# Patient Record
Sex: Female | Born: 1935
Health system: Southern US, Community
[De-identification: ages and names within clinical notes are randomized; demographics above are authoritative.]

## PROBLEM LIST (undated history)

## (undated) DIAGNOSIS — I8390 Asymptomatic varicose veins of unspecified lower extremity: Secondary | ICD-10-CM

## (undated) DIAGNOSIS — C50912 Malignant neoplasm of unspecified site of left female breast: Secondary | ICD-10-CM

## (undated) DIAGNOSIS — Z87891 Personal history of nicotine dependence: Secondary | ICD-10-CM

## (undated) DIAGNOSIS — C4491 Basal cell carcinoma of skin, unspecified: Secondary | ICD-10-CM

## (undated) HISTORY — PX: DILATION AND CURETTAGE OF UTERUS: SHX78

## (undated) HISTORY — PX: CATARACT EXTRACTION W/ INTRAOCULAR LENS  IMPLANT, BILATERAL: SHX1307

## (undated) HISTORY — PX: FRACTURE SURGERY: SHX138

## (undated) HISTORY — PX: MOHS SURGERY: SUR867

---

## 1998-01-29 ENCOUNTER — Encounter: Admission: RE | Admit: 1998-01-29 | Discharge: 1998-04-29 | Payer: Self-pay | Admitting: Orthopedic Surgery

## 2001-07-11 ENCOUNTER — Other Ambulatory Visit: Admission: RE | Admit: 2001-07-11 | Discharge: 2001-07-11 | Payer: Self-pay | Admitting: Family Medicine

## 2014-08-06 ENCOUNTER — Ambulatory Visit (INDEPENDENT_AMBULATORY_CARE_PROVIDER_SITE_OTHER): Payer: Medicare Other | Admitting: Family Medicine

## 2014-08-06 VITALS — BP 132/84 | HR 72 | Temp 98.6°F | Resp 18 | Ht 67.0 in | Wt 164.0 lb

## 2014-08-06 DIAGNOSIS — J029 Acute pharyngitis, unspecified: Secondary | ICD-10-CM

## 2014-08-06 DIAGNOSIS — J0120 Acute ethmoidal sinusitis, unspecified: Secondary | ICD-10-CM

## 2014-08-06 DIAGNOSIS — J040 Acute laryngitis: Secondary | ICD-10-CM

## 2014-08-06 MED ORDER — AMOXICILLIN 875 MG PO TABS: 875.0000 mg | ORAL_TABLET | Freq: Two times a day (BID) | ORAL | Status: DC

## 2014-08-06 NOTE — Progress Notes (Signed)
Subjective: Patient is complaining of a sore throat for the past 3 weeks. It is been low grade. Yesterday was a little worse. She does have a little postnasal drainage and cough. No nasal congestion or ear congestion. No fever. Husband is not sick.  Objective: Young looking 80 year old lady. TMs normal. Nose clear. Throat normal in appearance. Neck supple without significant nodes. Chest clear. Heart regular without murmurs. Mild hoarseness.  Assessment: Pharyngitis, probably related to a posterior sinusitis such as ethmoid sinusitis drainage.  Plan: Give her round of antibiotics. If not doing better may need a CT scan sinuses and/or further pharyngeal evaluation.

## 2014-08-06 NOTE — Patient Instructions (Signed)
Take Amoxicillin one twice daily  Take Claritin or Allegra(loratadine or fexofenadine) one daily  Drink plenty of fluids and get enough rest and do not overuse your voice  Return if worse or not improving

## 2014-12-09 DIAGNOSIS — H6123 Impacted cerumen, bilateral: Secondary | ICD-10-CM | POA: Diagnosis not present

## 2015-04-22 DIAGNOSIS — D225 Melanocytic nevi of trunk: Secondary | ICD-10-CM | POA: Diagnosis not present

## 2015-04-22 DIAGNOSIS — L821 Other seborrheic keratosis: Secondary | ICD-10-CM | POA: Diagnosis not present

## 2015-04-22 DIAGNOSIS — C44719 Basal cell carcinoma of skin of left lower limb, including hip: Secondary | ICD-10-CM | POA: Diagnosis not present

## 2015-04-22 DIAGNOSIS — Z85828 Personal history of other malignant neoplasm of skin: Secondary | ICD-10-CM | POA: Diagnosis not present

## 2015-08-19 DIAGNOSIS — Z961 Presence of intraocular lens: Secondary | ICD-10-CM | POA: Diagnosis not present

## 2016-01-27 ENCOUNTER — Observation Stay (HOSPITAL_COMMUNITY)
Admission: EM | Admit: 2016-01-27 | Discharge: 2016-01-29 | Disposition: A | Discharge status: Home / Self Care | Payer: Medicare Other | Attending: Student in an Organized Health Care Education/Training Program | Admitting: Student in an Organized Health Care Education/Training Program

## 2016-01-27 ENCOUNTER — Encounter (HOSPITAL_COMMUNITY): Payer: Self-pay | Admitting: Emergency Medicine

## 2016-01-27 ENCOUNTER — Emergency Department (HOSPITAL_COMMUNITY): Payer: Medicare Other

## 2016-01-27 DIAGNOSIS — S82841A Displaced bimalleolar fracture of right lower leg, initial encounter for closed fracture: Secondary | ICD-10-CM | POA: Diagnosis not present

## 2016-01-27 DIAGNOSIS — R739 Hyperglycemia, unspecified: Secondary | ICD-10-CM | POA: Insufficient documentation

## 2016-01-27 DIAGNOSIS — E559 Vitamin D deficiency, unspecified: Secondary | ICD-10-CM | POA: Insufficient documentation

## 2016-01-27 DIAGNOSIS — Z825 Family history of asthma and other chronic lower respiratory diseases: Secondary | ICD-10-CM | POA: Diagnosis not present

## 2016-01-27 DIAGNOSIS — Z9842 Cataract extraction status, left eye: Secondary | ICD-10-CM | POA: Insufficient documentation

## 2016-01-27 DIAGNOSIS — M25571 Pain in right ankle and joints of right foot: Secondary | ICD-10-CM | POA: Insufficient documentation

## 2016-01-27 DIAGNOSIS — Y9389 Activity, other specified: Secondary | ICD-10-CM | POA: Diagnosis not present

## 2016-01-27 DIAGNOSIS — Z9841 Cataract extraction status, right eye: Secondary | ICD-10-CM | POA: Insufficient documentation

## 2016-01-27 DIAGNOSIS — R0902 Hypoxemia: Secondary | ICD-10-CM | POA: Diagnosis not present

## 2016-01-27 DIAGNOSIS — S9301XA Subluxation of right ankle joint, initial encounter: Secondary | ICD-10-CM | POA: Diagnosis not present

## 2016-01-27 DIAGNOSIS — W108XXA Fall (on) (from) other stairs and steps, initial encounter: Secondary | ICD-10-CM | POA: Diagnosis not present

## 2016-01-27 DIAGNOSIS — S82891A Other fracture of right lower leg, initial encounter for closed fracture: Secondary | ICD-10-CM | POA: Diagnosis present

## 2016-01-27 DIAGNOSIS — S82899A Other fracture of unspecified lower leg, initial encounter for closed fracture: Secondary | ICD-10-CM

## 2016-01-27 DIAGNOSIS — Z87891 Personal history of nicotine dependence: Secondary | ICD-10-CM | POA: Diagnosis not present

## 2016-01-27 DIAGNOSIS — Z85828 Personal history of other malignant neoplasm of skin: Secondary | ICD-10-CM | POA: Insufficient documentation

## 2016-01-27 DIAGNOSIS — Y92008 Other place in unspecified non-institutional (private) residence as the place of occurrence of the external cause: Secondary | ICD-10-CM | POA: Insufficient documentation

## 2016-01-27 HISTORY — DX: Personal history of nicotine dependence: Z87.891

## 2016-01-27 LAB — BASIC METABOLIC PANEL
Anion gap: 9 (ref 5–15)
BUN: 18 mg/dL (ref 6–20)
CALCIUM: 9.2 mg/dL (ref 8.9–10.3)
CHLORIDE: 109 mmol/L (ref 101–111)
CO2: 23 mmol/L (ref 22–32)
CREATININE: 0.83 mg/dL (ref 0.44–1.00)
Glucose, Bld: 119 mg/dL — ABNORMAL HIGH (ref 65–99)
Potassium: 4 mmol/L (ref 3.5–5.1)
SODIUM: 141 mmol/L (ref 135–145)

## 2016-01-27 LAB — CBC WITH DIFFERENTIAL/PLATELET
BASOS PCT: 0 %
Basophils Absolute: 0 10*3/uL (ref 0.0–0.1)
EOS ABS: 0.1 10*3/uL (ref 0.0–0.7)
EOS PCT: 1 %
HCT: 42.3 % (ref 36.0–46.0)
HEMOGLOBIN: 14.2 g/dL (ref 12.0–15.0)
LYMPHS ABS: 1.1 10*3/uL (ref 0.7–4.0)
Lymphocytes Relative: 15 %
MCH: 30.1 pg (ref 26.0–34.0)
MCHC: 33.6 g/dL (ref 30.0–36.0)
MCV: 89.8 fL (ref 78.0–100.0)
MONOS PCT: 9 %
Monocytes Absolute: 0.7 10*3/uL (ref 0.1–1.0)
NEUTROS PCT: 75 %
Neutro Abs: 5.2 10*3/uL (ref 1.7–7.7)
PLATELETS: 140 10*3/uL — AB (ref 150–400)
RBC: 4.71 MIL/uL (ref 3.87–5.11)
RDW: 12.6 % (ref 11.5–15.5)
WBC: 7.1 10*3/uL (ref 4.0–10.5)

## 2016-01-27 LAB — PROTIME-INR
INR: 1.04 (ref 0.00–1.49)
Prothrombin Time: 13.8 seconds (ref 11.6–15.2)

## 2016-01-27 LAB — HEPATIC FUNCTION PANEL
ALBUMIN: 3.9 g/dL (ref 3.5–5.0)
ALK PHOS: 58 U/L (ref 38–126)
ALT: 13 U/L — AB (ref 14–54)
AST: 24 U/L (ref 15–41)
BILIRUBIN INDIRECT: 0.6 mg/dL (ref 0.3–0.9)
BILIRUBIN TOTAL: 0.7 mg/dL (ref 0.3–1.2)
Bilirubin, Direct: 0.1 mg/dL (ref 0.1–0.5)
TOTAL PROTEIN: 6.7 g/dL (ref 6.5–8.1)

## 2016-01-27 LAB — SURGICAL PCR SCREEN
MRSA, PCR: NEGATIVE
STAPHYLOCOCCUS AUREUS: NEGATIVE

## 2016-01-27 LAB — MRSA PCR SCREENING: MRSA BY PCR: NEGATIVE

## 2016-01-27 LAB — TECHNOLOGIST SMEAR REVIEW

## 2016-01-27 LAB — APTT: APTT: 25 s (ref 24–37)

## 2016-01-27 LAB — MAGNESIUM: Magnesium: 2.1 mg/dL (ref 1.7–2.4)

## 2016-01-27 MED ORDER — FENTANYL CITRATE (PF) 100 MCG/2ML IJ SOLN
50.0000 ug | Freq: Once | INTRAMUSCULAR | Status: AC
  Administered 2016-01-27: 50 ug via INTRAVENOUS

## 2016-01-27 MED ORDER — ONDANSETRON HCL 4 MG/2ML IJ SOLN: 4.0000 mg | Freq: Four times a day (QID) | INTRAMUSCULAR | Status: DC | PRN

## 2016-01-27 MED ORDER — ENOXAPARIN SODIUM 40 MG/0.4ML ~~LOC~~ SOLN
40.0000 mg | Freq: Every day | SUBCUTANEOUS | Status: DC
  Administered 2016-01-27 – 2016-01-28 (×2): 40 mg via SUBCUTANEOUS
  Filled 2016-01-27 (×2): qty 0.4

## 2016-01-27 MED ORDER — CHOLECALCIFEROL 10 MCG (400 UNIT) PO TABS
800.0000 [IU] | ORAL_TABLET | Freq: Every day | ORAL | Status: DC
  Administered 2016-01-27 – 2016-01-29 (×3): 800 [IU] via ORAL
  Filled 2016-01-27 (×5): qty 2

## 2016-01-27 MED ORDER — MORPHINE SULFATE (PF) 2 MG/ML IV SOLN
1.0000 mg | INTRAVENOUS | Status: DC | PRN
  Administered 2016-01-28 (×2): 1 mg via INTRAVENOUS
  Filled 2016-01-27 (×2): qty 1

## 2016-01-27 MED ORDER — SODIUM CHLORIDE 0.9 % IV SOLN
INTRAVENOUS | Status: AC
  Administered 2016-01-27: 16:00:00 via INTRAVENOUS

## 2016-01-27 MED ORDER — ONDANSETRON HCL 4 MG/2ML IJ SOLN
4.0000 mg | Freq: Once | INTRAMUSCULAR | Status: AC
  Administered 2016-01-27: 4 mg via INTRAVENOUS
  Filled 2016-01-27: qty 2

## 2016-01-27 MED ORDER — ONDANSETRON HCL 4 MG/2ML IJ SOLN: 4.0000 mg | Freq: Three times a day (TID) | INTRAMUSCULAR | Status: DC | PRN

## 2016-01-27 MED ORDER — CHLORHEXIDINE GLUCONATE 4 % EX LIQD
60.0000 mL | Freq: Once | CUTANEOUS | Status: DC
  Filled 2016-01-27 (×2): qty 60

## 2016-01-27 MED ORDER — ONDANSETRON HCL 4 MG PO TABS: 4.0000 mg | ORAL_TABLET | Freq: Four times a day (QID) | ORAL | Status: DC | PRN

## 2016-01-27 MED ORDER — SENNOSIDES-DOCUSATE SODIUM 8.6-50 MG PO TABS
1.0000 | ORAL_TABLET | Freq: Every evening | ORAL | Status: DC | PRN
  Filled 2016-01-27: qty 1

## 2016-01-27 MED ORDER — CALCIUM CARBONATE 1250 (500 CA) MG PO TABS
1.0000 | ORAL_TABLET | Freq: Every day | ORAL | Status: DC
  Administered 2016-01-28 – 2016-01-29 (×2): 500 mg via ORAL
  Filled 2016-01-27 (×2): qty 1

## 2016-01-27 MED ORDER — ACETAMINOPHEN 650 MG RE SUPP: 650.0000 mg | Freq: Four times a day (QID) | RECTAL | Status: DC | PRN

## 2016-01-27 MED ORDER — ACETAMINOPHEN 325 MG PO TABS: 650.0000 mg | ORAL_TABLET | Freq: Four times a day (QID) | ORAL | Status: DC | PRN

## 2016-01-27 MED ORDER — MORPHINE SULFATE (PF) 2 MG/ML IV SOLN
1.0000 mg | INTRAVENOUS | Status: DC | PRN
  Administered 2016-01-27 (×2): 1 mg via INTRAVENOUS
  Filled 2016-01-27 (×2): qty 1

## 2016-01-27 MED ORDER — FENTANYL CITRATE (PF) 100 MCG/2ML IJ SOLN
50.0000 ug | Freq: Once | INTRAMUSCULAR | Status: AC
  Administered 2016-01-27: 50 ug via INTRAVENOUS
  Filled 2016-01-27: qty 2

## 2016-01-27 MED ORDER — DEXTROSE-NACL 5-0.45 % IV SOLN
100.0000 mL/h | INTRAVENOUS | Status: DC
  Administered 2016-01-27: 100 mL/h via INTRAVENOUS

## 2016-01-27 NOTE — Consult Note (Signed)
She has a bimal ankle fracture on the Right  Tentative plan is for elevation, NWB, allow swelling to subside then ORIF as inpatient on 4/4 (tuesday)  Formal consult to follow  MURPHY, TIMOTHY D

## 2016-01-27 NOTE — Progress Notes (Addendum)
Pt states that she has not spoken with the doctor regarding her surgery tomorrow and she has no idea what they surgery entails. Will sign consent after being informed. Will continue to monitor  01/28/16 0447- pt surgery is not until 4/4. Spoke to pt about the plan. Spoke with pharmacy to sign and hold hibiclens that was released earlier. Will continue to monitor

## 2016-01-27 NOTE — ED Provider Notes (Signed)
CSN: [id number]     Arrival date & time 01/27/16  [id number] History   First MD Initiated Contact with Patient 01/27/16 [phone]     Chief Complaint  Patient presents with  . Ankle Injury      HPI Patient tripped coming down some steps and suffered injury to her right ankle.  Her chief complaint right ankle pain and deformity.  Patient has no significant past medical history. History reviewed. No pertinent past medical history. Past Surgical History  Procedure Laterality Date  . Cataract extraction      both eyes   No family history on file. Social History  Substance Use Topics  . Smoking status: Never Smoker   . Smokeless tobacco: None  . Alcohol Use: No   OB History    No data available     Review of Systems  All other systems reviewed and are negative  Allergies  Review of patient's allergies indicates no known allergies.  Home Medications   Prior to Admission medications   Not on File   BP 120/70 mmHg  Pulse 104  Temp(Src) 98.8 F (37.1 C) (Oral)  Resp 16  SpO2 84% Physical Exam  Constitutional: She is oriented to person, place, and time. She appears well-developed and well-nourished. No distress.  HENT:  Head: Normocephalic and atraumatic.  Eyes: Pupils are equal, round, and reactive to light.  Neck: Normal range of motion.  Cardiovascular: Normal rate and intact distal pulses.   Pulmonary/Chest: No respiratory distress.  Abdominal: Normal appearance. She exhibits no distension.  Musculoskeletal: She exhibits tenderness.       Right ankle: She exhibits decreased range of motion, swelling and deformity. She exhibits normal pulse.  Neurological: She is alert and oriented to person, place, and time. No cranial nerve deficit.  Skin: Skin is warm and dry. No rash noted.  Psychiatric: She has a normal mood and affect. Her behavior is normal.  Nursing note and vitals reviewed.   ED Course  Reduction of fracture Date/Time: 01/27/2016 11:37 AM Performed by: Leonard Schwartz Authorized by: Leonard Schwartz Consent: The procedure was performed in an emergent situation. Verbal consent obtained. Written consent not obtained. Risks and benefits: risks, benefits and alternatives were discussed Consent given by: patient Patient identity confirmed: verbally with patient Local anesthesia used: no Patient sedated: no Patient tolerance: Patient tolerated the procedure well with no immediate complications   (including critical care time)  Labs Review Labs Reviewed  CBC WITH DIFFERENTIAL/PLATELET - Abnormal; Notable for the following:    Platelets 140 (*)    All other components within normal limits  BASIC METABOLIC PANEL - Abnormal; Notable for the following:    Glucose, Bld 119 (*)    All other components within normal limits  HEPATIC FUNCTION PANEL - Abnormal; Notable for the following:    ALT 13 (*)    All other components within normal limits  MRSA PCR SCREENING  MAGNESIUM  PROTIME-INR  APTT  HIV ANTIBODY (ROUTINE TESTING)  HEPATITIS C ANTIBODY (REFLEX)  TECHNOLOGIST SMEAR REVIEW  VITAMIN D 25 HYDROXY (VIT D DEFICIENCY, FRACTURES)    Imaging Review Dg Ankle 2 Views Right  01/27/2016  CLINICAL DATA:  Post reduction of the tibia and fibula fractures. EXAM: RIGHT ANKLE - 2 VIEW COMPARISON:  01/27/2016 FINDINGS: Right ankle is within a splint or cast. Improved alignment of the ankle on the lateral view. There continues to be widening along the medial ankle joint on the AP view suggesting lateral subluxation of the talus.  Again noted is a displaced fracture involving the medial malleolus and displaced fracture of the distal fibula. IMPRESSION: Fractures of the distal tibia and fibula. Improved alignment of the ankle but there is persistent lateral subluxation of the ankle joint with widening along the medial aspect of the ankle joint. Electronically Signed   By: Markus Daft M.D.   On: 01/27/2016 11:34   Dg Ankle Complete Right  01/27/2016  CLINICAL DATA:   [place] this morning, swelling, deformity the right ankle EXAM: RIGHT ANKLE - COMPLETE 3+ VIEW COMPARISON:  None. FINDINGS: There is a transverse fracture through the medial malleolus. Displaced oblique fracture through the distal fibula. Disruption of the ankle mortise with widening medially and anteriorly and subluxation of the talus posteriorly relative to the distal tibia. Diffuse soft tissue swelling. IMPRESSION: Displaced bimalleolar fracture with subluxation at the tibiotalar joint and disruption of the ankle mortise. Electronically Signed   By: Rolm Baptise M.D.   On: 01/27/2016 09:25   I have personally reviewed and evaluated these images and lab results as part of my medical decision-making.   EKG Interpretation   Date/Time:  Wednesday January 27 2016 11:36:14 EDT Ventricular Rate:  64 PR Interval:  174 QRS Duration: 89 QT Interval:  412 QTC Calculation: 425 R Axis:   39 Text Interpretation:  Sinus rhythm Borderline low voltage, extremity leads  Confirmed by Audie Pinto  MD, Bobbi Yount ([id number]) on 01/27/2016 11:39:42 AM     Discussed with Dr. Percell Miller from orthopedics.  He will see the patient later this evening.  Patient will be admitted to medicine. MDM   Final diagnoses:  Ankle fracture        Leonard Schwartz, MD 01/27/16 [phone]

## 2016-01-27 NOTE — H&P (Signed)
Date: 01/27/2016               Patient Name:  Jessica Glenn MRN: [id number]  DOB: [birth year] Age / Sex: 82 y.o., female   PCP: No primary care provider on file.         Medical Service: Internal Medicine Teaching Service         Attending Physician: Dr. Axel Filler, MD    First Contact: Dr. Merrilyn Puma Pager: [phone]  Second Contact: Dr. Hulen Luster Pager: [phone]       After Hours (After 5p/  First Contact Pager: [phone]  weekends / holidays): Second Contact Pager: [phone]   Chief Complaint: fall with right ankle fracture   History of Present Illness:   Jessica Glenn is a very pleasant 82 year old woman with basal cell skin carcinoma s/p Moh's and past tobacco use who presents with fall and right ankle fracture.   She reports feeling in her normal state of health when she was going down the steps to her garage where it was dark and missed the last step and fell. She denies feeling lightheaded or dizzy and has no problems with balance at baseline. She denies prior history of fall, fracture, or osteoporosis. She takes a mulivitamin and other dietary supplements which she is unclear of. She does not have a primary care doctor and denies chronic medical problems. She is very functional at baseline and able to perform ADL's on her own. She can walk up a flight of stairs with no difficultly and does not become short of breath or have chest pain with walking. She denies orthopnea, PND, or lower LE edema. She denies history of cardiac or pulmonary disease. She has never had echo. Her 12-lead EKG on admission was normal.   On admission to the ED xray of her right ankle revealed displaced bimalleolar fracture with subluxation at the tibiotalar joint and disruption of the ankle mortise. It was reduced by ED physician and splinted with repeat xray with improvement of alignment of the ankle but persistent lateral subluxation of the ankle joint with widening along the medial aspect of the ankle  joint. She was given fentanyl 100 mcg. She had brief desaturation to 84% requiring supplemental oxygen. She is to be seen by orthopedic surgery for possible surgery.    PSH: Cataract  FH: COPD (mother) SH: Past tobacco use Meds: Multivitamin, B12    Allergies: Allergies as of 01/27/2016  . (No Known Allergies)   History reviewed. No pertinent past medical history. Past Surgical History  Procedure Laterality Date  . Cataract extraction      both eyes   No family history on file. Social History   Social History  . Marital Status: Married    Spouse Name: N/A  . Number of Children: N/A  . Years of Education: N/A   Occupational History  . Not on file.   Social History Main Topics  . Smoking status: Never Smoker   . Smokeless tobacco: Not on file  . Alcohol Use: No  . Drug Use: Not on file  . Sexual Activity: Not on file   Other Topics Concern  . Not on file   Social History Narrative    Review of Systems: Review of Systems  Constitutional: Negative for fever and chills.  HENT: Negative for congestion.   Eyes: Negative for blurred vision.  Respiratory: Negative for cough, shortness of breath and wheezing.   Cardiovascular: Negative for chest pain and leg swelling.  Gastrointestinal:  Negative for nausea, vomiting, abdominal pain, diarrhea, constipation and blood in stool.  Genitourinary: Negative for dysuria, urgency, frequency and hematuria.  Musculoskeletal: Positive for joint pain (right ankle) and falls (no prior history ).  Skin: Negative for rash.  Neurological: Negative for dizziness, focal weakness and headaches.  Psychiatric/Behavioral: Negative for memory loss and substance abuse.      Physical Exam: Blood pressure 115/94, pulse 66, temperature 98.8 F (37.1 C), temperature source Oral, resp. rate 18, SpO2 100 %.   Physical Exam  Constitutional: She is oriented to person, place, and time. She appears well-developed and well-nourished. No  distress.  HENT:  Head: Normocephalic and atraumatic.  Right Ear: External ear normal.  Left Ear: External ear normal.  Nose: Nose normal.  Mouth/Throat: Oropharynx is clear and moist. No oropharyngeal exudate.  Eyes: Conjunctivae and EOM are normal. Pupils are equal, round, and reactive to light. Right eye exhibits no discharge. Left eye exhibits no discharge. No scleral icterus.  Neck: Normal range of motion. Neck supple.  Cardiovascular: Normal rate, regular rhythm and normal heart sounds.   Pulmonary/Chest: Effort normal and breath sounds normal. No respiratory distress. She has no wheezes. She has no rales.  Abdominal: Soft. Bowel sounds are normal. She exhibits no distension. There is no tenderness. There is no rebound and no guarding.  Musculoskeletal: She exhibits no edema.  Right foot up to knee in splint. Normal 5/5 muscle strength of left LE with normal sensation to light touch.   Neurological: She is alert and oriented to person, place, and time.  Skin: Skin is warm and dry. No rash noted. She is not diaphoretic. No erythema. No pallor.  [place] above left eye  Psychiatric: She has a normal mood and affect. Her behavior is normal. Judgment and thought content normal.    Lab results: Basic Metabolic Panel:  Recent Labs  01/27/16 1000  NA 141  K 4.0  CL 109  CO2 23  GLUCOSE 119*  BUN 18  CREATININE 0.83  CALCIUM 9.2   CBC:  Recent Labs  01/27/16 1000  WBC 7.1  NEUTROABS 5.2  HGB 14.2  HCT 42.3  MCV 89.8  PLT 140*    Imaging results:  Dg Ankle 2 Views Right  01/27/2016  CLINICAL DATA:  Post reduction of the tibia and fibula fractures. EXAM: RIGHT ANKLE - 2 VIEW COMPARISON:  01/27/2016 FINDINGS: Right ankle is within a splint or cast. Improved alignment of the ankle on the lateral view. There continues to be widening along the medial ankle joint on the AP view suggesting lateral subluxation of the talus. Again noted is a displaced fracture involving the medial  malleolus and displaced fracture of the distal fibula. IMPRESSION: Fractures of the distal tibia and fibula. Improved alignment of the ankle but there is persistent lateral subluxation of the ankle joint with widening along the medial aspect of the ankle joint. Electronically Signed   By: Markus Daft M.D.   On: 01/27/2016 11:34   Dg Ankle Complete Right  01/27/2016  CLINICAL DATA:  [place] this morning, swelling, deformity the right ankle EXAM: RIGHT ANKLE - COMPLETE 3+ VIEW COMPARISON:  None. FINDINGS: There is a transverse fracture through the medial malleolus. Displaced oblique fracture through the distal fibula. Disruption of the ankle mortise with widening medially and anteriorly and subluxation of the talus posteriorly relative to the distal tibia. Diffuse soft tissue swelling. IMPRESSION: Displaced bimalleolar fracture with subluxation at the tibiotalar joint and disruption of the ankle mortise. Electronically Signed  By: Rolm Baptise M.D.   On: 01/27/2016 09:25    Other results: EKG:   Vent. rate 64 BPM PR interval 174 ms QRS duration 89 ms QT/QTc 412/425 ms P-R-T axes 77 39 78  Sinus rhythm Borderline low voltage, extremity leads  Assessment & Plan by Problem:  Acute Right Bimalleolar and Distal tibia/fibular fracture - Pt with fall from 1 step in her garage found to have displaced bimalleolar fracture with subluxation at the tibiotalar joint and disruption of the ankle mortise. It was reduced by ED physician and splinted with repeat xray with improvement of alignment of the ankle but persistent lateral subluxation of the ankle joint with widening along the medial aspect of the ankle joint and distal tibia and fibula fractures. Her cardiac risk for pre-operative risk score is Class 1 with 0.4% risk of major cardiac risk. Etiology of fracture due to mechanical fall with possible osteoporosis due to advanced age.   -Strict bed rest  -Elevate right LE and maintain splint  -Orthopedic  surgery to see patient for possible surgery  -IV morphine 1 mg Q 4 hr PRN pain  -Tylenol 650 mg Q 6 hr PRN pain -Obtain 25-OH vitamin D level and start calcium-vitamin D 1250-800 mg daily  -Obtain PT and OT consults tomorrow for possible rehab  -SCD"s for DVT ppx for now if surgery planned then lovenox daily   Acute hypoxia in setting of narcotic use - Pt is narcotic naive and received fentanyl 100 mcg for pain then with brief episode of hypoxia to SpO2 of 84%.  -Oxygen therapy to keep SpO2 > 92% -Limit narcotics -No indication for chest xray at this time  Hyperglycemia - BG 119. Pt with no history of diabetes.  -Repeat BMP in AM -Consider A1c if fasting BG >126   Diet: NPO DVT ppx: SCD's Code: DNR confirmed   Dispo: Disposition is deferred at this time, awaiting improvement of current medical problems. Anticipated discharge in approximately 1-3 day(s).   The patient does not have a current PCP (No primary care provider on file.) and does need an [facility] hospital follow-up appointment after discharge.  The patient does have transportation limitations that hinder transportation to clinic appointments.  Signed: Juluis Mire, MD 01/27/2016, 2:31 PM

## 2016-01-27 NOTE — ED Notes (Signed)
MD at bedside.DR Audie Pinto

## 2016-01-27 NOTE — Progress Notes (Signed)
Orthopedic Tech Progress Note Patient Details:  Jessica Glenn 1928/12/03 [id number]  Ortho Devices Type of Ortho Device: Ace wrap, Post (short leg) splint, Stirrup splint Ortho Device/Splint Location: rle Ortho Device/Splint Interventions: Application As ordered by Dr. Cammy Brochure, Meril Dray 01/27/2016, 10:50 AM

## 2016-01-27 NOTE — Progress Notes (Signed)
Jessica Glenn [id number] Admission Data: 01/27/2016 6:36 PM Attending Provider: Axel Filler, MD  PCP:No primary care provider on file. Consults/ Treatment Team: Treatment Team:  Jessica Butters, MD  Jessica Glenn is a 82 y.o. female patient admitted from ED awake, alert  & orientated  X 3,  DNR, VSS - Blood pressure 146/70, pulse 79, temperature 98.6 F (37 C), temperature source Oral, resp. rate 19, SpO2 98 %., Room air, no c/o shortness of breath, no c/o chest pain, no distress noted.    IV site WDL: right hand with a transparent dsg that's clean dry and intact. NS at 125 ml/h  Allergies:  No Known Allergies   Past Medical History  Diagnosis Date  . History of tobacco use   . Bilateral cataracts     s/p surgery  . Hx of skin cancer, basal cell     s/p Moh's surgery    Pt orientation to unit, room and routine. Information packet given to patient/family and safety video watched.  Admission INP armband ID verified with patient/family, and in place. SR up x 2, fall risk assessment complete with Patient and family verbalizing understanding of risks associated with falls. Pt verbalizes an understanding of how to use the call bell and to call for help before getting out of bed.   Will cont to monitor and assist as needed.  Jessica Fray, RN 01/27/2016 6:38 PM

## 2016-01-27 NOTE — Consult Note (Signed)
   ORTHOPAEDIC CONSULTATION  REQUESTING PHYSICIAN: Duncan Thomas Vincent, MD  Chief Complaint: right ankle bimal fracture  HPI: Jessica Glenn is a 82 y.o. female who complains of a mechanical fall yesterday  Past Medical History  Diagnosis Date  . History of tobacco use   . Bilateral cataracts     s/p surgery  . Hx of skin cancer, basal cell     s/p Moh's surgery    Past Surgical History  Procedure Laterality Date  . Cataract extraction      both eyes   Social History   Social History  . Marital Status: Married    Spouse Name: N/A  . Number of Children: N/A  . Years of Education: N/A   Social History Main Topics  . Smoking status: Former Smoker  . Smokeless tobacco: None  . Alcohol Use: No  . Drug Use: None  . Sexual Activity: Not Asked   Other Topics Concern  . None   Social History Narrative   Family History  Problem Relation Age of Onset  . COPD Mother    No Known Allergies Prior to Admission medications   Not on File   Dg Ankle 2 Views Right  01/27/2016  CLINICAL DATA:  Post reduction of the tibia and fibula fractures. EXAM: RIGHT ANKLE - 2 VIEW COMPARISON:  01/27/2016 FINDINGS: Right ankle is within a splint or cast. Improved alignment of the ankle on the lateral view. There continues to be widening along the medial ankle joint on the AP view suggesting lateral subluxation of the talus. Again noted is a displaced fracture involving the medial malleolus and displaced fracture of the distal fibula. IMPRESSION: Fractures of the distal tibia and fibula. Improved alignment of the ankle but there is persistent lateral subluxation of the ankle joint with widening along the medial aspect of the ankle joint. Electronically Signed   By: Adam  Henn M.D.   On: 01/27/2016 11:34   Dg Ankle Complete Right  01/27/2016  CLINICAL DATA:  Fell this morning, swelling, deformity the right ankle EXAM: RIGHT ANKLE - COMPLETE 3+ VIEW COMPARISON:  None. FINDINGS: There is a  transverse fracture through the medial malleolus. Displaced oblique fracture through the distal fibula. Disruption of the ankle mortise with widening medially and anteriorly and subluxation of the talus posteriorly relative to the distal tibia. Diffuse soft tissue swelling. IMPRESSION: Displaced bimalleolar fracture with subluxation at the tibiotalar joint and disruption of the ankle mortise. Electronically Signed   By: Kevin  Dover M.D.   On: 01/27/2016 09:25    Positive ROS: All other systems have been reviewed and were otherwise negative with the exception of those mentioned in the HPI and as above.  Labs cbc  Recent Labs  01/27/16 1000  WBC 7.1  HGB 14.2  HCT 42.3  PLT 140*    Labs inflam No results for input(s): CRP in the last 72 hours.  Invalid input(s): ESR  Labs coag  Recent Labs  01/27/16 1504  INR 1.04     Recent Labs  01/27/16 1000  NA 141  K 4.0  CL 109  CO2 23  GLUCOSE 119*  BUN 18  CREATININE 0.83  CALCIUM 9.2    Physical Exam: Filed Vitals:   01/27/16 1800 01/27/16 1830  BP: 143/74 146/70  Pulse: 69 79  Temp:  98.6 F (37 C)  Resp: 16 19   General: Alert, no acute distress Cardiovascular: No pedal edema Respiratory: No cyanosis, no use of accessory musculature   GI: No organomegaly, abdomen is soft and non-tender Skin: No lesions in the area of chief complaint other than those listed below in MSK exam.  Neurologic: Sensation intact distally save for the below mentioned MSK exam Psychiatric: Patient is competent for consent with normal mood and affect Lymphatic: No axillary or cervical lymphadenopathy  MUSCULOSKELETAL:  RLE: compartments soft, NVI Other extremities are atraumatic with painless ROM and NVI.  Assessment: R bimal fracture  Plan: NWB Elevate OR for ORIF when swelling subsides    MURPHY, TIMOTHY D, MD Cell [phone]   01/27/2016 6:32 PM     

## 2016-01-27 NOTE — ED Notes (Signed)
Pt reports that she was coming down the steps and missed a step injuring her right ankle. Pt alert x4. Pt denies LOC.

## 2016-01-28 DIAGNOSIS — S82891A Other fracture of right lower leg, initial encounter for closed fracture: Secondary | ICD-10-CM

## 2016-01-28 DIAGNOSIS — W109XXA Fall (on) (from) unspecified stairs and steps, initial encounter: Secondary | ICD-10-CM | POA: Diagnosis not present

## 2016-01-28 DIAGNOSIS — S82841A Displaced bimalleolar fracture of right lower leg, initial encounter for closed fracture: Secondary | ICD-10-CM | POA: Diagnosis not present

## 2016-01-28 DIAGNOSIS — E559 Vitamin D deficiency, unspecified: Secondary | ICD-10-CM | POA: Diagnosis present

## 2016-01-28 LAB — BASIC METABOLIC PANEL
ANION GAP: 6 (ref 5–15)
BUN: 18 mg/dL (ref 6–20)
CALCIUM: 8.4 mg/dL — AB (ref 8.9–10.3)
CHLORIDE: 108 mmol/L (ref 101–111)
CO2: 26 mmol/L (ref 22–32)
Creatinine, Ser: 0.95 mg/dL (ref 0.44–1.00)
GFR calc non Af Amer: 53 mL/min — ABNORMAL LOW (ref >60)
GLUCOSE: 126 mg/dL — AB (ref 65–99)
Potassium: 4.5 mmol/L (ref 3.5–5.1)
Sodium: 140 mmol/L (ref 135–145)

## 2016-01-28 LAB — VITAMIN D 25 HYDROXY (VIT D DEFICIENCY, FRACTURES): VIT D 25 HYDROXY: 23.6 ng/mL — AB (ref 30.0–100.0)

## 2016-01-28 LAB — CBC WITH DIFFERENTIAL/PLATELET
BASOS ABS: 0 10*3/uL (ref 0.0–0.1)
Basophils Relative: 0 %
Eosinophils Absolute: 0.1 10*3/uL (ref 0.0–0.7)
Eosinophils Relative: 1 %
HEMATOCRIT: 36.5 % (ref 36.0–46.0)
HEMOGLOBIN: 12.2 g/dL (ref 12.0–15.0)
LYMPHS PCT: 25 %
Lymphs Abs: 1.7 10*3/uL (ref 0.7–4.0)
MCH: 30.7 pg (ref 26.0–34.0)
MCHC: 33.4 g/dL (ref 30.0–36.0)
MCV: 91.7 fL (ref 78.0–100.0)
MONO ABS: 0.8 10*3/uL (ref 0.1–1.0)
MONOS PCT: 12 %
NEUTROS ABS: 4.2 10*3/uL (ref 1.7–7.7)
NEUTROS PCT: 62 %
Platelets: 124 10*3/uL — ABNORMAL LOW (ref 150–400)
RBC: 3.98 MIL/uL (ref 3.87–5.11)
RDW: 12.9 % (ref 11.5–15.5)
WBC: 6.8 10*3/uL (ref 4.0–10.5)

## 2016-01-28 LAB — HCV COMMENT:

## 2016-01-28 LAB — HIV ANTIBODY (ROUTINE TESTING W REFLEX): HIV Screen 4th Generation wRfx: NONREACTIVE

## 2016-01-28 LAB — HEPATITIS C ANTIBODY (REFLEX): HCV Ab: <0.1 s/co ratio (ref 0.0–0.9)

## 2016-01-28 MED ORDER — MORPHINE SULFATE (PF) 2 MG/ML IV SOLN
1.0000 mg | INTRAVENOUS | Status: DC | PRN
  Administered 2016-01-28 (×3): 1 mg via INTRAVENOUS
  Filled 2016-01-28 (×2): qty 1

## 2016-01-28 MED ORDER — MORPHINE SULFATE (PF) 2 MG/ML IV SOLN
1.0000 mg | INTRAVENOUS | Status: DC | PRN
  Filled 2016-01-28: qty 1

## 2016-01-28 MED ORDER — SODIUM CHLORIDE 0.9 % IV SOLN
INTRAVENOUS | Status: DC
  Administered 2016-01-28 – 2016-01-29 (×4): via INTRAVENOUS

## 2016-01-28 NOTE — Evaluation (Addendum)
Physical Therapy Evaluation Patient Details Name: Jessica Glenn MRN: [id number] DOB: [birth year] Today's Date: 01/28/2016   History of Present Illness  Pt admitted after falling with R distal tibia and fibula fractures. Tentative plan for surgery 02/02/16 after swelling subsides.  Clinical Impression  Patient presents with decreased mobility due to deficits listed in PT problem list below.  Mostly limited by pain (her biggest concern about going home,) and NWB status.  Feel continued skilled PT in the acute setting will assist with plans for d/c home with spouse supervision only.  May go home prior to surgery so recommend DME as noted below.  Feel follow up PT best either initially upon d/c (ASAP) or just wait till after surgery.    Follow Up Recommendations Home health PT    Equipment Recommendations  Rolling walker with 5" wheels;Wheelchair (measurements PT);Wheelchair cushion (measurements PT);3in1 (PT) (with ELR's)    Recommendations for Other Services       Precautions / Restrictions Precautions Precautions: Fall Restrictions RLE Weight Bearing: Non weight bearing      Mobility  Bed Mobility Overal bed mobility: Needs Assistance Bed Mobility: Supine to Sit     Supine to sit: Supervision     General bed mobility comments: HOB up, use of rail, increased time  Transfers Overall transfer level: Needs assistance Equipment used: Rolling walker (2 wheeled) Transfers: Sit to/from [facility] Sit to Stand: Min assist Stand pivot transfers: Min assist       General transfer comment: cues for technique, assist for up off bed, and to steady wtih stand "hop" to chair  Ambulation/Gait                Stairs            Wheelchair Mobility    Modified Rankin (Stroke Patients Only)       Balance Overall balance assessment: Needs assistance   Sitting balance-Leahy Scale: Good       Standing balance-Leahy Scale: Poor Standing balance comment:  UE support needed due to NWB                             Pertinent Vitals/Pain Faces Pain Scale: Hurts little more Pain Location: R ankle w/dependency Pain Descriptors / Indicators: Aching Pain Intervention(s): Limited activity within patient's tolerance;Monitored during session;Repositioned    Home Living Family/patient expects to be discharged to:: Private residence Living Arrangements: Spouse/significant other Available Help at Discharge: Family;Available 24 hours/day (husband has lifting restriction) Type of Home: House Home Access: Stairs to enter   [employer] of Steps: son building a ramp Home Layout: One level Home Equipment: None      Prior Function Level of Independence: Independent               Hand Dominance        Extremity/Trunk Assessment   Upper Extremity Assessment: Defer to OT evaluation           Lower Extremity Assessment: RLE deficits/detail RLE Deficits / Details: lifts antigravity, but strength NT due to pain, wiggles toes and can feel them fine       Communication   Communication: No difficulties  Cognition Arousal/Alertness: Awake/alert Behavior During Therapy: WFL for tasks assessed/performed Overall Cognitive Status: Within Functional Limits for tasks assessed                      General Comments General comments (skin integrity, edema, etc.):  spouse in room and helpful, but on lifting restricition for another few days after R cataract surgery    Exercises        Assessment/Plan    PT Assessment Patient needs continued PT services  PT Diagnosis Difficulty walking;Generalized weakness   PT Problem List Decreased strength;Decreased balance;Decreased mobility;Decreased activity tolerance;Pain;Decreased knowledge of precautions;Decreased knowledge of use of DME;Decreased safety awareness  PT Treatment Interventions DME instruction;Gait training;Functional mobility training;Therapeutic  activities;Wheelchair mobility training;Therapeutic exercise;Balance training   PT Goals (Current goals can be found in the Care Plan section) Acute Rehab PT Goals Patient Stated Goal: get through her ankle surgery PT Goal Formulation: With patient Time For Goal Achievement: 02/04/16 Potential to Achieve Goals: Good Additional Goals Additional Goal #1: Patient to propel w/c 100' S and demonstrate independence with managing parts.    Frequency Min 3X/week   Barriers to discharge        Co-evaluation PT/OT/SLP Co-Evaluation/Treatment: Yes Reason for Co-Treatment: For patient/therapist safety PT goals addressed during session: Strengthening/ROM;Balance;Mobility/safety with mobility         End of Session Equipment Utilized During Treatment: Gait belt Activity Tolerance: Patient tolerated treatment well Patient left: in chair;with chair alarm set;with call bell/phone within reach;with family/visitor present      Functional Assessment Tool Used: Clinical Judgement Functional Limitation: Mobility: Walking and moving around Mobility: Walking and Moving Around Current Status [id number]): At least 40 percent but less than 60 percent impaired, limited or restricted Mobility: Walking and Moving Around Goal Status [phone]): At least 20 percent but less than 40 percent impaired, limited or restricted    Time: 1122-1146 PT Time Calculation (min) (ACUTE ONLY): 24 min   Charges:   PT Evaluation $PT Eval Moderate Complexity: 1 Procedure     PT G Codes:   PT G-Codes **NOT FOR INPATIENT CLASS** Functional Assessment Tool Used: Clinical Judgement Functional Limitation: Mobility: Walking and moving around Mobility: Walking and Moving Around Current Status [id number]): At least 40 percent but less than 60 percent impaired, limited or restricted Mobility: Walking and Moving Around Goal Status [phone]): At least 20 percent but less than 40 percent impaired, limited or restricted    Reginia Naas 01/28/2016, 4:26 PM  Magda Kiel, [place] 01/28/2016

## 2016-01-28 NOTE — Care Management Obs Status (Signed)
[place] NOTIFICATION   Patient Details  Name: Jessica Glenn MRN: [id number] Date of Birth: 02/16/1929   Medicare Observation Status Notification Given:  Yes    Braylyn Eye, Antony Haste, RN 01/28/2016, 11:00 AM

## 2016-01-28 NOTE — Evaluation (Signed)
Occupational Therapy Evaluation Patient Details Name: Jessica Glenn MRN: [id number] DOB: 1928/12/24 Today's Date: 01/28/2016    History of Present Illness Pt admitted after falling with R distal tibia and fibula fractures. Tentative plan for surgery 02/02/16 after swelling subsides.   Clinical Impression   Pt was independent prior to admission. Presents with R LE pain and impaired balance interfering with ability to perform ADL and mobility at her baseline. Pt currently requires IV medication for pain management. She needed minimum assistance to pivot from bed to chair with RW. Will follow acutely.    Follow Up Recommendations  Home health OT    Equipment Recommendations  3 in 1 bedside comode;Wheelchair (measurements OT);Wheelchair cushion (measurements OT) (elevating leg rests)    Recommendations for Other Services       Precautions / Restrictions Precautions Precautions: Fall Restrictions Weight Bearing Restrictions: Yes RLE Weight Bearing: Non weight bearing      Mobility Bed Mobility Overal bed mobility: Needs Assistance Bed Mobility: Supine to Sit     Supine to sit: Supervision     General bed mobility comments: HOB up, use of rail, increased time  Transfers Overall transfer level: Needs assistance Equipment used: Rolling walker (2 wheeled) Transfers: Sit to/from [facility] Sit to Stand: Min assist Stand pivot transfers: Min assist       General transfer comment: verbal cues for technique, pt able to maintain NWB status for transfer    Balance Overall balance assessment: Needs assistance Sitting-balance support: Feet supported Sitting balance-Leahy Scale: Good       Standing balance-Leahy Scale: Poor Standing balance comment: reliant on UEs                            ADL Overall ADL's : Needs assistance/impaired Eating/Feeding: Independent;Sitting   Grooming: Set up;Sitting   Upper Body Bathing: Set up;Sitting    Lower Body Bathing: Moderate assistance;Sit to/from stand   Upper Body Dressing : Set up;Sitting   Lower Body Dressing: Moderate assistance;Sit to/from stand   Toilet Transfer: Minimal assistance;Stand-pivot;RW Armed forces technical officer Details (indicate cue type and reason): simulated bed to chair Toileting- Clothing Manipulation and Hygiene: Minimal assistance;Sit to/from stand               Vision     Perception     Praxis      Pertinent Vitals/Pain Pain Assessment: Faces Faces Pain Scale: Hurts little more Pain Location: R LE Pain Descriptors / Indicators: Guarding Pain Intervention(s): Limited activity within patient's tolerance;Monitored during session;Premedicated before session;Repositioned     Hand Dominance Right   Extremity/Trunk Assessment Upper Extremity Assessment Upper Extremity Assessment: Overall WFL for tasks assessed   Lower Extremity Assessment Lower Extremity Assessment: Defer to PT evaluation       Communication Communication Communication: No difficulties   Cognition Arousal/Alertness: Awake/alert Behavior During Therapy: WFL for tasks assessed/performed Overall Cognitive Status: Within Functional Limits for tasks assessed                     General Comments       Exercises       Shoulder Instructions      Home Living Family/patient expects to be discharged to:: Private residence Living Arrangements: Spouse/significant other Available Help at Discharge: Family;Available 24 hours/day (husband has lifting restriction ) Type of Home: House Home Access: Stairs to enter (family is building a ramp)     Home Layout: One level  Bathroom Shower/Tub: Occupational psychologist: Standard     Home Equipment: None          Prior Functioning/Environment Level of Independence: Independent             OT Diagnosis: Generalized weakness;Acute pain   OT Problem List: Decreased activity tolerance;Impaired balance  (sitting and/or standing);Decreased strength;Decreased knowledge of use of DME or AE;Decreased knowledge of precautions;Pain   OT Treatment/Interventions: Self-care/ADL training;DME and/or AE instruction;Patient/family education    OT Goals(Current goals can be found in the care plan section) Acute Rehab OT Goals Patient Stated Goal: get through her ankle surgery OT Goal Formulation: With patient Time For Goal Achievement: 02/04/16 Potential to Achieve Goals: Good ADL Goals Pt Will Perform Grooming: with supervision;standing Pt Will Perform Lower Body Bathing: with supervision;sit to/from stand Pt Will Perform Lower Body Dressing: with supervision;sit to/from stand Pt Will Transfer to Toilet: with supervision;ambulating;bedside commode (over toilet) Pt Will Perform Toileting - Clothing Manipulation and hygiene: with supervision;sit to/from stand Pt Will Perform Tub/Shower Transfer: Shower transfer;with min guard assist;anterior/posterior transfer;ambulating;3 in 1;rolling walker Additional ADL Goal #1: Pt will adhere to NWB status on R LE during ADL and ADL transfers.  OT Frequency: Min 2X/week   Barriers to D/C: Decreased caregiver support  husband cannot provide physical assistance       Co-evaluation PT/OT/SLP Co-Evaluation/Treatment: Yes Reason for Co-Treatment: For patient/therapist safety          End of Session Equipment Utilized During Treatment: Gait belt;Rolling walker Nurse Communication: Mobility status  Activity Tolerance: Patient tolerated treatment well Patient left: in chair;with call bell/phone within reach;with chair alarm set   Time: [id number] OT Time Calculation (min): 24 min Charges:  OT General Charges $OT Visit: 1 Procedure OT Evaluation $OT Eval Low Complexity: 1 Procedure G-Codes: OT G-codes **NOT FOR INPATIENT CLASS** Functional Assessment Tool Used: clinical judgement Functional Limitation: Self care Self Care Current Status [id number]): At least  40 percent but less than 60 percent impaired, limited or restricted Self Care Goal Status [id number]): At least 1 percent but less than 20 percent impaired, limited or restricted Self Care Discharge Status [phone]): At least 40 percent but less than 60 percent impaired, limited or restricted  [name] 01/28/2016, 1:38 PM  [phone]

## 2016-01-28 NOTE — Care Management Note (Addendum)
Case Management Note  Patient Details  Name: SHAKEYLA TOKI MRN: [id number] Date of Birth: 23-Feb-1929  Subjective/Objective:   82 y.o. F admitted with Ankle Fx, Swelling is preventing surgery at this time and surgery is not scheduled until 02/02/2016 tentatively...Marland KitchenMarland Kitchenthe patient and spouse are aware that she is admitted in an OBS status and that [facility] Medicare may not approve 5 day stay pre-op. Will continue to investigate options for this pleasant couple. Pt is NWB on RLE .Marland KitchenMarland KitchenCM explained that transportation home could be arranged via [facility] and  Assistance from [place], family  Etc could be utilized.               Action/Plan: Will consult with Internal Medicine for disposition/planning   Expected Discharge Date:                  Expected Discharge Plan:     In-House Referral:     Discharge planning Services     Post Acute Care Choice:    Choice offered to:     DME Arranged:    DME Agency:     HH Arranged:    [place] Agency:     Status of Service:     Medicare Important Message Given:    Date Medicare IM Given:    Medicare IM give by:    Date Additional Medicare IM Given:    Additional Medicare Important Message give by:     If discussed at [place] of Stay Meetings, dates discussed:    Additional Comments:  Delrae Sawyers, RN 01/28/2016, 2:10 PM

## 2016-01-28 NOTE — Progress Notes (Signed)
OT Cancellation Note  Patient Details Name: Jessica Glenn MRN: [id number] DOB: 1929-06-15   Cancelled Treatment:    Reason Eval/Treat Not Completed: Medical issues which prohibited therapy (Pt with bed rest orders. )  [name] 01/28/2016, 10:31 AM

## 2016-01-28 NOTE — Progress Notes (Signed)
   Subjective: Mrs. Pulido had no acute events overnight. Her ankle pain is well controlled with morphine. She is resting comfortably in bed this morning.  Objective: Vital signs in last 24 hours: Filed Vitals:   01/27/16 1830 01/27/16 2113 01/27/16 2306 01/28/16 0452  BP: 146/70 115/40 107/44 109/48  Pulse: 79 42 72 73  Temp: 98.6 F (37 C)  99.1 F (37.3 C) 99 F (37.2 C)  TempSrc: Oral Oral Oral Oral  Resp: 19 18 17 16   Weight:    162 lb 7.7 oz (73.7 kg)  SpO2: 98% 88% 93% 93%    Gen: Well-appearing, alert and oriented to person, place, and time CV: Normal rate, regular rhythm, no murmurs, rubs, or gallops Pulmonary: Normal effort, CTA bilaterally, no crackles or wheezes Abdominal: Soft, non-tender, non-distended, without rebound, guarding, or masses Extremities: Distal pulses 2+ in upper and lower extremities bilaterally. R ankle wrapped.  Lab Results Studies/Results: Dg Ankle 2 Views Right  01/27/2016  CLINICAL DATA:  Post reduction of the tibia and fibula fractures. EXAM: RIGHT ANKLE - 2 VIEW COMPARISON:  01/27/2016 FINDINGS: Right ankle is within a splint or cast. Improved alignment of the ankle on the lateral view. There continues to be widening along the medial ankle joint on the AP view suggesting lateral subluxation of the talus. Again noted is a displaced fracture involving the medial malleolus and displaced fracture of the distal fibula. IMPRESSION: Fractures of the distal tibia and fibula. Improved alignment of the ankle but there is persistent lateral subluxation of the ankle joint with widening along the medial aspect of the ankle joint. Electronically Signed   By: Markus Daft M.D.   On: 01/27/2016 11:34   Dg Ankle Complete Right  01/27/2016  CLINICAL DATA:  [place] this morning, swelling, deformity the right ankle EXAM: RIGHT ANKLE - COMPLETE 3+ VIEW COMPARISON:  None. FINDINGS: There is a transverse fracture through the medial malleolus. Displaced oblique fracture through  the distal fibula. Disruption of the ankle mortise with widening medially and anteriorly and subluxation of the talus posteriorly relative to the distal tibia. Diffuse soft tissue swelling. IMPRESSION: Displaced bimalleolar fracture with subluxation at the tibiotalar joint and disruption of the ankle mortise. Electronically Signed   By: Rolm Baptise M.D.   On: 01/27/2016 09:25   Assessment/Plan: 1. Right ankle fracture - after falling down 1 step. Almost certainly a fragility fracture, and as she is an elderly white woman she is at high risk for osteoporosis. However, with further discussion with the patient of possible prevention for further fractures, including bisphosphonates for her osteporosis, she declines these therapies and wishes to proceed with surgery only. Initially with significant swelling, with plans for ORIF inpatient as it improves -Ortho following; appreciate the thoughtful care of this mutual patient. Plan is ORIF once swelling subsides, tentatively 02/02/16 -Morphine 1mg  q 3 for pain, transition to oral as tolerated -PT/OT recs; NWB with maximal ankle elevation throughout the day -Declined osteoporosis treatment, including any bisphosphonates -Enoxaparin for DVT PPX -Senokot-S for OIC  Dispo: Disposition is deferred at this time, awaiting improvement of current medical problems.  Anticipated discharge in approximately 5-6 day(s).   The patient does not have a current PCP (No primary care provider on file.) and does need an [facility] hospital follow-up appointment after discharge.  The patient does not have transportation limitations that hinder transportation to clinic appointments.    Norval Gable, MD 01/28/2016, 12:23 PM

## 2016-01-28 NOTE — Care Management Note (Addendum)
Case Management Note  Patient Details  Name: Jessica Glenn MRN: [id number] Date of Birth: 02-08-1929  Subjective/Objective: pt reports she is  more than willing to be discharged home to wait for ORIF to be scheduled at earliest possible time. CM instructed pt to keep RLE elevated on at least 3 pillows at all times, and CM applied ICE to ANKLE. Instructed in the importance of both. She is more than willing to have DME delivered to hospital room in anticipation of discharge home. Made DME representative, Jermaine aware of need for W/C and 3n1.                    Action/Plan:Dr Merrilyn Puma aware of discharge plans for Friday 01/29/2016 and follow up with Ortho.  Dr Percell Miller would like for pt to make arrangements for outpt surgery enroute to home from hospital. CM will continue to follow for additional disposition/discharge needs.    Expected Discharge Date:                  Expected Discharge Plan:     In-House Referral:     Discharge planning Services     Post Acute Care Choice:    Choice offered to:     DME Arranged:    DME Agency:     HH Arranged:    [place] Agency:     Status of Service:     Medicare Important Message Given:    Date Medicare IM Given:    Medicare IM give by:    Date Additional Medicare IM Given:    Additional Medicare Important Message give by:     If discussed at [place] of Stay Meetings, dates discussed:    Additional Comments:  Delrae Sawyers, RN 01/28/2016, 3:31 PM

## 2016-01-28 NOTE — Progress Notes (Signed)
Spoke with Merrilyn Puma, MD Internal Medicine to discuss current care, plan of care and Observation Status,  who suggests CM speak with Percell Miller, MD and get his blessing on discharge home until ORIF can be scheduled. LM with answering service for Percell Miller to return call.

## 2016-01-29 ENCOUNTER — Encounter (HOSPITAL_BASED_OUTPATIENT_CLINIC_OR_DEPARTMENT_OTHER): Payer: Self-pay | Admitting: *Deleted

## 2016-01-29 DIAGNOSIS — S82841A Displaced bimalleolar fracture of right lower leg, initial encounter for closed fracture: Secondary | ICD-10-CM | POA: Diagnosis not present

## 2016-01-29 DIAGNOSIS — W109XXA Fall (on) (from) unspecified stairs and steps, initial encounter: Secondary | ICD-10-CM | POA: Diagnosis not present

## 2016-01-29 MED ORDER — RIVAROXABAN 10 MG PO TABS: 10.0000 mg | ORAL_TABLET | Freq: Every day | ORAL | Status: DC

## 2016-01-29 MED ORDER — TRAMADOL HCL 50 MG PO TABS: 50.0000 mg | ORAL_TABLET | Freq: Four times a day (QID) | ORAL | Status: DC | PRN

## 2016-01-29 NOTE — Progress Notes (Signed)
Physical Therapy Treatment Patient Details Name: Jessica Glenn MRN: [id number] DOB: [birth year] Today's Date: 01/29/2016    History of Present Illness Pt admitted after falling with R distal tibia and fibula fractures. Tentative plan for surgery 02/02/16 after swelling subsides.    PT Comments    Pt continues to make excellent progress and plans to return home prior to surgery.  Follow Up Recommendations  Home health PT     Equipment Recommendations  Rolling walker with 5" wheels;Wheelchair (measurements PT);Wheelchair cushion (measurements PT);3in1 (PT)    Recommendations for Other Services       Precautions / Restrictions Precautions Precautions: Fall Restrictions Weight Bearing Restrictions: Yes RLE Weight Bearing: Non weight bearing    Mobility  Bed Mobility Overal bed mobility: Modified Independent Bed Mobility: Supine to Sit;Sit to Supine     Supine to sit: Modified independent (Device/Increase time) Sit to supine: Modified independent (Device/Increase time)      Transfers Overall transfer level: Needs assistance Equipment used: Rolling walker (2 wheeled);None Transfers: Sit to/from Stand Sit to Stand: Supervision Stand pivot transfers: Supervision       General transfer comment: Initial cues for hand placement when coming to stand with walker.   Ambulation/Gait Ambulation/Gait assistance: Min guard Ambulation Distance (Feet): 45 Feet Assistive device: Rolling walker (2 wheeled) Gait Pattern/deviations: Step-to pattern Gait velocity: decr   General Gait Details: Able to maintain NWB with hop to gait. Assist for safety   Stairs            Wheelchair Mobility    Modified Rankin (Stroke Patients Only)       Balance Overall balance assessment: Needs assistance Sitting-balance support: No upper extremity supported;Feet supported Sitting balance-Leahy Scale: Normal     Standing balance support: Single extremity supported Standing  balance-Leahy Scale: Poor Standing balance comment: UE support needed due to NWB                    Cognition Arousal/Alertness: Awake/alert Behavior During Therapy: WFL for tasks assessed/performed Overall Cognitive Status: Within Functional Limits for tasks assessed                      Exercises      General Comments        Pertinent Vitals/Pain Pain Assessment: No/denies pain    Home Living Family/patient expects to be discharged to:: Private residence Living Arrangements: Spouse/significant other Available Help at Discharge: Family;Available 24 hours/day (husband has lifting restriction ) Type of Home: House Home Access: Stairs to enter (family is building a ramp)   Home Layout: One level Home Equipment: None      Prior Function Level of Independence: Independent          PT Goals (current goals can now be found in the care plan section) Acute Rehab PT Goals Patient Stated Goal: get through her ankle surgery Progress towards PT goals: Progressing toward goals    Frequency  Min 3X/week    PT Plan Current plan remains appropriate    Co-evaluation             End of Session Equipment Utilized During Treatment: Gait belt Activity Tolerance: Patient tolerated treatment well Patient left: in bed;with call bell/phone within reach;with bed alarm set;with family/visitor present     Time: [id number] PT Time Calculation (min) (ACUTE ONLY): 16 min  Charges:  $Gait Training: 8-22 mins  G Codes:      Jackson Coffield 01/29/2016, 10:50 AM [facility] PT [phone]

## 2016-01-29 NOTE — Progress Notes (Signed)
   Subjective: Mrs. Fatta had no acute events overnight. Her ankle pain is well controlled with morphine, and hasn't had to use any since yesterday. She is resting comfortably in bed this morning.  Objective: Vital signs in last 24 hours: Filed Vitals:   01/28/16 0452 01/28/16 1442 01/28/16 2156 01/29/16 0620  BP: 109/48 102/50 117/60 124/68  Pulse: 73 71 68 68  Temp: 99 F (37.2 C) 99.5 F (37.5 C) 99.5 F (37.5 C) 98.9 F (37.2 C)  TempSrc: Oral Oral Oral   Resp: 16 19 16 16   Weight: 162 lb 7.7 oz (73.7 kg)   164 lb 0.4 oz (74.4 kg)  SpO2: 93% 97% 93% 94%    Gen: Well-appearing, alert and oriented to person, place, and time CV: Normal rate, regular rhythm, no murmurs, rubs, or gallops Pulmonary: Normal effort, CTA bilaterally, no crackles or wheezes Abdominal: Soft, non-tender, non-distended, without rebound, guarding, or masses Extremities: Distal pulses 2+ in upper and lower extremities bilaterally. R ankle wrapped.  Lab Results Studies/Results: No results found. Assessment/Plan: 1. Right ankle fracture - after falling down 1 step. Almost certainly a fragility fracture, and as she is an elderly white woman she is at high risk for osteoporosis. However, with further discussion with the patient of possible prevention for further fractures, including bisphosphonates for her osteporosis, she declines these therapies and wishes to proceed with surgery only. Initially with significant swelling, with plans for ORIF outpatient as it improves. Will be sent home with devices and home health services today. -Ortho following; appreciate the thoughtful care of this mutual patient. Plan is ORIF once swelling subsides, tentatively 02/02/16 -Morphine 1mg  q 3 for pain, transition to oral as tolerated -PT/OT recs; NWB with maximal ankle elevation throughout the day -Declined osteoporosis treatment, including any bisphosphonates -Enoxaparin for DVT PPX -Senokot-S for OIC  Dispo: Disposition is  deferred at this time, awaiting improvement of current medical problems.  Anticipated discharge today.  The patient does not have a current PCP (No primary care provider on file.) and does need an [facility] hospital follow-up appointment after discharge.  The patient does not have transportation limitations that hinder transportation to clinic appointments.    Norval Gable, MD 01/29/2016, 12:00 PM

## 2016-01-29 NOTE — Discharge Summary (Signed)
Name: Jessica Glenn MRN: [id number] DOB: [birth year] 82 y.o. PCP: No primary care provider on file.  Date of Admission: 01/27/2016  8:58 AM Date of Discharge: 01/29/2016 Attending Physician: Axel Filler, MD  Discharge Diagnosis: 1. Right ankle fracture Principal Problem:   Ankle fracture, right Active Problems:   Hyperglycemia   Fall (on) (from) other stairs and steps, initial encounter   History of basal cell carcinoma of skin   Vitamin D insufficiency  Discharge Medications:   Medication List    TAKE these medications        rivaroxaban 10 MG Tabs tablet  Commonly known as:  XARELTO  Take 1 tablet (10 mg total) by mouth daily. Stop taking the day before your surgery.     traMADol 50 MG tablet  Commonly known as:  ULTRAM  Take 1 tablet (50 mg total) by mouth every 6 (six) hours as needed.        Disposition and follow-up:   Jessica Glenn was discharged from [facility] in Stable condition.  At the hospital follow up visit please address:  1.  Has patient made appointment with Dr. Debroah Loop office for outpatient surgery next week?  2.  Labs / imaging needed at time of follow-up: None  3.  Pending labs/ test needing follow-up: None  Follow-up Appointments:     Follow-up Information    Follow up with [facility].   Specialty:  Orthopedic Surgery   Why:  Stop by office and make appt for outpt Surgery with Fredonia Highland, MD   Contact information:   [place] [address] [phone]       Follow up with [facility].   Why:  W/C with Cushion and Elevated leg rest, 3n1 will be delivered to room.    Contact information:   [address] [place] [address] [phone]       Discharge Instructions: Discharge Instructions    Bed rest    Complete by:  As directed      Diet - low sodium heart healthy    Complete by:  As directed            Consultations:  Treatment Team:  [name], MD  Procedures Performed:  Dg Ankle 2 Views Right  01/27/2016  CLINICAL DATA:  Post reduction of the tibia and fibula fractures. EXAM: RIGHT ANKLE - 2 VIEW COMPARISON:  01/27/2016 FINDINGS: Right ankle is within a splint or cast. Improved alignment of the ankle on the lateral view. There continues to be widening along the medial ankle joint on the AP view suggesting lateral subluxation of the talus. Again noted is a displaced fracture involving the medial malleolus and displaced fracture of the distal fibula. IMPRESSION: Fractures of the distal tibia and fibula. Improved alignment of the ankle but there is persistent lateral subluxation of the ankle joint with widening along the medial aspect of the ankle joint. Electronically Signed   By: Markus Daft M.D.   On: 01/27/2016 11:34   Dg Ankle Complete Right  01/27/2016  CLINICAL DATA:  [place] this morning, swelling, deformity the right ankle EXAM: RIGHT ANKLE - COMPLETE 3+ VIEW COMPARISON:  None. FINDINGS: There is a transverse fracture through the medial malleolus. Displaced oblique fracture through the distal fibula. Disruption of the ankle mortise with widening medially and anteriorly and subluxation of the talus posteriorly relative to the distal tibia. Diffuse soft tissue swelling. IMPRESSION:  Displaced bimalleolar fracture with subluxation at the tibiotalar joint and disruption of the ankle mortise. Electronically Signed   By: Rolm Baptise M.D.   On: 01/27/2016 09:25    2D Echo: None  Cardiac Cath: None  Admission HPI:  Jessica Glenn is a very pleasant 82 year old woman with basal cell skin carcinoma s/p Moh's and past tobacco use who presents with fall and right ankle fracture.   She reports feeling in her normal state of health when she was going down the steps to her garage where it was dark and missed the last step and fell. She denies feeling lightheaded or dizzy and has no problems with balance at baseline.  She denies prior history of fall, fracture, or osteoporosis. She takes a mulivitamin and other dietary supplements which she is unclear of. She does not have a primary care doctor and denies chronic medical problems. She is very functional at baseline and able to perform ADL's on her own. She can walk up a flight of stairs with no difficultly and does not become short of breath or have chest pain with walking. She denies orthopnea, PND, or lower LE edema. She denies history of cardiac or pulmonary disease. She has never had echo. Her 12-lead EKG on admission was normal.   On admission to the ED xray of her right ankle revealed displaced bimalleolar fracture with subluxation at the tibiotalar joint and disruption of the ankle mortise. It was reduced by ED physician and splinted with repeat xray with improvement of alignment of the ankle but persistent lateral subluxation of the ankle joint with widening along the medial aspect of the ankle joint. She was given fentanyl 100 mcg. She had brief desaturation to 84% requiring supplemental oxygen. She is to be seen by orthopedic surgery for possible surgery.   Hospital Course by problem list: Principal Problem:   Ankle fracture, right Active Problems:   Hyperglycemia   Fall (on) (from) other stairs and steps, initial encounter   History of basal cell carcinoma of skin   Vitamin D insufficiency   1. Right ankle fracture - The patient suffered a transverse fracture to the medial malleolus and oblique fracture of the distal fibula. She was evaluated by orthopedic surgery, who recommended non-weight bearing and delayed ORIF until the right leg decreased in swelling. She was given IV morphine for analgesia and enoxaparin for DVT prophylaxis. Swelling was compressed by Ace wrap and elevation. Her pain quickly improved, and she was discharged with a short course of tramadol and rivaroxaban for oral DVT prophylaxis, with plans for the patient to arrange outpatient  surgery with Dr. Percell Miller on Tuesday, April 4th. She was provided with 3 in 1 beside commode, wheelchair with cushion, and rolling walker all recommended by PT and OT, with appropriate instruction for use with both the patient and her husband.   Discharge Vitals:   BP 124/68 mmHg  Pulse 68  Temp(Src) 98.9 F (37.2 C) (Oral)  Resp 16  Wt 164 lb 0.4 oz (74.4 kg)  SpO2 94%  Discharge Labs:  No results found for this or any previous visit (from the past 24 hour(s)).  Signed: Norval Gable, MD 01/29/2016, 12:21 PM    Services Ordered on Discharge: Home health services Equipment Ordered on Discharge: 3 in 1 beside commode, wheelchair with cushion, rolling walker

## 2016-01-29 NOTE — Progress Notes (Signed)
Discharge instructions and RX's explained/provided to patient verbalized understanding. Patient left floor via wheelchair accompanied by staff DME sent home with patient.  [name], Tivis Ringer, RN

## 2016-01-29 NOTE — Discharge Instructions (Addendum)
Mrs. Protzman,  Please go straight to [facility], directly across [address] from our hospital. Immediately after we discharge you today. Their address is [place]. Their phone number is [phone]. Ask for [facility] when you get there.  I have prescribed a short course of tramadol, a pain medication, only to use when you need it.  Also, take rivaroxaban (blood thinner) once daily until the day before scheduled surgery. Discuss with your surgeon if, when, and how long to take rivaroxaban after your surgery.  Best regards, Blane Ohara

## 2016-01-29 NOTE — Progress Notes (Signed)
Occupational Therapy Treatment Patient Details Name: Jessica Glenn MRN: [id number] DOB: 17-Sep-1929 Today's Date: 01/29/2016    History of present illness Pt admitted after falling with R distal tibia and fibula fractures. Tentative plan for surgery 02/02/16 after swelling subsides.   OT comments  Educated pt and husband in ADL adhering to RLE NWB status and in use of DME with pt and husband verbalizing understanding. All questions answered.   Follow Up Recommendations  Home health OT    Equipment Recommendations  3 in 1 bedside comode;Wheelchair (measurements OT);Wheelchair cushion (measurements OT) (elevating leg rests)    Recommendations for Other Services      Precautions / Restrictions Precautions Precautions: Fall Restrictions Weight Bearing Restrictions: Yes RLE Weight Bearing: Non weight bearing       Mobility Bed Mobility Overal bed mobility: Modified Independent       Transfers Overall transfer level: Needs assistance Equipment used: Rolling walker (2 wheeled);None Transfers: Sit to/from Stand Sit to Stand: Supervision Stand pivot transfers: Supervision       General transfer comment: Initial cues for hand placement when coming to stand with walker.     Balance Overall balance assessment: Needs assistance Sitting-balance support: No upper extremity supported;Feet supported Sitting balance-Leahy Scale: Normal     Standing balance support: Single extremity supported Standing balance-Leahy Scale: Poor Standing balance comment: needs at least one hand on walker in standing                   ADL Overall ADL's : Needs assistance/impaired     Grooming: Supervision/safety;Standing       Lower Body Bathing: Minimal assistance;Sitting/lateral leans;Sit to/from stand Lower Body Bathing Details (indicate cue type and reason): instructed in both sit to stand and leaning side to side technique     Lower Body Dressing: Minimal assistance;Sitting/lateral  leans;Sit to/from stand Lower Body Dressing Details (indicate cue type and reason): instructed in both sit to stand and leaning side to side techniques and to dress R LE first and undress it last Toilet Transfer: Set up;RW;Ambulation;BSC Toilet Transfer Details (indicate cue type and reason): educated in multiple uses of 3 in 1. Toileting- Clothing Manipulation and Hygiene: Minimal assistance;Sit to/from stand Toileting - Clothing Manipulation Details (indicate cue type and reason): instructed in leaning side to side and use of tongs for pericare and wet wipes as needed   Tub/Shower Transfer Details (indicate cue type and reason): educated in A-P technique, but did not practice Functional mobility during ADLs: Supervision/safety;Rolling walker General ADL Comments: Instructed in technique for washing hair in sink as pt does not feel comfortable with A-P transfer to 3 in 1 for showering.      Vision                     Perception     Praxis      Cognition   Behavior During Therapy: WFL for tasks assessed/performed Overall Cognitive Status: Within Functional Limits for tasks assessed                       Extremity/Trunk Assessment               Exercises     Shoulder Instructions       General Comments      Pertinent Vitals/ Pain       Pain Assessment: No/denies pain Pain Intervention(s): Monitored during session  Home Living Family/patient expects to be discharged to:: Private residence Living Arrangements:  Spouse/significant other Available Help at Discharge: Family;Available 24 hours/day (husband has lifting restriction ) Type of Home: House Home Access: Stairs to enter (family is building a ramp)     Home Layout: One level     Bathroom Shower/Tub: Occupational psychologist: Standard     Home Equipment: None          Prior Functioning/Environment Level of Independence: Independent            Frequency Min 2X/week      Progress Toward Goals  OT Goals(current goals can now be found in the care plan section)  Progress towards OT goals: Progressing toward goals  Acute Rehab OT Goals Patient Stated Goal: get through her ankle surgery Time For Goal Achievement: 02/04/16 Potential to Achieve Goals: Good  Plan Discharge plan remains appropriate    Co-evaluation                 End of Session Equipment Utilized During Treatment: Gait belt;Rolling walker   Activity Tolerance Patient tolerated treatment well   Patient Left in bed;with call bell/phone within reach;with bed alarm set;with family/visitor present   Nurse Communication          Time: 1201-1232 OT Time Calculation (min): 31 min  Charges: OT General Charges $OT Visit: 1 Procedure OT Treatments $Self Care/Home Management : 23-37 mins  [name] 01/29/2016, 1:00 PM  [phone]

## 2016-02-02 ENCOUNTER — Ambulatory Visit (HOSPITAL_BASED_OUTPATIENT_CLINIC_OR_DEPARTMENT_OTHER)
Admission: RE | Admit: 2016-02-02 | Discharge: 2016-02-02 | Disposition: A | Discharge status: Home / Self Care | Payer: Medicare Other | Source: Ambulatory Visit | Attending: Orthopedic Surgery | Admitting: Orthopedic Surgery

## 2016-02-02 ENCOUNTER — Encounter (HOSPITAL_BASED_OUTPATIENT_CLINIC_OR_DEPARTMENT_OTHER)
Admission: RE | Disposition: A | Discharge status: Home / Self Care | Payer: Self-pay | Source: Ambulatory Visit | Attending: Orthopedic Surgery

## 2016-02-02 ENCOUNTER — Encounter (HOSPITAL_BASED_OUTPATIENT_CLINIC_OR_DEPARTMENT_OTHER): Payer: Self-pay | Admitting: Anesthesiology

## 2016-02-02 ENCOUNTER — Ambulatory Visit (HOSPITAL_BASED_OUTPATIENT_CLINIC_OR_DEPARTMENT_OTHER): Payer: Medicare Other | Admitting: Anesthesiology

## 2016-02-02 DIAGNOSIS — S82841A Displaced bimalleolar fracture of right lower leg, initial encounter for closed fracture: Secondary | ICD-10-CM | POA: Diagnosis present

## 2016-02-02 DIAGNOSIS — W19XXXA Unspecified fall, initial encounter: Secondary | ICD-10-CM | POA: Diagnosis not present

## 2016-02-02 DIAGNOSIS — Z87891 Personal history of nicotine dependence: Secondary | ICD-10-CM | POA: Diagnosis not present

## 2016-02-02 DIAGNOSIS — Z85828 Personal history of other malignant neoplasm of skin: Secondary | ICD-10-CM | POA: Insufficient documentation

## 2016-02-02 HISTORY — PX: ORIF ANKLE FRACTURE: SHX5408

## 2016-02-02 SURGERY — OPEN REDUCTION INTERNAL FIXATION (ORIF) ANKLE FRACTURE
Anesthesia: General | Site: Ankle | Laterality: Right

## 2016-02-02 MED ORDER — DEXAMETHASONE SODIUM PHOSPHATE 10 MG/ML IJ SOLN
INTRAMUSCULAR | Status: AC
  Filled 2016-02-02: qty 1

## 2016-02-02 MED ORDER — FENTANYL CITRATE (PF) 100 MCG/2ML IJ SOLN
50.0000 ug | INTRAMUSCULAR | Status: DC | PRN
  Administered 2016-02-02: 100 ug via INTRAVENOUS

## 2016-02-02 MED ORDER — SCOPOLAMINE 1 MG/3DAYS TD PT72: 1.0000 | MEDICATED_PATCH | Freq: Once | TRANSDERMAL | Status: DC | PRN

## 2016-02-02 MED ORDER — MIDAZOLAM HCL 2 MG/2ML IJ SOLN
INTRAMUSCULAR | Status: AC
  Filled 2016-02-02: qty 2

## 2016-02-02 MED ORDER — ACETAMINOPHEN 500 MG PO TABS
ORAL_TABLET | ORAL | Status: AC
  Filled 2016-02-02: qty 2

## 2016-02-02 MED ORDER — HYDROCODONE-ACETAMINOPHEN 5-325 MG PO TABS: 1.0000 | ORAL_TABLET | ORAL | Status: DC | PRN

## 2016-02-02 MED ORDER — EPHEDRINE SULFATE 50 MG/ML IJ SOLN
INTRAMUSCULAR | Status: DC | PRN
  Administered 2016-02-02: 10 mg via INTRAVENOUS

## 2016-02-02 MED ORDER — PHENYLEPHRINE HCL 10 MG/ML IJ SOLN: 10.0000 mg | INTRAVENOUS | Status: DC | PRN

## 2016-02-02 MED ORDER — DEXAMETHASONE SODIUM PHOSPHATE 10 MG/ML IJ SOLN
INTRAMUSCULAR | Status: DC | PRN
  Administered 2016-02-02: 8 mg via INTRAVENOUS

## 2016-02-02 MED ORDER — DOCUSATE SODIUM 100 MG PO CAPS: 100.0000 mg | ORAL_CAPSULE | Freq: Two times a day (BID) | ORAL | Status: DC

## 2016-02-02 MED ORDER — SUCCINYLCHOLINE CHLORIDE 20 MG/ML IJ SOLN
INTRAMUSCULAR | Status: AC
  Filled 2016-02-02: qty 1

## 2016-02-02 MED ORDER — HYDROMORPHONE HCL 1 MG/ML IJ SOLN
0.2500 mg | INTRAMUSCULAR | Status: DC | PRN
  Administered 2016-02-02 (×3): 0.5 mg via INTRAVENOUS

## 2016-02-02 MED ORDER — CEFAZOLIN SODIUM-DEXTROSE 2-4 GM/100ML-% IV SOLN
INTRAVENOUS | Status: AC
  Filled 2016-02-02: qty 100

## 2016-02-02 MED ORDER — PHENYLEPHRINE 40 MCG/ML (10ML) SYRINGE FOR IV PUSH (FOR BLOOD PRESSURE SUPPORT)
PREFILLED_SYRINGE | INTRAVENOUS | Status: AC
  Filled 2016-02-02: qty 10

## 2016-02-02 MED ORDER — BUPIVACAINE-EPINEPHRINE (PF) 0.5% -1:200000 IJ SOLN
INTRAMUSCULAR | Status: DC | PRN
  Administered 2016-02-02 (×2): 10 mL via PERINEURAL

## 2016-02-02 MED ORDER — ONDANSETRON HCL 4 MG/2ML IJ SOLN
INTRAMUSCULAR | Status: AC
  Filled 2016-02-02: qty 2

## 2016-02-02 MED ORDER — HYDROMORPHONE HCL 1 MG/ML IJ SOLN
INTRAMUSCULAR | Status: AC
  Filled 2016-02-02: qty 1

## 2016-02-02 MED ORDER — ATROPINE SULFATE 0.4 MG/ML IJ SOLN
INTRAMUSCULAR | Status: AC
  Filled 2016-02-02: qty 1

## 2016-02-02 MED ORDER — GLYCOPYRROLATE 0.2 MG/ML IJ SOLN: 0.2000 mg | Freq: Once | INTRAMUSCULAR | Status: DC | PRN

## 2016-02-02 MED ORDER — CHLORHEXIDINE GLUCONATE 4 % EX LIQD: 60.0000 mL | Freq: Once | CUTANEOUS | Status: DC

## 2016-02-02 MED ORDER — PROPOFOL 10 MG/ML IV BOLUS
INTRAVENOUS | Status: DC | PRN
  Administered 2016-02-02: 100 mg via INTRAVENOUS

## 2016-02-02 MED ORDER — MIDAZOLAM HCL 2 MG/2ML IJ SOLN
1.0000 mg | INTRAMUSCULAR | Status: DC | PRN
  Administered 2016-02-02: 1 mg via INTRAVENOUS

## 2016-02-02 MED ORDER — ASPIRIN EC 325 MG PO TBEC: 325.0000 mg | DELAYED_RELEASE_TABLET | Freq: Every day | ORAL | Status: DC

## 2016-02-02 MED ORDER — POVIDONE-IODINE 10 % EX SWAB
2.0000 | Freq: Once | CUTANEOUS | Status: DC
Start: 2016-02-02 — End: 2016-02-02

## 2016-02-02 MED ORDER — EPHEDRINE SULFATE 50 MG/ML IJ SOLN
INTRAMUSCULAR | Status: AC
  Filled 2016-02-02: qty 1

## 2016-02-02 MED ORDER — ROPIVACAINE HCL 5 MG/ML IJ SOLN
INTRAMUSCULAR | Status: DC | PRN
  Administered 2016-02-02 (×2): 10 mL via PERINEURAL

## 2016-02-02 MED ORDER — OXYCODONE HCL 5 MG PO TABS: 5.0000 mg | ORAL_TABLET | Freq: Once | ORAL | Status: DC | PRN

## 2016-02-02 MED ORDER — ONDANSETRON HCL 4 MG PO TABS: 4.0000 mg | ORAL_TABLET | Freq: Three times a day (TID) | ORAL | Status: DC | PRN

## 2016-02-02 MED ORDER — MEPERIDINE HCL 25 MG/ML IJ SOLN: 6.2500 mg | INTRAMUSCULAR | Status: DC | PRN

## 2016-02-02 MED ORDER — LIDOCAINE HCL (CARDIAC) 20 MG/ML IV SOLN
INTRAVENOUS | Status: AC
  Filled 2016-02-02: qty 5

## 2016-02-02 MED ORDER — ACETAMINOPHEN 500 MG PO TABS
1000.0000 mg | ORAL_TABLET | Freq: Once | ORAL | Status: AC
  Administered 2016-02-02: 1000 mg via ORAL

## 2016-02-02 MED ORDER — LIDOCAINE HCL (CARDIAC) 20 MG/ML IV SOLN
INTRAVENOUS | Status: DC | PRN
  Administered 2016-02-02: 75 mg via INTRAVENOUS

## 2016-02-02 MED ORDER — CEFAZOLIN SODIUM-DEXTROSE 2-4 GM/100ML-% IV SOLN
2.0000 g | INTRAVENOUS | Status: AC
  Administered 2016-02-02: 2 g via INTRAVENOUS

## 2016-02-02 MED ORDER — FENTANYL CITRATE (PF) 100 MCG/2ML IJ SOLN
INTRAMUSCULAR | Status: AC
  Filled 2016-02-02: qty 2

## 2016-02-02 MED ORDER — OXYCODONE HCL 5 MG/5ML PO SOLN: 5.0000 mg | Freq: Once | ORAL | Status: DC | PRN

## 2016-02-02 MED ORDER — LACTATED RINGERS IV SOLN
INTRAVENOUS | Status: DC
  Administered 2016-02-02 (×3): via INTRAVENOUS

## 2016-02-02 MED ORDER — ONDANSETRON HCL 4 MG/2ML IJ SOLN
INTRAMUSCULAR | Status: DC | PRN
  Administered 2016-02-02: 4 mg via INTRAVENOUS

## 2016-02-02 MED ORDER — PROPOFOL 500 MG/50ML IV EMUL
INTRAVENOUS | Status: AC
  Filled 2016-02-02: qty 50

## 2016-02-02 MED ORDER — DEXTROSE-NACL 5-0.45 % IV SOLN: 100.0000 mL/h | INTRAVENOUS | Status: DC

## 2016-02-02 SURGICAL SUPPLY — 80 items
BANDAGE ACE 4X5 VEL STRL LF (GAUZE/BANDAGES/DRESSINGS) ×3 IMPLANT
BANDAGE ACE 6X5 VEL STRL LF (GAUZE/BANDAGES/DRESSINGS) ×3 IMPLANT
BANDAGE ESMARK 6X9 LF (GAUZE/BANDAGES/DRESSINGS) ×1 IMPLANT
BIT DRILL 2.5X125 (BIT) ×3 IMPLANT
BIT DRILL 3.5X125 (BIT) ×1 IMPLANT
BIT DRILL CANN 2.7 (BIT) ×1
BIT DRILL CANN 2.7MM (BIT) ×1
BIT DRILL SRG 2.7XCANN AO CPLG (BIT) ×1 IMPLANT
BIT DRL SRG 2.7XCANN AO CPLNG (BIT) ×1
BLADE SURG 15 STRL LF DISP TIS (BLADE) ×2 IMPLANT
BLADE SURG 15 STRL SS (BLADE) ×4
BNDG COHESIVE 4X5 TAN STRL (GAUZE/BANDAGES/DRESSINGS) ×3 IMPLANT
BNDG ESMARK 6X9 LF (GAUZE/BANDAGES/DRESSINGS) ×3
CHLORAPREP W/TINT 26ML (MISCELLANEOUS) ×3 IMPLANT
CLOSURE STERI-STRIP 1/2X4 (GAUZE/BANDAGES/DRESSINGS) ×1
CLSR STERI-STRIP ANTIMIC 1/2X4 (GAUZE/BANDAGES/DRESSINGS) ×2 IMPLANT
COVER BACK TABLE 60X90IN (DRAPES) ×3 IMPLANT
CUFF TOURNIQUET SINGLE 24IN (TOURNIQUET CUFF) IMPLANT
CUFF TOURNIQUET SINGLE 34IN LL (TOURNIQUET CUFF) ×3 IMPLANT
DECANTER SPIKE VIAL GLASS SM (MISCELLANEOUS) IMPLANT
DRAPE EXTREMITY T 121X128X90 (DRAPE) ×3 IMPLANT
DRAPE IMP U-DRAPE 54X76 (DRAPES) ×3 IMPLANT
DRAPE OEC MINIVIEW 54X84 (DRAPES) ×3 IMPLANT
DRAPE U-SHAPE 47X51 STRL (DRAPES) ×3 IMPLANT
DRILL BIT 3.5X125 (BIT) ×2
DRSG EMULSION OIL 3X3 NADH (GAUZE/BANDAGES/DRESSINGS) ×3 IMPLANT
DRSG PAD ABDOMINAL 8X10 ST (GAUZE/BANDAGES/DRESSINGS) ×3 IMPLANT
ELECT REM PT RETURN 9FT ADLT (ELECTROSURGICAL) ×3
ELECTRODE REM PT RTRN 9FT ADLT (ELECTROSURGICAL) ×1 IMPLANT
GAUZE SPONGE 4X4 12PLY STRL (GAUZE/BANDAGES/DRESSINGS) ×3 IMPLANT
GLOVE BIO SURGEON STRL SZ7.5 (GLOVE) ×3 IMPLANT
GLOVE BIO SURGEON STRL SZ8 (GLOVE) IMPLANT
GLOVE BIOGEL PI IND STRL 7.0 (GLOVE) ×1 IMPLANT
GLOVE BIOGEL PI IND STRL 8 (GLOVE) ×1 IMPLANT
GLOVE BIOGEL PI INDICATOR 7.0 (GLOVE) ×2
GLOVE BIOGEL PI INDICATOR 8 (GLOVE) ×2
GLOVE ECLIPSE 6.5 STRL STRAW (GLOVE) ×3 IMPLANT
GOWN STRL REUS W/ TWL LRG LVL3 (GOWN DISPOSABLE) ×1 IMPLANT
GOWN STRL REUS W/ TWL XL LVL3 (GOWN DISPOSABLE) ×1 IMPLANT
GOWN STRL REUS W/TWL LRG LVL3 (GOWN DISPOSABLE) ×2
GOWN STRL REUS W/TWL XL LVL3 (GOWN DISPOSABLE) ×2
K-WIRE ORTHOPEDIC 1.4X150L (WIRE) ×6
KWIRE ORTHOPEDIC 1.4X150L (WIRE) ×2 IMPLANT
NEEDLE HYPO 22GX1.5 SAFETY (NEEDLE) IMPLANT
NS IRRIG 1000ML POUR BTL (IV SOLUTION) ×3 IMPLANT
PACK BASIN DAY SURGERY FS (CUSTOM PROCEDURE TRAY) ×3 IMPLANT
PAD CAST 4YDX4 CTTN HI CHSV (CAST SUPPLIES) ×1 IMPLANT
PADDING CAST ABS 4INX4YD NS (CAST SUPPLIES) ×6
PADDING CAST ABS COTTON 4X4 ST (CAST SUPPLIES) ×3 IMPLANT
PADDING CAST COTTON 4X4 STRL (CAST SUPPLIES) ×2
PADDING CAST COTTON 6X4 STRL (CAST SUPPLIES) IMPLANT
PENCIL BUTTON HOLSTER BLD 10FT (ELECTRODE) ×3 IMPLANT
PLATE TUBUAL 1/3 6H (Plate) ×3 IMPLANT
SCREW ASNIS COUNTERSINK (BIT) ×3 IMPLANT
SCREW CANC FT 16X4X2.5XHEX (Screw) ×1 IMPLANT
SCREW CANC FT 20X4X2.5XHEX (Screw) ×1 IMPLANT
SCREW CANCELLOUS 4.0X16MM (Screw) ×2 IMPLANT
SCREW CANCELLOUS 4.0X20 (Screw) ×2 IMPLANT
SCREW CANN ASNIS III 4.0X40MM (Screw) ×6 IMPLANT
SCREW CORTEX ST MATTA 3.5X14 (Screw) ×9 IMPLANT
SCREW CORTEX ST MATTA 3.5X20 (Screw) ×3 IMPLANT
SLEEVE SCD COMPRESS KNEE MED (MISCELLANEOUS) IMPLANT
SPLINT FAST PLASTER 5X30 (CAST SUPPLIES) ×40
SPLINT PLASTER CAST FAST 5X30 (CAST SUPPLIES) ×20 IMPLANT
SPONGE LAP 4X18 X RAY DECT (DISPOSABLE) ×3 IMPLANT
SUCTION FRAZIER HANDLE 10FR (MISCELLANEOUS) ×2
SUCTION TUBE FRAZIER 10FR DISP (MISCELLANEOUS) ×1 IMPLANT
SUT ETHILON 3 0 PS 1 (SUTURE) ×6 IMPLANT
SUT MNCRL AB 4-0 PS2 18 (SUTURE) IMPLANT
SUT MON AB 2-0 CT1 36 (SUTURE) IMPLANT
SUT VIC AB 0 SH 27 (SUTURE) ×3 IMPLANT
SUT VIC AB 2-0 SH 27 (SUTURE)
SUT VIC AB 2-0 SH 27XBRD (SUTURE) IMPLANT
SYR BULB 3OZ (MISCELLANEOUS) ×3 IMPLANT
SYR CONTROL 10ML LL (SYRINGE) IMPLANT
TOWEL OR 17X24 6PK STRL BLUE (TOWEL DISPOSABLE) ×6 IMPLANT
TOWEL OR NON WOVEN STRL DISP B (DISPOSABLE) ×3 IMPLANT
TUBE CONNECTING 20'X1/4 (TUBING) ×1
TUBE CONNECTING 20X1/4 (TUBING) ×2 IMPLANT
UNDERPAD 30X30 (UNDERPADS AND DIAPERS) ×3 IMPLANT

## 2016-02-02 NOTE — Op Note (Signed)
02/02/2016  11:45 AM  PATIENT:  Jessica Glenn    PRE-OPERATIVE DIAGNOSIS:  right ankle fracture  POST-OPERATIVE DIAGNOSIS:  Same  PROCEDURE:  OPEN REDUCTION INTERNAL FIXATION (ORIF) ANKLE FRACTURE  SURGEON:  Dymir Neeson D, MD  ASSISTANT: none  ANESTHESIA:   gen  PREOPERATIVE INDICATIONS:  REMILYNN RICKELS is a  82 y.o. female with a diagnosis of right ankle fracture who failed conservative measures and elected for surgical management.    The risks benefits and alternatives were discussed with the patient preoperatively including but not limited to the risks of infection, bleeding, nerve injury, cardiopulmonary complications, the need for revision surgery, among others, and the patient was willing to proceed.  OPERATIVE IMPLANTS: 1/3 tubular plate and canulated screws stryker  OPERATIVE FINDINGS: Unstable ankle fracture. Stable syndesmosis post op  BLOOD LOSS: min  COMPLICATIONS: none  TOURNIQUET TIME: 92min  OPERATIVE PROCEDURE:  Patient was identified in the preoperative holding area and site was marked by me He was transported to the operating theater and placed on the table in supine position taking care to pad all bony prominences. After a preincinduction time out anesthesia was induced. The right lower lower extremity was prepped and draped in normal sterile fashion and a pre-incision timeout was performed. Jessica Glenn received ancef for preoperative antibiotics.   I made a lateral incision of roughly 7 cm dissection was carried down sharply to the distal fibula and then spreading dissection was used proximally to protect the superficial peroneal nerve. I sharply incised the periosteum and took care to protect the peroneal tendons. I then debrided the fracture site and performed a reduction maneuver which was held in place with a clamp.   I placed a lag screw across the fracture  I then selected a 6-hole one third tubular plate and placed in a neutralization fashion care was  taken distally so as not to penetrate the joint with the cancellus screws.  I then turned my attention medially where I created a 4 cm incision and dissected sharply down to the medial Mal fracture taking care to protect the saphenous vein. I debrided the fracture and reduced and held in place with a tenaculum. I then drilled and placed 2 partially threaded 45 mm cannulated screws one anterior and one posterior across the fracture.  I then stressed the syndesmosis and it was stable  The wound was then thoroughly irrigated and closed using a 0 Vicryl and absorbable Monocryl sutures. He was placed in a short leg splint.   POST OPERATIVE PLAN: Non-weightbearing. DVT prophylaxis will consist of ASA 325  This note was generated using a template and dragon dictation system. In light of that, I have reviewed the note and all aspects of it are applicable to this case. Any dictation errors are due to the computerized dictation system.

## 2016-02-02 NOTE — Anesthesia Postprocedure Evaluation (Signed)
Anesthesia Post Note  Patient: Jessica Glenn  Procedure(s) Performed: Procedure(s) (LRB): OPEN REDUCTION INTERNAL FIXATION (ORIF) ANKLE FRACTURE (Right)  Patient location during evaluation: PACU Anesthesia Type: General and Regional Level of consciousness: awake and alert Pain management: pain level controlled Vital Signs Assessment: post-procedure vital signs reviewed and stable Respiratory status: spontaneous breathing, nonlabored ventilation, respiratory function stable and patient connected to nasal cannula oxygen Cardiovascular status: blood pressure returned to baseline and stable Postop Assessment: no signs of nausea or vomiting Anesthetic complications: no    Last Vitals:  Filed Vitals:   02/02/16 1245 02/02/16 1329  BP: 137/75 153/66  Pulse: 71 76  Temp:  36.9 C  Resp: 13 18    Last Pain:  Filed Vitals:   02/02/16 1330  PainSc: 3                  Montez Hageman

## 2016-02-02 NOTE — Discharge Instructions (Signed)
No weight on right leg  Keep splint clean, dry and intact  Elevate as much as possible.   Discharge Instructions After Orthopedic Procedures:  *You may feel tired and weak following your procedure. It is recommended that you limit physical activity for the next 24 hours and rest at home for the remainder of today and tomorrow. *No strenuous activity should be started without your doctor's permission.  Elevate the extremity that you had surgery on to a level above your heart. This should continue for 48 hours or as instructed by your doctor.  If you had hand, arm or shoulder surgery you should move your fingers frequently unless otherwise instructed by your doctor.  If you had foot, knee or leg surgery you should wiggle your toes frequently unless otherwise instructed by your doctor.  Follow your doctor's exact instructions for activity at home. Use your home equipment as instructed. (Crutches, hard shoes, slings etc.)  Limit your activity as instructed by your doctor.  Report to your doctor should any of the following occur: 1. Extreme swelling of your fingers or toes. 2. Inability to wiggle your fingers or toes. 3. Coldness, pale or bluish color in your fingers or toes. 4. Loss of sensation, numbness or tingling of your fingers or toes. 5. Unusual smell or odor from under your dressing or cast. 6. Excessive bleeding or drainage from the surgical site. 7. Pain not relieved by medication your doctor has prescribed for you. 8. Cast or dressing too tight (do not get your dressing or cast wet or put anything under          your dressing or cast.)  *Do not change your dressing unless instructed by your doctor or discharge nurse. Then follow exact instructions.  *Follow labeled instructions for any medications that your doctor may have prescribed for you. *Should any questions or complications develop following your procedure, PLEASE CONTACT YOUR DOCTOR.  Regional Anesthesia  Blocks  1. Numbness or the inability to move the "blocked" extremity may last from 3-48 hours after placement. The length of time depends on the medication injected and your individual response to the medication. If the numbness is not going away after 48 hours, call your surgeon.  2. The extremity that is blocked will need to be protected until the numbness is gone and the  Strength has returned. Because you cannot feel it, you will need to take extra care to avoid injury. Because it may be weak, you may have difficulty moving it or using it. You may not know what position it is in without looking at it while the block is in effect.  3. For blocks in the legs and feet, returning to weight bearing and walking needs to be done carefully. You will need to wait until the numbness is entirely gone and the strength has returned. You should be able to move your leg and foot normally before you try and bear weight or walk. You will need someone to be with you when you first try to ensure you do not fall and possibly risk injury.  4. Bruising and tenderness at the needle site are common side effects and will resolve in a few days.  5. Persistent numbness or new problems with movement should be communicated to the surgeon or the [place] [phone] [facility] [phone]).  Post Anesthesia Home Care Instructions  Activity: Get plenty of rest for the remainder of the day. A responsible adult should stay with you for  24 hours following the procedure.  For the next 24 hours, DO NOT: -Drive a car -Paediatric nurse -Drink alcoholic beverages -Take any medication unless instructed by your physician -Make any legal decisions or sign important papers.  Meals: Start with liquid foods such as gelatin or soup. Progress to regular foods as tolerated. Avoid greasy, spicy, heavy foods. If nausea and/or vomiting occur, drink only clear liquids until the nausea and/or vomiting  subsides. Call your physician if vomiting continues.  Special Instructions/Symptoms: Your throat may feel dry or sore from the anesthesia or the breathing tube placed in your throat during surgery. If this causes discomfort, gargle with warm salt water. The discomfort should disappear within 24 hours.  If you had a scopolamine patch placed behind your ear for the management of post- operative nausea and/or vomiting:  1. The medication in the patch is effective for 72 hours, after which it should be removed.  Wrap patch in a tissue and discard in the trash. Wash hands thoroughly with soap and water. 2. You may remove the patch earlier than 72 hours if you experience unpleasant side effects which may include dry mouth, dizziness or visual disturbances. 3. Avoid touching the patch. Wash your hands with soap and water after contact with the patch.

## 2016-02-02 NOTE — Anesthesia Preprocedure Evaluation (Signed)
Anesthesia Evaluation  Patient identified by MRN, date of birth, ID band Patient awake    Reviewed: Allergy & Precautions, NPO status , Patient's Chart, lab work & pertinent test results  Airway Mallampati: I  TM Distance: >3 FB Neck ROM: Full    Dental  (+) Teeth Intact, Dental Advisory Given   Pulmonary former smoker,    breath sounds clear to auscultation       Cardiovascular  Rhythm:Regular Rate:Normal     Neuro/Psych    GI/Hepatic   Endo/Other    Renal/GU      Musculoskeletal   Abdominal   Peds  Hematology   Anesthesia Other Findings   Reproductive/Obstetrics                             Anesthesia Physical Anesthesia Plan  ASA: I  Anesthesia Plan: General   Post-op Pain Management: GA combined w/ Regional for post-op pain   Induction: Intravenous  Airway Management Planned: LMA  Additional Equipment:   Intra-op Plan:   Post-operative Plan: Extubation in OR  Informed Consent: I have reviewed the patients History and Physical, chart, labs and discussed the procedure including the risks, benefits and alternatives for the proposed anesthesia with the patient or authorized representative who has indicated his/her understanding and acceptance.   Dental advisory given  Plan Discussed with: CRNA, Anesthesiologist and Surgeon  Anesthesia Plan Comments:         Anesthesia Quick Evaluation  

## 2016-02-02 NOTE — Progress Notes (Signed)
Assisted Dr. Crews with right, ultrasound guided, popliteal/saphenous block. Side rails up, monitors on throughout procedure. See vital signs in flow sheet. Tolerated Procedure well. 

## 2016-02-02 NOTE — Interval H&P Note (Signed)
History and Physical Interval Note:  02/02/2016 9:07 AM  Jessica Glenn  has presented today for surgery, with the diagnosis of ankle fracture  The various methods of treatment have been discussed with the patient and family. After consideration of risks, benefits and other options for treatment, the patient has consented to  Procedure(s) with comments: OPEN REDUCTION INTERNAL FIXATION (ORIF) ANKLE FRACTURE (Right) - stryker mini c arm reg bed bone foam as a surgical intervention .  The patient's history has been reviewed, patient examined, no change in status, stable for surgery.  I have reviewed the patient's chart and labs.  Questions were answered to the patient's satisfaction.     MURPHY, TIMOTHY D

## 2016-02-02 NOTE — Anesthesia Procedure Notes (Addendum)
Anesthesia Regional Block:  Popliteal block  Pre-Anesthetic Checklist: ,, timeout performed, Correct Patient, Correct Site, Correct Laterality, Correct Procedure, Correct Position, site marked, Risks and benefits discussed,  Surgical consent,  Pre-op evaluation,  At surgeon's request and post-op pain management  Laterality: Right and Lower  Prep: chloraprep       Needles:  Injection technique: Single-shot  Needle Type: Echogenic Needle     Needle Length: 9cm 9 cm Needle Gauge: 21 and 21 G    Additional Needles:  Procedures: ultrasound guided (picture in chart) Popliteal block Narrative:  Start time: 02/02/2016 10:21 AM End time: 02/02/2016 10:25 AM Injection made incrementally with aspirations every 5 mL.  Performed by: Personally  Anesthesiologist: CREWS, DAVID   Anesthesia Regional Block:  Adductor canal block  Pre-Anesthetic Checklist: ,, timeout performed, Correct Patient, Correct Site, Correct Laterality, Correct Procedure, Correct Position, site marked, Risks and benefits discussed,  Surgical consent,  Pre-op evaluation,  At surgeon's request and post-op pain management  Laterality: Right and Lower  Prep: chloraprep       Needles:  Injection technique: Single-shot  Needle Type: Echogenic Needle     Needle Length: 9cm 9 cm Needle Gauge: 21 and 21 G    Additional Needles:  Procedures: ultrasound guided (picture in chart) Adductor canal block Narrative:  Start time: 02/02/2016 10:26 AM End time: 02/02/2016 10:29 AM Injection made incrementally with aspirations every 5 mL.  Performed by: Personally  Anesthesiologist: CREWS, DAVID   Procedure Name: LMA Insertion Date/Time: 02/02/2016 10:39 AM Performed by: Melynda Ripple D Pre-anesthesia Checklist: Patient identified, Emergency Drugs available, Suction available and Patient being monitored Patient Re-evaluated:Patient Re-evaluated prior to inductionOxygen Delivery Method: Circle System Utilized Preoxygenation:  Pre-oxygenation with 100% oxygen Intubation Type: IV induction Ventilation: Mask ventilation without difficulty LMA: LMA inserted LMA Size: 4.0 Number of attempts: 1 Airway Equipment and Method: Bite block Placement Confirmation: positive ETCO2 Tube secured with: Tape Dental Injury: Teeth and Oropharynx as per pre-operative assessment      Right Popliteal block image   Right Saphenous block image

## 2016-02-02 NOTE — Transfer of Care (Signed)
Immediate Anesthesia Transfer of Care Note  Patient: Jessica Glenn  Procedure(s) Performed: Procedure(s) with comments: OPEN REDUCTION INTERNAL FIXATION (ORIF) ANKLE FRACTURE (Right) - stryker mini c arm reg bed bone foam  Patient Location: PACU  Anesthesia Type:GA combined with regional for post-op pain  Level of Consciousness: awake, alert  and oriented  Airway & Oxygen Therapy: Patient Spontanous Breathing and Patient connected to face mask oxygen  Post-op Assessment: Report given to RN and Post -op Vital signs reviewed and stable  Post vital signs: Reviewed and stable  Last Vitals:  Filed Vitals:   02/02/16 1030 02/02/16 1150  BP: 110/57 156/75  Pulse: 71 85  Temp:    Resp: 13 13    Complications: No apparent anesthesia complications

## 2016-02-02 NOTE — H&P (View-Only) (Signed)
ORTHOPAEDIC CONSULTATION  REQUESTING PHYSICIAN: Axel Filler, MD  Chief Complaint: right ankle bimal fracture  HPI: Jessica Glenn is a 82 y.o. female who complains of a mechanical fall yesterday  Past Medical History  Diagnosis Date  . History of tobacco use   . Bilateral cataracts     s/p surgery  . Hx of skin cancer, basal cell     s/p Moh's surgery    Past Surgical History  Procedure Laterality Date  . Cataract extraction      both eyes   Social History   Social History  . Marital Status: Married    Spouse Name: N/A  . Number of Children: N/A  . Years of Education: N/A   Social History Main Topics  . Smoking status: Former Research scientist (life sciences)  . Smokeless tobacco: None  . Alcohol Use: No  . Drug Use: None  . Sexual Activity: Not Asked   Other Topics Concern  . None   Social History Narrative   Family History  Problem Relation Age of Onset  . COPD Mother    No Known Allergies Prior to Admission medications   Not on File   Dg Ankle 2 Views Right  01/27/2016  CLINICAL DATA:  Post reduction of the tibia and fibula fractures. EXAM: RIGHT ANKLE - 2 VIEW COMPARISON:  01/27/2016 FINDINGS: Right ankle is within a splint or cast. Improved alignment of the ankle on the lateral view. There continues to be widening along the medial ankle joint on the AP view suggesting lateral subluxation of the talus. Again noted is a displaced fracture involving the medial malleolus and displaced fracture of the distal fibula. IMPRESSION: Fractures of the distal tibia and fibula. Improved alignment of the ankle but there is persistent lateral subluxation of the ankle joint with widening along the medial aspect of the ankle joint. Electronically Signed   By: Markus Daft M.D.   On: 01/27/2016 11:34   Dg Ankle Complete Right  01/27/2016  CLINICAL DATA:  [place] this morning, swelling, deformity the right ankle EXAM: RIGHT ANKLE - COMPLETE 3+ VIEW COMPARISON:  None. FINDINGS: There is a  transverse fracture through the medial malleolus. Displaced oblique fracture through the distal fibula. Disruption of the ankle mortise with widening medially and anteriorly and subluxation of the talus posteriorly relative to the distal tibia. Diffuse soft tissue swelling. IMPRESSION: Displaced bimalleolar fracture with subluxation at the tibiotalar joint and disruption of the ankle mortise. Electronically Signed   By: Rolm Baptise M.D.   On: 01/27/2016 09:25    Positive ROS: All other systems have been reviewed and were otherwise negative with the exception of those mentioned in the HPI and as above.  Labs cbc  Recent Labs  01/27/16 1000  WBC 7.1  HGB 14.2  HCT 42.3  PLT 140*    Labs inflam No results for input(s): CRP in the last 72 hours.  Invalid input(s): ESR  Labs coag  Recent Labs  01/27/16 1504  INR 1.04     Recent Labs  01/27/16 1000  NA 141  K 4.0  CL 109  CO2 23  GLUCOSE 119*  BUN 18  CREATININE 0.83  CALCIUM 9.2    Physical Exam: Filed Vitals:   01/27/16 1800 01/27/16 1830  BP: 143/74 146/70  Pulse: 69 79  Temp:  98.6 F (37 C)  Resp: 16 19   General: Alert, no acute distress Cardiovascular: No pedal edema Respiratory: No cyanosis, no use of accessory musculature  GI: No organomegaly, abdomen is soft and non-tender Skin: No lesions in the area of chief complaint other than those listed below in MSK exam.  Neurologic: Sensation intact distally save for the below mentioned MSK exam Psychiatric: Patient is competent for consent with normal mood and affect Lymphatic: No axillary or cervical lymphadenopathy  MUSCULOSKELETAL:  RLE: compartments soft, NVI Other extremities are atraumatic with painless ROM and NVI.  Assessment: R bimal fracture  Plan: NWB Elevate OR for ORIF when swelling subsides    [name], MD Cell [phone]   01/27/2016 6:32 PM

## 2016-02-03 ENCOUNTER — Encounter (HOSPITAL_BASED_OUTPATIENT_CLINIC_OR_DEPARTMENT_OTHER): Payer: Self-pay | Admitting: Orthopedic Surgery

## 2017-02-10 ENCOUNTER — Encounter (HOSPITAL_BASED_OUTPATIENT_CLINIC_OR_DEPARTMENT_OTHER): Payer: Self-pay | Admitting: *Deleted

## 2017-02-13 NOTE — H&P (Signed)
[facility] [address], [place] [address] [phone] A Division of [facility]  RE: Jessica Glenn, Jessica Glenn   [id number]      DOB: 11-13-1928 02[birth year]17 Reason for visit: Follow-up referral from the urgent care for her right ankle wound.   HPI:  She is 83 years old. She is a pleasant woman who has a history of open reduction internal fixation of her bimalleolar ankle fracture about a year ago. She noticed some irritation at the incision and had a small stitch abscess in a similar spot she had one immediately after her surgery. She was started on Keflex in the urgent care and was following up with me. No systemic symptoms. Pain is well controlled. No complaints about the ankle.   OBJECTIVE: She is a well appearing female in no apparent distress. The screw heads are very distal to where the actual small wound is. She does have a small pinpoint aspect right on her incision line with a small amount of drainage. There is no palpable fluctuance. Ankle has a painless range of motion. No tenderness at the ankle.   IMAGES: X-rays show no osseous abnormality. Good healing of her fracture.   ASSESSMENT/PLAN:  This is the second time she has developed a little superficial infection at this spot. I think this will likely warrant a superficial wash out and she would like to have the medial hardware removed at the same time. I think that is reasonable. She will continue on antibiotics until next week when we do that. Should she worsen at all she will call or return sooner.     Ernesta Amble.  Percell Miller, M.D. Electronically verified by Ernesta Amble. Percell Miller, M.D. TDM: jgc  D  02-08-17 T   02-10-17

## 2017-02-14 ENCOUNTER — Encounter (HOSPITAL_BASED_OUTPATIENT_CLINIC_OR_DEPARTMENT_OTHER): Payer: Self-pay

## 2017-02-14 ENCOUNTER — Ambulatory Visit (HOSPITAL_BASED_OUTPATIENT_CLINIC_OR_DEPARTMENT_OTHER)
Admission: RE | Admit: 2017-02-14 | Discharge: 2017-02-14 | Disposition: A | Discharge status: Home / Self Care | Payer: Medicare Other | Source: Ambulatory Visit | Attending: Orthopedic Surgery | Admitting: Orthopedic Surgery

## 2017-02-14 ENCOUNTER — Encounter (HOSPITAL_BASED_OUTPATIENT_CLINIC_OR_DEPARTMENT_OTHER)
Admission: RE | Disposition: A | Discharge status: Home / Self Care | Payer: Self-pay | Source: Ambulatory Visit | Attending: Orthopedic Surgery

## 2017-02-14 ENCOUNTER — Ambulatory Visit (HOSPITAL_BASED_OUTPATIENT_CLINIC_OR_DEPARTMENT_OTHER): Payer: Medicare Other | Admitting: Anesthesiology

## 2017-02-14 DIAGNOSIS — Y838 Other surgical procedures as the cause of abnormal reaction of the patient, or of later complication, without mention of misadventure at the time of the procedure: Secondary | ICD-10-CM | POA: Diagnosis not present

## 2017-02-14 DIAGNOSIS — T814XXA Infection following a procedure, initial encounter: Secondary | ICD-10-CM | POA: Diagnosis present

## 2017-02-14 DIAGNOSIS — Z87891 Personal history of nicotine dependence: Secondary | ICD-10-CM | POA: Insufficient documentation

## 2017-02-14 DIAGNOSIS — L089 Local infection of the skin and subcutaneous tissue, unspecified: Secondary | ICD-10-CM

## 2017-02-14 DIAGNOSIS — Z472 Encounter for removal of internal fixation device: Secondary | ICD-10-CM | POA: Diagnosis not present

## 2017-02-14 DIAGNOSIS — S90551A Superficial foreign body, right ankle, initial encounter: Secondary | ICD-10-CM

## 2017-02-14 HISTORY — PX: I&D EXTREMITY: SHX5045

## 2017-02-14 HISTORY — PX: HARDWARE REMOVAL: SHX979

## 2017-02-14 SURGERY — IRRIGATION AND DEBRIDEMENT EXTREMITY
Anesthesia: General | Site: Leg Lower | Laterality: Right

## 2017-02-14 MED ORDER — PROMETHAZINE HCL 25 MG/ML IJ SOLN: 6.2500 mg | INTRAMUSCULAR | Status: DC | PRN

## 2017-02-14 MED ORDER — 0.9 % SODIUM CHLORIDE (POUR BTL) OPTIME
TOPICAL | Status: DC | PRN
  Administered 2017-02-14: 400 mL

## 2017-02-14 MED ORDER — ACETAMINOPHEN 500 MG PO TABS
ORAL_TABLET | ORAL | Status: AC
  Filled 2017-02-14: qty 2

## 2017-02-14 MED ORDER — HYDROCODONE-ACETAMINOPHEN 5-325 MG PO TABS: 1.0000 | ORAL_TABLET | Freq: Four times a day (QID) | ORAL | 0 refills | Status: DC | PRN

## 2017-02-14 MED ORDER — FENTANYL CITRATE (PF) 100 MCG/2ML IJ SOLN
INTRAMUSCULAR | Status: DC | PRN
  Administered 2017-02-14 (×2): 50 ug via INTRAVENOUS

## 2017-02-14 MED ORDER — LIDOCAINE 2% (20 MG/ML) 5 ML SYRINGE
INTRAMUSCULAR | Status: DC | PRN
  Administered 2017-02-14: 60 mg via INTRAVENOUS

## 2017-02-14 MED ORDER — ONDANSETRON HCL 4 MG/2ML IJ SOLN
INTRAMUSCULAR | Status: DC | PRN
  Administered 2017-02-14: 4 mg via INTRAVENOUS

## 2017-02-14 MED ORDER — CEPHALEXIN 500 MG PO CAPS: 500.0000 mg | ORAL_CAPSULE | Freq: Three times a day (TID) | ORAL | 0 refills | Status: AC

## 2017-02-14 MED ORDER — HYDROCODONE-ACETAMINOPHEN 7.5-325 MG PO TABS: 1.0000 | ORAL_TABLET | Freq: Once | ORAL | Status: DC | PRN

## 2017-02-14 MED ORDER — KETOROLAC TROMETHAMINE 30 MG/ML IJ SOLN: 15.0000 mg | Freq: Once | INTRAMUSCULAR | Status: DC | PRN

## 2017-02-14 MED ORDER — ONDANSETRON HCL 4 MG/2ML IJ SOLN
INTRAMUSCULAR | Status: AC
  Filled 2017-02-14: qty 2

## 2017-02-14 MED ORDER — LIDOCAINE 2% (20 MG/ML) 5 ML SYRINGE
INTRAMUSCULAR | Status: AC
  Filled 2017-02-14: qty 5

## 2017-02-14 MED ORDER — FENTANYL CITRATE (PF) 100 MCG/2ML IJ SOLN
25.0000 ug | INTRAMUSCULAR | Status: DC | PRN
  Administered 2017-02-14: 25 ug via INTRAVENOUS

## 2017-02-14 MED ORDER — BUPIVACAINE HCL (PF) 0.5 % IJ SOLN
INTRAMUSCULAR | Status: DC | PRN
  Administered 2017-02-14: 5 mL

## 2017-02-14 MED ORDER — SCOPOLAMINE 1 MG/3DAYS TD PT72: 1.0000 | MEDICATED_PATCH | Freq: Once | TRANSDERMAL | Status: DC | PRN

## 2017-02-14 MED ORDER — EPHEDRINE 5 MG/ML INJ
INTRAVENOUS | Status: AC
  Filled 2017-02-14: qty 10

## 2017-02-14 MED ORDER — ACETAMINOPHEN 500 MG PO TABS
1000.0000 mg | ORAL_TABLET | Freq: Once | ORAL | Status: AC
  Administered 2017-02-14: 1000 mg via ORAL

## 2017-02-14 MED ORDER — PROPOFOL 10 MG/ML IV BOLUS
INTRAVENOUS | Status: AC
  Filled 2017-02-14: qty 20

## 2017-02-14 MED ORDER — POVIDONE-IODINE 7.5 % EX SOLN: Freq: Once | CUTANEOUS | Status: DC

## 2017-02-14 MED ORDER — FENTANYL CITRATE (PF) 100 MCG/2ML IJ SOLN: 50.0000 ug | INTRAMUSCULAR | Status: DC | PRN

## 2017-02-14 MED ORDER — MIDAZOLAM HCL 2 MG/2ML IJ SOLN: 1.0000 mg | INTRAMUSCULAR | Status: DC | PRN

## 2017-02-14 MED ORDER — FENTANYL CITRATE (PF) 100 MCG/2ML IJ SOLN
INTRAMUSCULAR | Status: AC
  Filled 2017-02-14: qty 2

## 2017-02-14 MED ORDER — BUPIVACAINE HCL (PF) 0.5 % IJ SOLN
INTRAMUSCULAR | Status: AC
  Filled 2017-02-14: qty 30

## 2017-02-14 MED ORDER — LACTATED RINGERS IV SOLN: INTRAVENOUS | Status: DC

## 2017-02-14 MED ORDER — MEPERIDINE HCL 25 MG/ML IJ SOLN: 6.2500 mg | INTRAMUSCULAR | Status: DC | PRN

## 2017-02-14 MED ORDER — CEFAZOLIN SODIUM-DEXTROSE 2-4 GM/100ML-% IV SOLN
2.0000 g | INTRAVENOUS | Status: AC
  Administered 2017-02-14: 2 g via INTRAVENOUS

## 2017-02-14 MED ORDER — LACTATED RINGERS IV SOLN
INTRAVENOUS | Status: DC
  Administered 2017-02-14: 08:00:00 via INTRAVENOUS

## 2017-02-14 MED ORDER — PROPOFOL 10 MG/ML IV BOLUS
INTRAVENOUS | Status: DC | PRN
  Administered 2017-02-14: 120 mg via INTRAVENOUS

## 2017-02-14 MED ORDER — EPHEDRINE SULFATE-NACL 50-0.9 MG/10ML-% IV SOSY
PREFILLED_SYRINGE | INTRAVENOUS | Status: DC | PRN
  Administered 2017-02-14 (×3): 10 mg via INTRAVENOUS

## 2017-02-14 MED ORDER — CEFAZOLIN SODIUM-DEXTROSE 2-4 GM/100ML-% IV SOLN
INTRAVENOUS | Status: AC
  Filled 2017-02-14: qty 100

## 2017-02-14 SURGICAL SUPPLY — 78 items
BANDAGE ACE 4X5 VEL STRL LF (GAUZE/BANDAGES/DRESSINGS) ×3 IMPLANT
BANDAGE ACE 6X5 VEL STRL LF (GAUZE/BANDAGES/DRESSINGS) IMPLANT
BLADE SURG 10 STRL SS (BLADE) IMPLANT
BLADE SURG 15 STRL LF DISP TIS (BLADE) ×1 IMPLANT
BLADE SURG 15 STRL SS (BLADE) ×2
BNDG COHESIVE 4X5 TAN STRL (GAUZE/BANDAGES/DRESSINGS) ×3 IMPLANT
BNDG COHESIVE 6X5 TAN STRL LF (GAUZE/BANDAGES/DRESSINGS) IMPLANT
BNDG ESMARK 4X9 LF (GAUZE/BANDAGES/DRESSINGS) ×3 IMPLANT
BNDG GAUZE ELAST 4 BULKY (GAUZE/BANDAGES/DRESSINGS) IMPLANT
CHLORAPREP W/TINT 26ML (MISCELLANEOUS) ×6 IMPLANT
CLOSURE STERI-STRIP 1/2X4 (GAUZE/BANDAGES/DRESSINGS)
CLSR STERI-STRIP ANTIMIC 1/2X4 (GAUZE/BANDAGES/DRESSINGS) IMPLANT
CONT SPEC 4OZ CLIKSEAL STRL BL (MISCELLANEOUS) ×3 IMPLANT
COVER BACK TABLE 60X90IN (DRAPES) ×3 IMPLANT
COVER BACK TABLE 80X110 HD (DRAPES) ×3 IMPLANT
COVER MAYO STAND STRL (DRAPES) IMPLANT
CUFF TOURNIQUET SINGLE 24IN (TOURNIQUET CUFF) ×3 IMPLANT
CUFF TOURNIQUET SINGLE 34IN LL (TOURNIQUET CUFF) IMPLANT
DECANTER SPIKE VIAL GLASS SM (MISCELLANEOUS) IMPLANT
DRAPE EXTREMITY T 121X128X90 (DRAPE) ×3 IMPLANT
DRAPE IMP U-DRAPE 54X76 (DRAPES) ×3 IMPLANT
DRAPE OEC MINIVIEW 54X84 (DRAPES) ×3 IMPLANT
DRAPE SURG 17X23 STRL (DRAPES) IMPLANT
DRAPE U-SHAPE 47X51 STRL (DRAPES) ×3 IMPLANT
DRSG EMULSION OIL 3X3 NADH (GAUZE/BANDAGES/DRESSINGS) ×3 IMPLANT
DRSG PAD ABDOMINAL 8X10 ST (GAUZE/BANDAGES/DRESSINGS) IMPLANT
ELECT REM PT RETURN 9FT ADLT (ELECTROSURGICAL) ×3
ELECTRODE REM PT RTRN 9FT ADLT (ELECTROSURGICAL) ×1 IMPLANT
GAUZE SPONGE 4X4 12PLY STRL (GAUZE/BANDAGES/DRESSINGS) ×3 IMPLANT
GAUZE SPONGE 4X4 12PLY STRL LF (GAUZE/BANDAGES/DRESSINGS) ×3 IMPLANT
GAUZE XEROFORM 1X8 LF (GAUZE/BANDAGES/DRESSINGS) IMPLANT
GLOVE BIO SURGEON STRL SZ7.5 (GLOVE) ×6 IMPLANT
GLOVE BIOGEL PI IND STRL 7.0 (GLOVE) ×3 IMPLANT
GLOVE BIOGEL PI IND STRL 8 (GLOVE) ×2 IMPLANT
GLOVE BIOGEL PI INDICATOR 7.0 (GLOVE) ×6
GLOVE BIOGEL PI INDICATOR 8 (GLOVE) ×4
GLOVE ECLIPSE 6.5 STRL STRAW (GLOVE) ×3 IMPLANT
GLOVE SURG SS PI 6.5 STRL IVOR (GLOVE) ×3 IMPLANT
GOWN STRL REUS W/ TWL LRG LVL3 (GOWN DISPOSABLE) ×4 IMPLANT
GOWN STRL REUS W/ TWL XL LVL3 (GOWN DISPOSABLE) ×1 IMPLANT
GOWN STRL REUS W/TWL LRG LVL3 (GOWN DISPOSABLE) ×8
GOWN STRL REUS W/TWL XL LVL3 (GOWN DISPOSABLE) ×2
NEEDLE HYPO 25X1 1.5 SAFETY (NEEDLE) ×3 IMPLANT
NS IRRIG 1000ML POUR BTL (IV SOLUTION) ×3 IMPLANT
PACK BASIN DAY SURGERY FS (CUSTOM PROCEDURE TRAY) ×3 IMPLANT
PAD ARMBOARD 7.5X6 YLW CONV (MISCELLANEOUS) IMPLANT
PAD CAST 3X4 CTTN HI CHSV (CAST SUPPLIES) IMPLANT
PAD CAST 4YDX4 CTTN HI CHSV (CAST SUPPLIES) IMPLANT
PADDING CAST ABS 4INX4YD NS (CAST SUPPLIES)
PADDING CAST ABS COTTON 4X4 ST (CAST SUPPLIES) IMPLANT
PADDING CAST COTTON 3X4 STRL (CAST SUPPLIES)
PADDING CAST COTTON 4X4 STRL (CAST SUPPLIES)
PENCIL BUTTON HOLSTER BLD 10FT (ELECTRODE) ×3 IMPLANT
SET IRRIG Y TYPE TUR BLADDER L (SET/KITS/TRAYS/PACK) ×3 IMPLANT
SHEET MEDIUM DRAPE 40X70 STRL (DRAPES) IMPLANT
SLEEVE SCD COMPRESS KNEE MED (MISCELLANEOUS) ×3 IMPLANT
SPONGE LAP 18X18 X RAY DECT (DISPOSABLE) ×3 IMPLANT
SPONGE LAP 4X18 X RAY DECT (DISPOSABLE) IMPLANT
STOCKINETTE 4X48 STRL (DRAPES) IMPLANT
STOCKINETTE 6  STRL (DRAPES) ×2
STOCKINETTE 6 STRL (DRAPES) ×1 IMPLANT
SUCTION FRAZIER HANDLE 10FR (MISCELLANEOUS)
SUCTION TUBE FRAZIER 10FR DISP (MISCELLANEOUS) IMPLANT
SUT ETHILON 3 0 PS 1 (SUTURE) ×3 IMPLANT
SUT MON AB 4-0 PC3 18 (SUTURE) IMPLANT
SUT PROLENE 3 0 PS 2 (SUTURE) IMPLANT
SUT VIC AB 2-0 SH 27 (SUTURE)
SUT VIC AB 2-0 SH 27XBRD (SUTURE) IMPLANT
SUT VIC AB 3-0 FS2 27 (SUTURE) IMPLANT
SYR BULB 3OZ (MISCELLANEOUS) ×3 IMPLANT
SYR CONTROL 10ML LL (SYRINGE) ×3 IMPLANT
TOWEL OR 17X24 6PK STRL BLUE (TOWEL DISPOSABLE) ×3 IMPLANT
TOWEL OR NON WOVEN STRL DISP B (DISPOSABLE) ×3 IMPLANT
TRAY DSU PREP LF (CUSTOM PROCEDURE TRAY) IMPLANT
TUBE CONNECTING 20'X1/4 (TUBING) ×1
TUBE CONNECTING 20X1/4 (TUBING) ×2 IMPLANT
UNDERPAD 30X30 (UNDERPADS AND DIAPERS) ×3 IMPLANT
YANKAUER SUCT BULB TIP NO VENT (SUCTIONS) ×3 IMPLANT

## 2017-02-14 NOTE — Discharge Instructions (Signed)
Diet: As you were doing prior to hospitalization   Dressing:  Leave dressing on and dry until follow up.  Activity:  Increase activity slowly as tolerated, but follow the weight bearing instructions below.  The rules on driving is that you can not be taking narcotics while you drive, and you must feel in control of the vehicle.    Weight Bearing:   As tolerated in boot    To prevent constipation: you may use a stool softener such as -  Colace (over the counter) 100 mg by mouth twice a day  Drink plenty of fluids (prune juice may be helpful) and high fiber foods Miralax (over the counter) for constipation as needed.    Itching:  If you experience itching with your medications, try taking only a single pain pill, or even half a pain pill at a time.  You may take up to 10 pain pills per day, and you can also use benadryl over the counter for itching or also to help with sleep.   Precautions:  If you experience chest pain or shortness of breath - call 911 immediately for transfer to the hospital emergency department!!  If you develop a fever greater that 101 F, purulent drainage from wound, increased redness or drainage from wound, or calf pain -- Call the office at [phone]                                                Follow- Up Appointment:  Please call for an appointment to be seen in 2 weeks [place] - [phone]       Post Anesthesia Home Care Instructions  Activity: Get plenty of rest for the remainder of the day. A responsible individual must stay with you for 24 hours following the procedure.  For the next 24 hours, DO NOT: -Drive a car -Paediatric nurse -Drink alcoholic beverages -Take any medication unless instructed by your physician -Make any legal decisions or sign important papers.  Meals: Start with liquid foods such as gelatin or soup. Progress to regular foods as tolerated. Avoid greasy, spicy, heavy foods. If nausea and/or vomiting occur, drink only  clear liquids until the nausea and/or vomiting subsides. Call your physician if vomiting continues.  Special Instructions/Symptoms: Your throat may feel dry or sore from the anesthesia or the breathing tube placed in your throat during surgery. If this causes discomfort, gargle with warm salt water. The discomfort should disappear within 24 hours.  If you had a scopolamine patch placed behind your ear for the management of post- operative nausea and/or vomiting:  1. The medication in the patch is effective for 72 hours, after which it should be removed.  Wrap patch in a tissue and discard in the trash. Wash hands thoroughly with soap and water. 2. You may remove the patch earlier than 72 hours if you experience unpleasant side effects which may include dry mouth, dizziness or visual disturbances. 3. Avoid touching the patch. Wash your hands with soap and water after contact with the patch.

## 2017-02-14 NOTE — Transfer of Care (Signed)
  Last Vitals:  Vitals:   02/14/17 0751 02/14/17 1027  BP: 133/90 136/66  Pulse: 71 72  Resp: 18 12  Temp: 36.9 C     Last Pain:  Vitals:   02/14/17 0751  TempSrc: Oral        Immediate Anesthesia Transfer of Care Note  Patient: Jessica Glenn  Procedure(s) Performed: Procedure(s) (LRB): RIGHT IRRIGATION AND DEBRIDEMENT ANKLE (Right) RIGHT ANKLE HARDWARE REMOVAL (Right)  Patient Location: PACU  Anesthesia Type: General  Level of Consciousness: awake, alert  and oriented  Airway & Oxygen Therapy: Patient Spontanous Breathing and Patient connected to face mask oxygen  Post-op Assessment: Report given to PACU RN and Post -op Vital signs reviewed and stable  Post vital signs: Reviewed and stable  Complications: No apparent anesthesia complications

## 2017-02-14 NOTE — Interval H&P Note (Signed)
History and Physical Interval Note:  02/14/2017 9:35 AM  Jessica Glenn  has presented today for surgery, with the diagnosis of MECHANICAL COMPLICATIONS, RIGHT ANKLE INFECTION  The various methods of treatment have been discussed with the patient and family. After consideration of risks, benefits and other options for treatment, the patient has consented to  Procedure(s): RIGHT IRRIGATION AND DEBRIDEMENT ANKLE (Right) RIGHT ANKLE HARDWARE REMOVAL (Right) as a surgical intervention .  The patient's history has been reviewed, patient examined, no change in status, stable for surgery.  I have reviewed the patient's chart and labs.  Questions were answered to the patient's satisfaction.     Alcide Memoli D

## 2017-02-14 NOTE — Anesthesia Postprocedure Evaluation (Signed)
Anesthesia Post Note  Patient: Jessica Glenn  Procedure(s) Performed: Procedure(s) (LRB): RIGHT IRRIGATION AND DEBRIDEMENT ANKLE (Right) RIGHT ANKLE HARDWARE REMOVAL (Right)  Patient location during evaluation: PACU Anesthesia Type: General Level of consciousness: sedated and patient cooperative Pain management: pain level controlled Vital Signs Assessment: post-procedure vital signs reviewed and stable Respiratory status: spontaneous breathing Cardiovascular status: stable Anesthetic complications: no       Last Vitals:  Vitals:   02/14/17 1130 02/14/17 1215  BP: 132/62 (!) 149/65  Pulse: 66 66  Resp: 15 16  Temp:  36.4 C    Last Pain:  Vitals:   02/14/17 1215  TempSrc:   PainSc: 2                  Nolon Nations

## 2017-02-14 NOTE — Anesthesia Procedure Notes (Signed)
Procedure Name: LMA Insertion Date/Time: 02/14/2017 9:43 AM Performed by: Nolon Nations Pre-anesthesia Checklist: Patient identified, Emergency Drugs available, Suction available and Patient being monitored Patient Re-evaluated:Patient Re-evaluated prior to inductionOxygen Delivery Method: Circle system utilized Preoxygenation: Pre-oxygenation with 100% oxygen Intubation Type: IV induction Ventilation: Mask ventilation without difficulty LMA: LMA inserted LMA Size: 4.0 Number of attempts: 1 Airway Equipment and Method: Bite block Placement Confirmation: positive ETCO2 Tube secured with: Tape Dental Injury: Teeth and Oropharynx as per pre-operative assessment

## 2017-02-14 NOTE — Op Note (Signed)
02/14/2017  5:23 PM  PATIENT:  Jessica Glenn    PRE-OPERATIVE DIAGNOSIS:  MECHANICAL COMPLICATIONS, RIGHT ANKLE INFECTION  POST-OPERATIVE DIAGNOSIS:  Same  PROCEDURE:  RIGHT IRRIGATION AND DEBRIDEMENT ANKLE, RIGHT ANKLE HARDWARE REMOVAL  SURGEON:  Chevon Fomby D, MD  ASSISTANT: none  ANESTHESIA:   gen  PREOPERATIVE INDICATIONS:  Jessica Glenn is a  83 y.o. female with a diagnosis of MECHANICAL COMPLICATIONS, RIGHT ANKLE INFECTION who failed conservative measures and elected for surgical management.    The risks benefits and alternatives were discussed with the patient preoperatively including but not limited to the risks of infection, bleeding, nerve injury, cardiopulmonary complications, the need for revision surgery, among others, and the patient was willing to proceed.  OPERATIVE IMPLANTS: none  OPERATIVE FINDINGS: Nylon stitch piece in his proximal wound  BLOOD LOSS: min  COMPLICATIONS: none  TOURNIQUET TIME: 48min  OPERATIVE PROCEDURE:  Patient was identified in the preoperative holding area and site was marked by me She was transported to the operating theater and placed on the table in supine position taking care to pad all bony prominences. After a preincinduction time out anesthesia was induced. The right lower extremity was prepped and draped in normal sterile fashion and a pre-incision timeout was performed. She received ancef for preoperative antibiotics.   I incised to her previous incision was a small collection of reactive fluid at the superior aspect. I did identify 2 pieces of nylon stitch that I removed. There are no other pockets of fluid. I did send some tissue for culture. I extended the incision distally where removed both of her medial Mal screws they came out intact. I then performed a thorough irrigation with copious fluid.  There was no additional pockets of fluid. Ankle was stable fracture was well-healed.  After this thorough irrigation I closed her  wound with interrupted stitches. Sterile dressings were applied she was awoken and taken the PACU in stable condition.  POST OPERATIVE PLAN: WBAT, mobilize for dvt px

## 2017-02-14 NOTE — Anesthesia Preprocedure Evaluation (Signed)
Anesthesia Evaluation  Patient identified by MRN, date of birth, ID band Patient awake    Reviewed: Allergy & Precautions, NPO status , Patient's Chart, lab work & pertinent test results  Airway Mallampati: I  TM Distance: >3 FB Neck ROM: Full    Dental  (+) Teeth Intact, Dental Advisory Given   Pulmonary former smoker,    breath sounds clear to auscultation       Cardiovascular  Rhythm:Regular Rate:Normal     Neuro/Psych    GI/Hepatic   Endo/Other    Renal/GU      Musculoskeletal   Abdominal   Peds  Hematology   Anesthesia Other Findings   Reproductive/Obstetrics                             Anesthesia Physical  Anesthesia Plan  ASA: II  Anesthesia Plan: General   Post-op Pain Management:    Induction: Intravenous  Airway Management Planned: LMA  Additional Equipment:   Intra-op Plan:   Post-operative Plan: Extubation in OR  Informed Consent: I have reviewed the patients History and Physical, chart, labs and discussed the procedure including the risks, benefits and alternatives for the proposed anesthesia with the patient or authorized representative who has indicated his/her understanding and acceptance.   Dental advisory given  Plan Discussed with: CRNA  Anesthesia Plan Comments:         Anesthesia Quick Evaluation

## 2017-02-16 ENCOUNTER — Encounter (HOSPITAL_BASED_OUTPATIENT_CLINIC_OR_DEPARTMENT_OTHER): Payer: Self-pay | Admitting: Orthopedic Surgery

## 2017-02-19 LAB — AEROBIC/ANAEROBIC CULTURE (SURGICAL/DEEP WOUND)

## 2017-02-19 LAB — AEROBIC/ANAEROBIC CULTURE W GRAM STAIN (SURGICAL/DEEP WOUND)

## 2017-05-31 HISTORY — PX: BREAST BIOPSY: SHX20

## 2017-06-13 ENCOUNTER — Other Ambulatory Visit: Payer: Self-pay | Admitting: Family Medicine

## 2017-06-13 DIAGNOSIS — N63 Unspecified lump in unspecified breast: Secondary | ICD-10-CM

## 2017-06-19 ENCOUNTER — Other Ambulatory Visit: Payer: Self-pay | Admitting: Family Medicine

## 2017-06-19 ENCOUNTER — Ambulatory Visit
Admission: RE | Admit: 2017-06-19 | Discharge: 2017-06-19 | Disposition: A | Discharge status: Home / Self Care | Payer: Medicare Other | Source: Ambulatory Visit | Attending: Family Medicine | Admitting: Family Medicine

## 2017-06-19 DIAGNOSIS — N63 Unspecified lump in unspecified breast: Secondary | ICD-10-CM

## 2017-06-19 DIAGNOSIS — R599 Enlarged lymph nodes, unspecified: Secondary | ICD-10-CM

## 2017-06-22 ENCOUNTER — Ambulatory Visit
Admission: RE | Admit: 2017-06-22 | Discharge: 2017-06-22 | Disposition: A | Discharge status: Home / Self Care | Payer: Medicare Other | Source: Ambulatory Visit | Attending: Family Medicine | Admitting: Family Medicine

## 2017-06-22 ENCOUNTER — Other Ambulatory Visit: Payer: Self-pay | Admitting: Family Medicine

## 2017-06-22 DIAGNOSIS — N63 Unspecified lump in unspecified breast: Secondary | ICD-10-CM

## 2017-06-22 DIAGNOSIS — R599 Enlarged lymph nodes, unspecified: Secondary | ICD-10-CM

## 2017-06-27 ENCOUNTER — Telehealth: Payer: Self-pay | Admitting: Oncology

## 2017-06-27 NOTE — Telephone Encounter (Signed)
Appt has been scheduled for the pt to see Dr. Jana Hakim on 8/30 at 8am. Pt aware to arrive 15 minutes early.

## 2017-06-29 ENCOUNTER — Ambulatory Visit (HOSPITAL_BASED_OUTPATIENT_CLINIC_OR_DEPARTMENT_OTHER): Payer: Medicare Other | Admitting: Oncology

## 2017-06-29 ENCOUNTER — Encounter: Payer: Self-pay | Admitting: *Deleted

## 2017-06-29 DIAGNOSIS — Z170 Estrogen receptor positive status [ER+]: Secondary | ICD-10-CM | POA: Diagnosis not present

## 2017-06-29 DIAGNOSIS — C50912 Malignant neoplasm of unspecified site of left female breast: Secondary | ICD-10-CM | POA: Insufficient documentation

## 2017-06-29 DIAGNOSIS — C50812 Malignant neoplasm of overlapping sites of left female breast: Secondary | ICD-10-CM | POA: Diagnosis not present

## 2017-06-29 NOTE — Progress Notes (Signed)
[facility]  [phone] Fax:([phone]) [phone]     ID: Jessica Glenn DOB: 1929-05-23  MR#: [id number]  LAG#:[id number]  Patient Care Team: Patient, No Pcp Per as PCP - General (General Practice) Chauncey Cruel, MD OTHER MD:  CHIEF COMPLAINT: Locally advanced estrogen receptor positive left breast cancer  CURRENT TREATMENT: Neoadjuvant fulvestrant   HISTORY OF CURRENT ILLNESS: Jessica Glenn tells me she first noted a changes in her left breast about a year and a half ago. She had had some dental work around that time and thought possibly that could be related. She said her breast felt "like steel". She did not bring it to medical attention until her recent appointment with Dr. Alyson Ingles and he set her up for bilateral diagnostic mammography with tomography and left breast ultrasonography at the [facility] 06/19/2017. This found the breast density to be category C. In the upper outer retroareolar left breast there was an irregular mass estimated to be at least 6 cm by mammography. There were heterogeneous calcifications within the mass. There was skin thickening of the areola and an enlarged inferior left axillary lymph node was noted. On exam there was a large fixed hard mass in the upper outer quadrant of the left breast causing protrusion of the areola. It measured approximately 7 cm by palpation and there was visible skin thickening. In the inferior left axilla there was a firm palpable mass measuring 2 cm.  Ultrasound confirmed an irregular hypoechoic vascular mass in the left breast centered on the 1:00 position 4 cm from the nipple measuring 6.7 cm. This was abutting the overlying skin. It was a 2.3 cm hypoechoic vascular mass in the left axilla. The right breast was unremarkable.  On 06/22/2017 the patient had left breast and left axillary node biopsy, showing ([id number]) both sites to be involved by invasive ductal carcinoma, grade 2, with prognostic panels on both  biopsies showing estrogen receptor 100% positivity, progesterone receptor 40-50% positivity, all with strong staining intensity, MIB-1:30 percent in the breast and 15% in the lymph nodes, and both HER-2 negative, with ratios of 0.76-0.85 and number per cell 1.10-1.30.  The patient's subsequent history is as detailed below.  INTERVAL HISTORY: Jessica Glenn was evaluated in the multidisciplinary breast cancer clinic 06/29/2017 accompanied by her husband peaked and her daughter in law Jessica Glenn and this is married to the patient's son Jessica Glenn) Her case was also presented at the multidisciplinary breast cancer conference on 06/28/2017. At that time a preliminary plan was proposed: Neoadjuvant systemic treatment, likely hormones, with consideration of chemotherapy, and eventual surgery followed by radiation. Staging studies were also suggested  REVIEW OF SYSTEMS: Aside from the mass itself, the patient reports no specific symptoms suggestive of metastatic disease. The patient denies unusual headaches, visual changes, nausea, vomiting, stiff neck, dizziness, or gait imbalance. There has been no cough, phlegm production, or pleurisy, no chest pain or pressure, and no change in bowel or bladder habits. The patient denies fever, rash, bleeding, unexplained fatigue or unexplained weight loss. Analiya has a functional status consistent with her age. She does not exercise regularly. She is independent in all activities of daily living. There have been no recent falls. A detailed review of systems was otherwise entirely negative.    PAST MEDICAL HISTORY: Past Medical History:  Diagnosis Date  . Ankle fracture, right   . Bilateral cataracts    s/p surgery  . History of tobacco use   . Hx of skin cancer, basal cell  s/p Moh's surgery   . Painful orthopaedic hardware ([place])    rt ankle    PAST SURGICAL HISTORY: Past Surgical History:  Procedure Laterality Date  . CATARACT EXTRACTION     both eyes  . HARDWARE REMOVAL  Right 02/14/2017   Procedure: RIGHT ANKLE HARDWARE REMOVAL;  Surgeon: [name], MD;  Location: [facility];  Service: Orthopedics;  Laterality: Right;  . I&D EXTREMITY Right 02/14/2017   Procedure: RIGHT IRRIGATION AND DEBRIDEMENT ANKLE;  Surgeon: [name], MD;  Location: [facility];  Service: Orthopedics;  Laterality: Right;  . ORIF ANKLE FRACTURE Right 02/02/2016   Procedure: OPEN REDUCTION INTERNAL FIXATION (ORIF) ANKLE FRACTURE;  Surgeon: [name], MD;  Location: [facility];  Service: Orthopedics;  Laterality: Right;  stryker mini c arm reg bed bone foam    FAMILY HISTORY Family History  Problem Relation Age of Onset  . COPD Mother   The patient's father had a history of alcoholism and a band in the family. The patient has no information on the paternal side. The patient's mother died from emphysema at the age of 69. The patient has one sister, 2 brothers. There is no history of breast or ovarian cancer in the family to her knowledge.  GYNECOLOGIC HISTORY:  No LMP recorded. Patient is postmenopausal. Menarche age 58, first live birth age 36, the patient is [place] P4. She went through the change of life in her 28s. She took hormone replacement for a few years.   SOCIAL HISTORY:  The patient is originally from [place]. Her husband Peteis a traveling Theme park manager, nondenominational. Son peaked lives in [place] where he works as a Theme park manager; son Jessica Glenn lives in [place] where he works as an Optometrist; son Jessica Glenn lives in [place] where he works as a Chief Strategy Officer; son Jessica Glenn also lives in [place] and works as a Chief Strategy Officer. The patient has 14 grandchildren and 23 great-grandchildren    ADVANCED DIRECTIVES: In place   HEALTH MAINTENANCE: Social History  Substance Use Topics  . Smoking status: Former Research scientist (life sciences)  . Smokeless tobacco: Never Used  . Alcohol use 0.0 oz/week     Comment: social     Colonoscopy:Never  PAP:  Bone  density:Osteopenia   No Known Allergies  Current Outpatient Prescriptions  Medication Sig Dispense Refill  . diphenhydramine-acetaminophen (TYLENOL PM) 25-500 MG TABS tablet Take 1 tablet by mouth at bedtime as needed.     No current facility-administered medications for this visit.     OBJECTIVE:Older white woman in no acute distress  Vitals:   06/29/17 0818  BP: (!) 159/89  Pulse: 73  Resp: 18  Temp: 98.3 F (36.8 C)  SpO2: 97%     Body mass index is 25.52 kg/m.   Wt Readings from Last 3 Encounters:  06/29/17 158 lb 1.6 oz (71.7 kg)  02/14/17 155 lb (70.3 kg)  02/02/16 164 lb (74.4 kg)      ECOG FS:1 - Symptomatic but completely ambulatory  Ocular: Sclerae unicteric, pupils round and equal Ear-nose-throat: Oropharynx clear and moist Lymphatic: No cervical or supraclavicular adenopathy Lungs no rales or rhonchi Heart regular rate and rhythm Abd soft, nontender, positive bowel sounds MSK no focal spinal tenderness, no joint edema Neuro: non-focal, well-oriented, pleasant  affect Breasts: The right breast is unremarkable. The left breast is imaged below. Most of the superior breast is taken up by a mass that measures approximately 3 inches, and appears movable. There is no overlying erythema. There is  palpable left axillary adenopathy.  Left breast 06/29/2017     LAB RESULTS:  CMP     Component Value Date/Time   NA 140 01/28/2016 0459   K 4.5 01/28/2016 0459   CL 108 01/28/2016 0459   CO2 26 01/28/2016 0459   GLUCOSE 126 (H) 01/28/2016 0459   BUN 18 01/28/2016 0459   CREATININE 0.95 01/28/2016 0459   CALCIUM 8.4 (L) 01/28/2016 0459   PROT 6.7 01/27/2016 1504   ALBUMIN 3.9 01/27/2016 1504   AST 24 01/27/2016 1504   ALT 13 (L) 01/27/2016 1504   ALKPHOS 58 01/27/2016 1504   BILITOT 0.7 01/27/2016 1504   GFRNONAA 53 (L) 01/28/2016 0459   GFRAA >60 01/28/2016 0459    No results found for: TOTALPROTELP, ALBUMINELP, A1GS, A2GS, BETS, BETA2SER, GAMS, MSPIKE,  SPEI  No results found for: Nils Pyle, [facility]  Lab Results  Component Value Date   WBC 6.8 01/28/2016   NEUTROABS 4.2 01/28/2016   HGB 12.2 01/28/2016   HCT 36.5 01/28/2016   MCV 91.7 01/28/2016   PLT 124 (L) 01/28/2016      Chemistry      Component Value Date/Time   NA 140 01/28/2016 0459   K 4.5 01/28/2016 0459   CL 108 01/28/2016 0459   CO2 26 01/28/2016 0459   BUN 18 01/28/2016 0459   CREATININE 0.95 01/28/2016 0459      Component Value Date/Time   CALCIUM 8.4 (L) 01/28/2016 0459   ALKPHOS 58 01/27/2016 1504   AST 24 01/27/2016 1504   ALT 13 (L) 01/27/2016 1504   BILITOT 0.7 01/27/2016 1504       No results found for: LABCA2  No components found for: CZYSAY301  No results for input(s): INR in the last 168 hours.  No results found for: LABCA2  No results found for: SWF093  No results found for: ATF573  No results found for: UKG254  No results found for: CA2729  No components found for: HGQUANT  No results found for: CEA1 / No results found for: CEA1   No results found for: AFPTUMOR  No results found for: CHROMOGRNA  No results found for: PSA1  No visits with results within 3 Day(s) from this visit.  Latest known visit with results is:  Admission on 02/14/2017, Discharged on 02/14/2017  Component Date Value Ref Range Status  . Specimen Description 02/14/2017 TISSUE RIGHT LEG   Final  . Special Requests 02/14/2017 PATIENT ON FOLLOWING  KEFLEX AND ANCEF   Final  . Gram Stain 02/14/2017    Final                   Value:MODERATE WBC PRESENT, PREDOMINANTLY PMN NO ORGANISMS SEEN   . Culture 02/14/2017    Final                   Value:RARE STAPHYLOCOCCUS SPECIES (COAGULASE NEGATIVE) FEW STAPHYLOCOCCUS EPIDERMIDIS Susceptibility patterns are not the same. NO ANAEROBES ISOLATED CRITICAL RESULT CALLED TO, READ BACK BY AND VERIFIED WITH: HC MARTENSEN,PA AT 1413 02/15/17 BY D VANHOOK CONCERNING GROWTH ON CULTURE   . Report Status  02/14/2017 02/19/2017 FINAL   Final  . Organism ID, Bacteria 02/14/2017 STAPHYLOCOCCUS SPECIES (COAGULASE NEGATIVE)   Final  . Organism ID, Bacteria 02/14/2017 STAPHYLOCOCCUS EPIDERMIDIS   Final    (this displays the last labs from the last 3 days)  No results found for: TOTALPROTELP, ALBUMINELP, A1GS, A2GS, BETS, BETA2SER, GAMS, MSPIKE, SPEI (this displays SPEP labs)  No results  found for: KPAFRELGTCHN, LAMBDASER, KAPLAMBRATIO (kappa/lambda light chains)  No results found for: HGBA, HGBA2QUANT, HGBFQUANT, HGBSQUAN (Hemoglobinopathy evaluation)   No results found for: LDH  No results found for: IRON, TIBC, IRONPCTSAT (Iron and TIBC)  No results found for: FERRITIN  Urinalysis No results found for: COLORURINE, APPEARANCEUR, LABSPEC, PHURINE, GLUCOSEU, HGBUR, BILIRUBINUR, KETONESUR, PROTEINUR, UROBILINOGEN, NITRITE, LEUKOCYTESUR   STUDIES: US Breast Ltd Uni Left Inc Axilla  Result Date: 06/19/2017 CLINICAL DATA:  83 year old patient presents for evaluation of a palpable mass in the left breast. She has not had a mammogram in greater than 30 years. EXAM: 2D DIGITAL DIAGNOSTIC BILATERAL MAMMOGRAM WITH CAD AND ADJUNCT TOMO ULTRASOUND LEFT BREAST COMPARISON:  None ACR Breast Density Category c: The breast tissue is heterogeneously dense, which may obscure small masses. FINDINGS: Metallic skin marker was placed over the region of patient concern in the outer left breast. There is a large, irregular mass in the upper-outer retroareolar/periareolar left breast. On mammography, the mass is estimated to be 6 cm greatest diameter. Within the mass there is a focal area of clustered heterogeneous calcifications. There is skin thickening of the areola and periareolar left breast. Partially visualized is a enlarged inferior left axillary lymph node. There are no findings to suggest malignancy in the right breast. Mammographic images were processed with CAD. On physical exam, there is a large fixed, hard  mass in the upper outer quadrant of the left breast, with visible mass causing protrusion of the areola. On physical exam, the mass measures 6 to 7 cm greatest diameter. Visible skin thickening of the periareolar left breast. There is a firm palpable mass in the inferior left axilla measuring approximately 2 cm in size. Targeted ultrasound is performed, showing an irregular heterogeneously hypoechoic vascular mass in the left breast centered at 1 o'clock position 4 cm from the nipple measuring approximately 6.7 x 5.9 x 3.4 cm. A portion of the mass abuts and may involve the overlying skin. There is skin thickening in this region. There is is a 2.3 x 1.9 x 2.0 cm hypoechoic vascular mass corresponding to the palpable inferior left axillary mass. There is no normal visible nodule architecture, but this is suspected to be a lymph node involved with metastasis. IMPRESSION: Findings highly suspicious for locally advanced breast carcinoma with replacement of a palpable left axillary lymph node with metastatic disease. No evidence of malignancy in the right breast. RECOMMENDATION: Ultrasound-guided biopsies of the palpable left breast mass and the palpable left axillary lymph node are recommended and have been scheduled for Thursday June 22, 2017 at 2:45 p.m. I have discussed the findings and recommendations with the patient. Results were also provided in writing at the conclusion of the visit. If applicable, a reminder letter will be sent to the patient regarding the next appointment. BI-RADS CATEGORY  5: Highly suggestive of malignancy. Electronically Signed   By: Curlene Dolphin M.D.   On: 06/19/2017 10:08   Mm Diag Breast Tomo Bilateral  Result Date: 06/19/2017 CLINICAL DATA:  83 year old patient presents for evaluation of a palpable mass in the left breast. She has not had a mammogram in greater than 30 years. EXAM: 2D DIGITAL DIAGNOSTIC BILATERAL MAMMOGRAM WITH CAD AND ADJUNCT TOMO ULTRASOUND LEFT BREAST  COMPARISON:  None ACR Breast Density Category c: The breast tissue is heterogeneously dense, which may obscure small masses. FINDINGS: Metallic skin marker was placed over the region of patient concern in the outer left breast. There is a large, irregular mass in the upper-outer retroareolar/periareolar  left breast. On mammography, the mass is estimated to be 6 cm greatest diameter. Within the mass there is a focal area of clustered heterogeneous calcifications. There is skin thickening of the areola and periareolar left breast. Partially visualized is a enlarged inferior left axillary lymph node. There are no findings to suggest malignancy in the right breast. Mammographic images were processed with CAD. On physical exam, there is a large fixed, hard mass in the upper outer quadrant of the left breast, with visible mass causing protrusion of the areola. On physical exam, the mass measures 6 to 7 cm greatest diameter. Visible skin thickening of the periareolar left breast. There is a firm palpable mass in the inferior left axilla measuring approximately 2 cm in size. Targeted ultrasound is performed, showing an irregular heterogeneously hypoechoic vascular mass in the left breast centered at 1 o'clock position 4 cm from the nipple measuring approximately 6.7 x 5.9 x 3.4 cm. A portion of the mass abuts and may involve the overlying skin. There is skin thickening in this region. There is is a 2.3 x 1.9 x 2.0 cm hypoechoic vascular mass corresponding to the palpable inferior left axillary mass. There is no normal visible nodule architecture, but this is suspected to be a lymph node involved with metastasis. IMPRESSION: Findings highly suspicious for locally advanced breast carcinoma with replacement of a palpable left axillary lymph node with metastatic disease. No evidence of malignancy in the right breast. RECOMMENDATION: Ultrasound-guided biopsies of the palpable left breast mass and the palpable left axillary lymph  node are recommended and have been scheduled for Thursday June 22, 2017 at 2:45 p.m. I have discussed the findings and recommendations with the patient. Results were also provided in writing at the conclusion of the visit. If applicable, a reminder letter will be sent to the patient regarding the next appointment. BI-RADS CATEGORY  5: Highly suggestive of malignancy. Electronically Signed   By: Curlene Dolphin M.D.   On: 06/19/2017 10:08   [place] Axillary Node Core Biopsy Left  Addendum Date: 06/23/2017   ADDENDUM REPORT: 06/23/2017 14:20 ADDENDUM: Pathology revealed GRADE II INVASIVE DUCTAL CARCINOMA, DUCTAL CARCINOMA IN SITU of the Left breast, upper outer quadrant. METASTATIC CARCINOMA of the Left axillary lymph node. This was found to be concordant by Dr. Kristopher Oppenheim. Pathology results were discussed with the patient by telephone. The patient reported doing well after the biopsies with tenderness at the sites. Post biopsy instructions and care were reviewed and questions were answered. The patient was encouraged to call [facility] for any additional concerns. Surgical consultation has been arranged with Dr. Autumn Messing at [facility] on June 26, 2017. Pathology results reported by Terie Purser, RN on 06/23/2017. Electronically Signed   By: Kristopher Oppenheim M.D.   On: 06/23/2017 14:20   Result Date: 06/23/2017 CLINICAL DATA:  83 year old female with left breast mass and left axillary lymphadenopathy. EXAM: ULTRASOUND GUIDED CORE NEEDLE BIOPSY OF A LEFT AXILLARY NODE COMPARISON:  Previous exam(s). FINDINGS: I met with the patient and we discussed the procedure of ultrasound-guided biopsy, including benefits and alternatives. We discussed the high likelihood of a successful procedure. We discussed the risks of the procedure, including infection, bleeding, tissue injury, clip migration, and inadequate sampling. Informed written consent was given. The usual time-out protocol  was performed immediately prior to the procedure. Using sterile technique and 1% Lidocaine as local anesthetic, under direct ultrasound visualization, a 14 gauge spring-loaded device was used to perform biopsy  of a left axillary lymph node using a inferior approach. At the conclusion of the procedure a coil shaped tissue marker clip was deployed into the biopsy cavity. Follow up 2 view mammogram was performed and dictated separately. IMPRESSION: Ultrasound guided biopsy of a left axillary lymph node. No apparent complications. Electronically Signed: By: Kristopher Oppenheim M.D. On: 06/22/2017 16:42   Mm Clip Placement Left  Addendum Date: 06/23/2017   ADDENDUM REPORT: 06/23/2017 14:20 ADDENDUM: Pathology revealed GRADE II INVASIVE DUCTAL CARCINOMA, DUCTAL CARCINOMA IN SITU of the Left breast, upper outer quadrant. METASTATIC CARCINOMA of the Left axillary lymph node. This was found to be concordant by Dr. Kristopher Oppenheim. Pathology results were discussed with the patient by telephone. The patient reported doing well after the biopsies with tenderness at the sites. Post biopsy instructions and care were reviewed and questions were answered. The patient was encouraged to call [facility] for any additional concerns. Surgical consultation has been arranged with Dr. Autumn Messing at [facility] on June 26, 2017. Pathology results reported by Terie Purser, RN on 06/23/2017. Electronically Signed   By: Kristopher Oppenheim M.D.   On: 06/23/2017 14:20   Result Date: 06/23/2017 CLINICAL DATA:  Clip films status post ultrasound-guided biopsy of a left breast mass and left axillary lymph node. EXAM: DIAGNOSTIC LEFT MAMMOGRAM POST ULTRASOUND BIOPSY COMPARISON:  Previous exam(s). FINDINGS: Mammographic images were obtained following ultrasound guided biopsy of a left breast mass and left axillary lymph node. A ribbon shaped clip is identified with in the left breast mass. A coil shaped clip is  identified within the axillary lymph node. IMPRESSION: Ribbon shaped left breast post biopsy clip and coil shaped left axillary post biopsy clip in expected location status post ultrasound-guided biopsy. Final Assessment: Post Procedure Mammograms for Marker Placement Electronically Signed: By: Kristopher Oppenheim M.D. On: 06/22/2017 16:43   [place] Lt Breast Bx W Loc Dev 1st Lesion Img Bx Spec US Guide  Result Date: 06/22/2017 CLINICAL DATA:  83 year old female with left breast mass and left axillary lymphadenopathy. EXAM: ULTRASOUND GUIDED LEFT BREAST CORE NEEDLE BIOPSY COMPARISON:  Previous exam(s). FINDINGS: I met with the patient and we discussed the procedure of ultrasound-guided biopsy, including benefits and alternatives. We discussed the high likelihood of a successful procedure. We discussed the risks of the procedure, including infection, bleeding, tissue injury, clip migration, and inadequate sampling. Informed written consent was given. The usual time-out protocol was performed immediately prior to the procedure. Lesion quadrant: Upper-outer quadrant. Using sterile technique and 1% Lidocaine as local anesthetic, under direct ultrasound visualization, a 12 gauge spring-loaded device was used to perform biopsy of left breast mass using a inferior approach. At the conclusion of the procedure a ribbon shaped tissue marker clip was deployed into the biopsy cavity. Follow up 2 view mammogram was performed and dictated separately. IMPRESSION: Ultrasound guided biopsy of a left breast mass. No apparent complications. Electronically Signed   By: Kristopher Oppenheim M.D.   On: 06/22/2017 16:41    ELIGIBLE FOR AVAILABLE RESEARCH PROTOCOL: no ASSESSMENT: 83 y.o. [place] woman status post left breast overlapping sites biopsy 06/22/2017 for a clinical T3 N1-2, stage II-III invasive ductal carcinoma, grade 2, estrogen and progesterone receptor positive, HER-2 nonamplified, with an MIB-1 of 30%  (1) to start  fulvestrant 07/05/2017  (2) definitive surgery to follow  (3) adjuvant radiation to follow surgery  (4) continue on anti-estrogens a minimum of 5 years  PLAN: We spent the better part  of today's hour-long appointment discussing the biology of her diagnosis and the specifics of her situation. We first reviewed the fact that cancer is not one disease but more than 100 different diseases and that it is important to keep them separate-- otherwise when friends and relatives discuss their own cancer experiences with [place] confusion can result. Similarly we explained that if breast cancer spreads to the bone or liver, the patient would not have bone cancer or liver cancer, but breast cancer in the bone and breast cancer in the liver: one cancer in three places-- not 3 different cancers which otherwise would have to be treated in 3 different ways.  We discussed the difference between local and systemic therapy. In terms of loco-regional treatment, lumpectomy plus radiation is equivalent to mastectomy as far as survival is concerned. However in her case it will be difficult to avoid mastectomy. We also noted that in terms of sequencing of treatments, whether systemic therapy or surgery is done first does not affect the ultimate outcome. Our suggestion is that in her case we start systemic therapy neoadjuvantly, to facilitate the surgery later and if possible save the breast.  We then discussed the rationale for systemic therapy. There is some risk that this cancer may have already spread to other parts of her body. We will be doing staging studies including a chest CT, bone scan, and bone survey (looking for lytic lesions). However she understands even if these are all completely negative, they do not rule out occult spread of disease and indeed given her presentation I quoted her a 60% at least chance of already having occult disease elsewhere in her body. This is why she needs systemic therapy.  Next we went  over the options for systemic therapy which are anti-estrogens, anti-HER-2 immunotherapy, and chemotherapy. Joyia does not meet criteria for anti-HER-2 immunotherapy. She is a good candidate for anti-estrogens.  The question of chemotherapy is more complicated. Chemotherapy is most effective in rapidly growing, aggressive tumors. It is much less effective in not high, not very rapidly growing cancers, like Yarianna 's. In her case we are going to lean on anti-estrogens and use chemotherapy only depending on response. Certainly if the response to antiestrogen's is poor then after surgery we could consider adjuvant chemotherapy  The overall plan then is to start anti-estrogens now. We discussed all the available options. The most effective option I think in her case would be Faslodex and I discussed the possible toxicities, side effects and complications of this agent. I have scheduled her to receive the first dose on 07/05/2017. She should have her staging studies also next week. We will see her with her second dose, 2 weeks later, and then I will see her again late October.  We are going to follow her disease clinically by palpation and inspection and also by repeat ultrasounds. If we can get a good response after 3-12 months of anti-estrogens, she will proceed to definitive surgery and then radiation.  Lynzie has a good understanding of the overall plan. She agrees with it. She knows the goal of treatment in her case is cure. She will call with any problems that may develop before her next visit here.  Chauncey Cruel, MD   06/29/2017 8:23 AM Medical Oncology and Hematology [facility] [address] [place], [place] [address] Tel. [phone]    Fax. [phone]

## 2017-07-05 ENCOUNTER — Ambulatory Visit: Payer: Medicare Other | Admitting: Adult Health

## 2017-07-05 ENCOUNTER — Ambulatory Visit: Payer: Medicare Other

## 2017-07-05 ENCOUNTER — Other Ambulatory Visit (HOSPITAL_BASED_OUTPATIENT_CLINIC_OR_DEPARTMENT_OTHER): Payer: Medicare Other

## 2017-07-05 ENCOUNTER — Other Ambulatory Visit: Payer: Medicare Other

## 2017-07-05 ENCOUNTER — Ambulatory Visit (HOSPITAL_BASED_OUTPATIENT_CLINIC_OR_DEPARTMENT_OTHER): Payer: Medicare Other

## 2017-07-05 VITALS — BP 110/78 | HR 69 | Temp 98.3°F | Resp 18

## 2017-07-05 DIAGNOSIS — C50812 Malignant neoplasm of overlapping sites of left female breast: Secondary | ICD-10-CM

## 2017-07-05 DIAGNOSIS — Z5111 Encounter for antineoplastic chemotherapy: Secondary | ICD-10-CM

## 2017-07-05 DIAGNOSIS — Z170 Estrogen receptor positive status [ER+]: Principal | ICD-10-CM

## 2017-07-05 DIAGNOSIS — C50912 Malignant neoplasm of unspecified site of left female breast: Secondary | ICD-10-CM

## 2017-07-05 LAB — COMPREHENSIVE METABOLIC PANEL
ALT: 8 U/L (ref 0–55)
AST: 19 U/L (ref 5–34)
Albumin: 3.9 g/dL (ref 3.5–5.0)
Alkaline Phosphatase: 64 U/L (ref 40–150)
Anion Gap: 8 mEq/L (ref 3–11)
BILIRUBIN TOTAL: 0.47 mg/dL (ref 0.20–1.20)
BUN: 19.1 mg/dL (ref 7.0–26.0)
CO2: 26 meq/L (ref 22–29)
Calcium: 9.7 mg/dL (ref 8.4–10.4)
Chloride: 106 mEq/L (ref 98–109)
Creatinine: 0.9 mg/dL (ref 0.6–1.1)
EGFR: 55 mL/min/{1.73_m2} — AB (ref >90)
GLUCOSE: 97 mg/dL (ref 70–140)
Potassium: 4.7 mEq/L (ref 3.5–5.1)
SODIUM: 140 meq/L (ref 136–145)
TOTAL PROTEIN: 7.1 g/dL (ref 6.4–8.3)

## 2017-07-05 LAB — CBC WITH DIFFERENTIAL/PLATELET
BASO%: 0.8 % (ref 0.0–2.0)
BASOS ABS: 0.1 10*3/uL (ref 0.0–0.1)
EOS ABS: 0.2 10*3/uL (ref 0.0–0.5)
EOS%: 3.3 % (ref 0.0–7.0)
HCT: 41.3 % (ref 34.8–46.6)
HGB: 13.8 g/dL (ref 11.6–15.9)
LYMPH%: 23.7 % (ref 14.0–49.7)
MCH: 29.9 pg (ref 25.1–34.0)
MCHC: 33.3 g/dL (ref 31.5–36.0)
MCV: 89.8 fL (ref 79.5–101.0)
MONO#: 0.5 10*3/uL (ref 0.1–0.9)
MONO%: 8.1 % (ref 0.0–14.0)
NEUT%: 64.1 % (ref 38.4–76.8)
NEUTROS ABS: 4.3 10*3/uL (ref 1.5–6.5)
Platelets: 178 10*3/uL (ref 145–400)
RBC: 4.6 10*6/uL (ref 3.70–5.45)
RDW: 13.2 % (ref 11.2–14.5)
WBC: 6.7 10*3/uL (ref 3.9–10.3)
lymph#: 1.6 10*3/uL (ref 0.9–3.3)

## 2017-07-05 MED ORDER — FULVESTRANT 250 MG/5ML IM SOLN
500.0000 mg | Freq: Once | INTRAMUSCULAR | Status: AC
  Administered 2017-07-05: 500 mg via INTRAMUSCULAR
  Filled 2017-07-05: qty 10

## 2017-07-06 LAB — CANCER ANTIGEN 27.29: CAN 27.29: 314.8 U/mL — AB (ref 0.0–38.6)

## 2017-07-11 ENCOUNTER — Encounter (HOSPITAL_COMMUNITY)
Admission: RE | Admit: 2017-07-11 | Discharge: 2017-07-11 | Disposition: A | Discharge status: Home / Self Care | Payer: Medicare Other | Source: Ambulatory Visit | Attending: Oncology | Admitting: Oncology

## 2017-07-11 ENCOUNTER — Encounter (HOSPITAL_COMMUNITY): Payer: Self-pay

## 2017-07-11 ENCOUNTER — Ambulatory Visit (HOSPITAL_COMMUNITY)
Admission: RE | Admit: 2017-07-11 | Discharge: 2017-07-11 | Disposition: A | Discharge status: Home / Self Care | Payer: Medicare Other | Source: Ambulatory Visit | Attending: Oncology | Admitting: Oncology

## 2017-07-11 DIAGNOSIS — Z170 Estrogen receptor positive status [ER+]: Secondary | ICD-10-CM | POA: Diagnosis present

## 2017-07-11 DIAGNOSIS — C50812 Malignant neoplasm of overlapping sites of left female breast: Secondary | ICD-10-CM | POA: Diagnosis not present

## 2017-07-11 MED ORDER — IOPAMIDOL (ISOVUE-300) INJECTION 61%
INTRAVENOUS | Status: AC
  Filled 2017-07-11: qty 75

## 2017-07-11 MED ORDER — IOPAMIDOL (ISOVUE-300) INJECTION 61%
75.0000 mL | Freq: Once | INTRAVENOUS | Status: AC | PRN
  Administered 2017-07-11: 75 mL via INTRAVENOUS

## 2017-07-11 MED ORDER — TECHNETIUM TC 99M MEDRONATE IV KIT
25.0000 | PACK | Freq: Once | INTRAVENOUS | Status: AC | PRN
  Administered 2017-07-11: 19.9 via INTRAVENOUS

## 2017-07-13 ENCOUNTER — Other Ambulatory Visit: Payer: Self-pay | Admitting: Oncology

## 2017-07-13 NOTE — Progress Notes (Unsigned)
I called today and I'll let her know the news on her scans. She understands her some findings which are nonspecific and will require follow-up, but there is nothing right now that worries me regarding metastatic disease.

## 2017-07-19 ENCOUNTER — Ambulatory Visit (HOSPITAL_COMMUNITY)
Admission: RE | Admit: 2017-07-19 | Discharge: 2017-07-19 | Disposition: A | Discharge status: Home / Self Care | Payer: Medicare Other | Source: Ambulatory Visit | Attending: Adult Health | Admitting: Adult Health

## 2017-07-19 ENCOUNTER — Encounter: Payer: Self-pay | Admitting: Adult Health

## 2017-07-19 ENCOUNTER — Ambulatory Visit (HOSPITAL_BASED_OUTPATIENT_CLINIC_OR_DEPARTMENT_OTHER): Payer: Medicare Other | Admitting: Adult Health

## 2017-07-19 ENCOUNTER — Other Ambulatory Visit (HOSPITAL_BASED_OUTPATIENT_CLINIC_OR_DEPARTMENT_OTHER): Payer: Medicare Other

## 2017-07-19 ENCOUNTER — Telehealth: Payer: Self-pay | Admitting: Adult Health

## 2017-07-19 ENCOUNTER — Ambulatory Visit (HOSPITAL_BASED_OUTPATIENT_CLINIC_OR_DEPARTMENT_OTHER): Payer: Medicare Other

## 2017-07-19 VITALS — BP 168/75 | HR 70 | Temp 98.4°F | Resp 18 | Ht 66.0 in | Wt 157.2 lb

## 2017-07-19 DIAGNOSIS — Z170 Estrogen receptor positive status [ER+]: Principal | ICD-10-CM

## 2017-07-19 DIAGNOSIS — C50412 Malignant neoplasm of upper-outer quadrant of left female breast: Secondary | ICD-10-CM | POA: Diagnosis not present

## 2017-07-19 DIAGNOSIS — C50812 Malignant neoplasm of overlapping sites of left female breast: Secondary | ICD-10-CM

## 2017-07-19 DIAGNOSIS — M5134 Other intervertebral disc degeneration, thoracic region: Secondary | ICD-10-CM | POA: Insufficient documentation

## 2017-07-19 DIAGNOSIS — Z5111 Encounter for antineoplastic chemotherapy: Secondary | ICD-10-CM

## 2017-07-19 DIAGNOSIS — M899 Disorder of bone, unspecified: Secondary | ICD-10-CM | POA: Diagnosis present

## 2017-07-19 DIAGNOSIS — M5136 Other intervertebral disc degeneration, lumbar region: Secondary | ICD-10-CM | POA: Insufficient documentation

## 2017-07-19 DIAGNOSIS — C50912 Malignant neoplasm of unspecified site of left female breast: Secondary | ICD-10-CM

## 2017-07-19 DIAGNOSIS — C773 Secondary and unspecified malignant neoplasm of axilla and upper limb lymph nodes: Secondary | ICD-10-CM | POA: Diagnosis not present

## 2017-07-19 LAB — CBC WITH DIFFERENTIAL/PLATELET
BASO%: 0.7 % (ref 0.0–2.0)
Basophils Absolute: 0 10*3/uL (ref 0.0–0.1)
EOS%: 5.4 % (ref 0.0–7.0)
Eosinophils Absolute: 0.3 10*3/uL (ref 0.0–0.5)
HCT: 42.1 % (ref 34.8–46.6)
HEMOGLOBIN: 14.2 g/dL (ref 11.6–15.9)
LYMPH%: 34.3 % (ref 14.0–49.7)
MCH: 29.9 pg (ref 25.1–34.0)
MCHC: 33.7 g/dL (ref 31.5–36.0)
MCV: 88.7 fL (ref 79.5–101.0)
MONO#: 0.4 10*3/uL (ref 0.1–0.9)
MONO%: 8.3 % (ref 0.0–14.0)
NEUT%: 51.3 % (ref 38.4–76.8)
NEUTROS ABS: 2.7 10*3/uL (ref 1.5–6.5)
Platelets: 178 10*3/uL (ref 145–400)
RBC: 4.75 10*6/uL (ref 3.70–5.45)
RDW: 13 % (ref 11.2–14.5)
WBC: 5.2 10*3/uL (ref 3.9–10.3)
lymph#: 1.8 10*3/uL (ref 0.9–3.3)

## 2017-07-19 LAB — COMPREHENSIVE METABOLIC PANEL
ALBUMIN: 4.1 g/dL (ref 3.5–5.0)
ALK PHOS: 61 U/L (ref 40–150)
AST: 17 U/L (ref 5–34)
Anion Gap: 9 mEq/L (ref 3–11)
BILIRUBIN TOTAL: 0.56 mg/dL (ref 0.20–1.20)
BUN: 20.9 mg/dL (ref 7.0–26.0)
CO2: 26 mEq/L (ref 22–29)
Calcium: 10.2 mg/dL (ref 8.4–10.4)
Chloride: 105 mEq/L (ref 98–109)
Creatinine: 0.9 mg/dL (ref 0.6–1.1)
EGFR: 58 mL/min/{1.73_m2} — AB (ref >90)
Glucose: 95 mg/dl (ref 70–140)
Potassium: 4.5 mEq/L (ref 3.5–5.1)
SODIUM: 140 meq/L (ref 136–145)
TOTAL PROTEIN: 7.3 g/dL (ref 6.4–8.3)

## 2017-07-19 MED ORDER — FULVESTRANT 250 MG/5ML IM SOLN
500.0000 mg | Freq: Once | INTRAMUSCULAR | Status: AC
  Administered 2017-07-19: 500 mg via INTRAMUSCULAR
  Filled 2017-07-19: qty 10

## 2017-07-19 NOTE — Telephone Encounter (Signed)
Gave patient avs and calendar with appts per 9/19 los °

## 2017-07-19 NOTE — Patient Instructions (Signed)

## 2017-07-19 NOTE — Progress Notes (Signed)
[facility]  [phone] [phone]     ID: Jessica Glenn DOB: Aug 08, 1929  MR#: [id number]  WGY#:[id number]  Patient Care Team: Maury Dus, MD as PCP - General (Family Medicine) Magrinat, Virgie Dad, MD as Consulting Physician (Oncology) Jovita Kussmaul, MD as Consulting Physician (General Surgery) Scot Dock, NP OTHER MD:  CHIEF COMPLAINT: Locally advanced estrogen receptor positive left breast cancer  CURRENT TREATMENT: Neoadjuvant fulvestrant   HISTORY OF CURRENT ILLNESS: Jessica Glenn tells me she first noted a changes in her left breast about a year and a half ago. She had had some dental work around that time and thought possibly that could be related. She said her breast felt "like steel". She did not bring it to medical attention until her recent appointment with Dr. Alyson Ingles and he set her up for bilateral diagnostic mammography with tomography and left breast ultrasonography at the [facility] 06/19/2017. This found the breast density to be category C. In the upper outer retroareolar left breast there was an irregular mass estimated to be at least 6 cm by mammography. There were heterogeneous calcifications within the mass. There was skin thickening of the areola and an enlarged inferior left axillary lymph node was noted. On exam there was a large fixed hard mass in the upper outer quadrant of the left breast causing protrusion of the areola. It measured approximately 7 cm by palpation and there was visible skin thickening. In the inferior left axilla there was a firm palpable mass measuring 2 cm.  Ultrasound confirmed an irregular hypoechoic vascular mass in the left breast centered on the 1:00 position 4 cm from the nipple measuring 6.7 cm. This was abutting the overlying skin. It was a 2.3 cm hypoechoic vascular mass in the left axilla. The right breast was unremarkable.  On 06/22/2017 the patient had left breast and left axillary node biopsy, showing  ([id number]) both sites to be involved by invasive ductal carcinoma, grade 2, with prognostic panels on both biopsies showing estrogen receptor 100% positivity, progesterone receptor 40-50% positivity, all with strong staining intensity, MIB-1:30 percent in the breast and 15% in the lymph nodes, and both HER-2 negative, with ratios of 0.76-0.85 and number per cell 1.10-1.30.  The patient's subsequent history is as detailed below.  INTERVAL HISTORY: Jessica Glenn is here today after starting Fulvestrant neoadjuvantly for her breast cancer. She is doing well tolerating this medication.  She says that she had minimal pain while receiving the injection and has not noticed any side effects.    REVIEW OF SYSTEMS: Other than what is noted above a detailed ROS was non contributory.      PAST MEDICAL HISTORY: Past Medical History:  Diagnosis Date  . Ankle fracture, right   . Bilateral cataracts    s/p surgery  . Breast CA ([place]) dx'd 05/2017   left  . History of tobacco use   . Hx of skin cancer, basal cell    s/p Moh's surgery   . Painful orthopaedic hardware ([place])    rt ankle    PAST SURGICAL HISTORY: Past Surgical History:  Procedure Laterality Date  . CATARACT EXTRACTION     both eyes  . HARDWARE REMOVAL Right 02/14/2017   Procedure: RIGHT ANKLE HARDWARE REMOVAL;  Surgeon: [name], MD;  Location: [facility];  Service: Orthopedics;  Laterality: Right;  . I&D EXTREMITY Right 02/14/2017   Procedure: RIGHT IRRIGATION AND DEBRIDEMENT ANKLE;  Surgeon: [name], MD;  Location: [facility];  Service: Orthopedics;  Laterality: Right;  . ORIF ANKLE FRACTURE Right 02/02/2016   Procedure: OPEN REDUCTION INTERNAL FIXATION (ORIF) ANKLE FRACTURE;  Surgeon: [name], MD;  Location: [facility];  Service: Orthopedics;  Laterality: Right;  stryker mini c arm reg bed bone foam    FAMILY HISTORY Family History  Problem Relation Age of Onset  .  COPD Mother   The patient's father had a history of alcoholism and a band in the family. The patient has no information on the paternal side. The patient's mother died from emphysema at the age of 37. The patient has one sister, 2 brothers. There is no history of breast or ovarian cancer in the family to her knowledge.  GYNECOLOGIC HISTORY:  No LMP recorded. Patient is postmenopausal. Menarche age 48, first live birth age 67, the patient is [place] P4. She went through the change of life in her 78s. She took hormone replacement for a few years.   SOCIAL HISTORY:  The patient is originally from [place]. Her husband Peteis a traveling Theme park manager, nondenominational. Son peaked lives in [place] where he works as a Theme park manager; son Ronalee Belts lives in [place] where he works as an Optometrist; son Annie Main lives in [place] where he works as a Chief Strategy Officer; son Rodman Key also lives in [place] and works as a Chief Strategy Officer. The patient has 14 grandchildren and 69 great-grandchildren    ADVANCED DIRECTIVES: In place   HEALTH MAINTENANCE: Social History  Substance Use Topics  . Smoking status: Former Research scientist (life sciences)  . Smokeless tobacco: Never Used  . Alcohol use 0.0 oz/week     Comment: social     Colonoscopy:Never  PAP:  Bone density:Osteopenia   No Known Allergies  Current Outpatient Prescriptions  Medication Sig Dispense Refill  . diphenhydramine-acetaminophen (TYLENOL PM) 25-500 MG TABS tablet Take 1 tablet by mouth at bedtime as needed.     No current facility-administered medications for this visit.     OBJECTIVE:  Vitals:   07/19/17 1048  BP: (!) 168/75  Pulse: 70  Resp: 18  Temp: 98.4 F (36.9 C)  SpO2: 97%     Body mass index is 25.37 kg/m.   Wt Readings from Last 3 Encounters:  07/19/17 157 lb 3.2 oz (71.3 kg)  06/29/17 158 lb 1.6 oz (71.7 kg)  02/14/17 155 lb (70.3 kg)      ECOG FS:1 - Symptomatic but completely ambulatory  GENERAL: Patient is a well appearing female in no acute  distress HEENT:  Sclerae anicteric.  Oropharynx clear and moist. No ulcerations or evidence of oropharyngeal candidiasis. Neck is supple.  NODES:  No cervical, supraclavicular, or axillary lymphadenopathy palpated.  BREAST EXAM:  Large mass in upper outer left breast that was noted LUNGS:  Clear to auscultation bilaterally.  No wheezes or rhonchi. HEART:  Regular rate and rhythm. No murmur appreciated. ABDOMEN:  Soft, nontender.  Positive, normoactive bowel sounds. No organomegaly palpated. MSK:  No focal spinal tenderness to palpation. Full range of motion bilaterally in the upper extremities. EXTREMITIES:  No peripheral edema.   SKIN:  Clear with no obvious rashes or skin changes. No nail dyscrasia. NEURO:  Nonfocal. Well oriented.  Appropriate affect.       LAB RESULTS:  CMP     Component Value Date/Time   NA 140 07/19/2017 1000   K 4.5 07/19/2017 1000   CL 108 01/28/2016 0459   CO2 26 07/19/2017 1000   GLUCOSE 95 07/19/2017 1000   BUN 20.9  07/19/2017 1000   CREATININE 0.9 07/19/2017 1000   CALCIUM 10.2 07/19/2017 1000   PROT 7.3 07/19/2017 1000   ALBUMIN 4.1 07/19/2017 1000   AST 17 07/19/2017 1000   ALT <6 07/19/2017 1000   ALKPHOS 61 07/19/2017 1000   BILITOT 0.56 07/19/2017 1000   GFRNONAA 53 (L) 01/28/2016 0459   GFRAA >60 01/28/2016 0459    No results found for: TOTALPROTELP, ALBUMINELP, A1GS, A2GS, BETS, BETA2SER, GAMS, MSPIKE, SPEI  No results found for: KPAFRELGTCHN, LAMBDASER, KAPLAMBRATIO  Lab Results  Component Value Date   WBC 5.2 07/19/2017   NEUTROABS 2.7 07/19/2017   HGB 14.2 07/19/2017   HCT 42.1 07/19/2017   MCV 88.7 07/19/2017   PLT 178 07/19/2017     Lab Results  Component Value Date   CA2729 314.8 (H) 07/05/2017        (this displays the last labs from the last 3 days)  Urinalysis No results found for: COLORURINE, APPEARANCEUR, LABSPEC, PHURINE, GLUCOSEU, HGBUR, BILIRUBINUR, KETONESUR, PROTEINUR, UROBILINOGEN, NITRITE,  LEUKOCYTESUR   STUDIES: Dg Thoracic Spine 2 View  Result Date: 07/19/2017 CLINICAL DATA:  Evaluate lesion seen on bone scan. EXAM: THORACIC SPINE 2 VIEWS COMPARISON:  07/11/17. FINDINGS: Normal alignment of the thoracic spine. No lytic or sclerotic bone lesions identified. There are degenerative scratch set disc space narrowing, endplate sclerosis and ventral spurring noted at T10-11 corresponding to the area of uptake on bone scintigraphy. IMPRESSION: 1. No suspicious bone lesions identified. 2. Area of uptake on recent bone scan within the thoracic spine corresponds to degenerative disc disease at T10-11. Electronically Signed   By: Kerby Moors M.D.   On: 07/19/2017 15:01   Dg Lumbar Spine 2-3 Views  Result Date: 07/19/2017 CLINICAL DATA:  Abnormal uptake on bone scan. EXAM: LUMBAR SPINE - 2-3 VIEW COMPARISON:  Bone scan of July 11, 2017. FINDINGS: No fracture is noted. Minimal grade 1 retrolisthesis of L2-3 is noted secondary to severe degenerative disc disease at this level. Severe degenerative disc disease is also noted at L1-2, L5-S1 and L3-4. Moderate degenerative disc disease is noted at L4-5. Mild levoscoliosis of lower lumbar spine is noted. Degenerative changes seen involving the right-sided posterior facet joint of L5-S1 and L4-5 which corresponds in location to abnormal uptake seen on prior bone scan. IMPRESSION: Degenerative changes are seen involving the right-sided posterior facet joints of L4-5 and L5-S1 which corresponds to area of abnormal uptake seen on prior bone scan. Multilevel degenerative disc disease is noted. No acute abnormality seen in the cervical spine. Electronically Signed   By: Marijo Conception, M.D.   On: 07/19/2017 15:03   Ct Chest W Contrast  Result Date: 07/11/2017 CLINICAL DATA:  New diagnosis of left breast cancer.  Staging. EXAM: CT CHEST WITH CONTRAST TECHNIQUE: Multidetector CT imaging of the chest was performed during intravenous contrast  administration. CONTRAST:  53m ISOVUE-300 IOPAMIDOL (ISOVUE-300) INJECTION 61% COMPARISON:  Metastatic survey same date. FINDINGS: Cardiovascular: There is good vascular opacification. No acute vascular findings are demonstrated. There is mild atherosclerosis of the aorta and coronary arteries. The heart size is normal. There is no pericardial effusion. Mediastinum/Nodes: Dominant left axillary nodal mass demonstrates central necrosis, measuring 19 x 26 mm on image 55. There are no other enlarged axillary, internal mammary, mediastinal or hilar lymph nodes. The thyroid gland, trachea and esophagus demonstrate no significant findings. There is minimal thyroid nodularity. Lungs/Pleura: There is no pleural effusion. There are few scattered small pulmonary nodules bilaterally, all measuring less than 6 mm  in diameter. Many of these are subpleural in location and may reflect intrapulmonary lymph nodes. There are small calcified granulomas in the left lower lobe. No highly suspect nodules identified. Upper abdomen: Slightly irregular subcapsular lesion posteriorly in the right hepatic lobe measures 17 x 11 mm on image 160. This is incompletely characterized by this examination. The visualized liver otherwise appears unremarkable. There is no adrenal mass. Musculoskeletal/Chest wall: There is a large centrally necrotic left breast mass, measuring up to 5.0 x 4.3 cm transverse on image 72. No involvement of the pectoralis musculature are identified. There is no suspicious osseous lesion. Degenerative changes are present throughout the thoracic spine. IMPRESSION: 1. Large left breast mass with solitary enlarged left axillary nodal metastasis. 2. No other findings highly suspicious for thoracic metastatic disease. 3. There are multiple indeterminate small pulmonary nodules bilaterally. Chest CT follow-up in 6 months suggested to evaluate stability. 4. Indeterminate lesion posteriorly in the right hepatic lobe. This does not  appear to reflect a simple cyst, but could be a hemangioma or other benign lesion. Metastasis cannot be excluded. This could be evaluated with abdominal MRI or addressed on follow-up CT. Electronically Signed   By: Richardean Sale M.D.   On: 07/11/2017 15:38   Nm Bone Scan Whole Body  Result Date: 07/11/2017 CLINICAL DATA:  Recent diagnosis of LEFT breast cancer question osseous metastases EXAM: NUCLEAR MEDICINE WHOLE BODY BONE SCAN TECHNIQUE: Whole body anterior and posterior images were obtained approximately 3 hours after intravenous injection of radiopharmaceutical. RADIOPHARMACEUTICALS:  19.9 mCi Technetium-34mMDP IV COMPARISON:  None Radiographic correlation:  CT chest 07/11/2017 FINDINGS: Minimal uptake in the shoulders, RIGHT foot/ ankle and in the hands, typically degenerative. Subtle focus of increased tracer localization at the LEFT lateral aspect of the cervical spine typically degenerative. Single focus of abnormal increased trace localization at the LEFT lateral aspect of the lower thoracic spine at approximately T10-T11, corresponding to a level of significant degenerative disc disease changes on CT. Additional increased uptake at RIGHT lateral aspect of lower lumbar spine at L4-L5, not previously imaged, potentially degenerative. No additional sites of abnormal osseous tracer accumulation are identified to suggest osseous metastatic disease. Mild uptake of tracer within costal cartilaginous calcifications bilaterally, nonspecific. Minimal uptake of tracer in the LEFT breast corresponding to tumor. Otherwise expected soft tissue in urinary tract distribution of tracer. IMPRESSION: Uptake at LEFT T10-T11 likely reflecting degenerative disc disease changes present on CT. Uptake at the RIGHT lateral aspect of the lower lumbar spine at L4-L5, potentially degenerative but without radiographic confirmation; recommend lumbar spine radiographs to assess for presence of degenerative disc disease changes.  No other abnormal foci of worrisome osseous tracer accumulation. Electronically Signed   By: MLavonia DanaM.D.   On: 07/11/2017 17:43   Dg Bone Survey Met  Result Date: 07/11/2017 CLINICAL DATA:  Breast cancer EXAM: METASTATIC BONE SURVEY COMPARISON:  None FINDINGS: Normal heart size, mediastinal contours, and pulmonary vascularity. Atherosclerotic calcification aorta. Lungs appear hyperinflated with minimal central peribronchial thickening. No infiltrate, pleural effusion, pneumothorax, or definite pulmonary mass/nodule. Diffuse osseous demineralization. Nonspecific lytic lesion at the occipital bone, can be seen with lytic bone lesions and with intraosseous extension of arachnoid granulations. Degenerative disc and facet disease changes cervical spine. Degenerative disc disease changes lower thoracic and lumbar spine. Minimal osteitis pubis. No definite lytic or destructive bone lesions. Single tiny sclerotic focus in the LEFT femoral neck, nonspecific. Probable venous varicosities medially in the lower extremities bilaterally. IMPRESSION: Single small nonspecific lytic focus  at the occipital bone of the calvarium, can be seen with lytic lesions and from intraosseous extension of arachnoid granulations. Single tiny nonspecific sclerotic focus at LEFT femoral neck. No other focal osseous abnormalities identified. Degenerative changes of the spine and pubic symphysis as above. Suspected venous varicosities in the lower extremities bilaterally. Electronically Signed   By: Lavonia Dana M.D.   On: 07/11/2017 15:01   [place] Axillary Node Core Biopsy Left  Addendum Date: 06/23/2017   ADDENDUM REPORT: 06/23/2017 14:20 ADDENDUM: Pathology revealed GRADE II INVASIVE DUCTAL CARCINOMA, DUCTAL CARCINOMA IN SITU of the Left breast, upper outer quadrant. METASTATIC CARCINOMA of the Left axillary lymph node. This was found to be concordant by Dr. Kristopher Oppenheim. Pathology results were discussed with the patient by telephone. The  patient reported doing well after the biopsies with tenderness at the sites. Post biopsy instructions and care were reviewed and questions were answered. The patient was encouraged to call [facility] for any additional concerns. Surgical consultation has been arranged with Dr. Autumn Messing at [facility] on June 26, 2017. Pathology results reported by Terie Purser, RN on 06/23/2017. Electronically Signed   By: Kristopher Oppenheim M.D.   On: 06/23/2017 14:20   Result Date: 06/23/2017 CLINICAL DATA:  83 year old female with left breast mass and left axillary lymphadenopathy. EXAM: ULTRASOUND GUIDED CORE NEEDLE BIOPSY OF A LEFT AXILLARY NODE COMPARISON:  Previous exam(s). FINDINGS: I met with the patient and we discussed the procedure of ultrasound-guided biopsy, including benefits and alternatives. We discussed the high likelihood of a successful procedure. We discussed the risks of the procedure, including infection, bleeding, tissue injury, clip migration, and inadequate sampling. Informed written consent was given. The usual time-out protocol was performed immediately prior to the procedure. Using sterile technique and 1% Lidocaine as local anesthetic, under direct ultrasound visualization, a 14 gauge spring-loaded device was used to perform biopsy of a left axillary lymph node using a inferior approach. At the conclusion of the procedure a coil shaped tissue marker clip was deployed into the biopsy cavity. Follow up 2 view mammogram was performed and dictated separately. IMPRESSION: Ultrasound guided biopsy of a left axillary lymph node. No apparent complications. Electronically Signed: By: Kristopher Oppenheim M.D. On: 06/22/2017 16:42   Mm Clip Placement Left  Addendum Date: 06/23/2017   ADDENDUM REPORT: 06/23/2017 14:20 ADDENDUM: Pathology revealed GRADE II INVASIVE DUCTAL CARCINOMA, DUCTAL CARCINOMA IN SITU of the Left breast, upper outer quadrant. METASTATIC CARCINOMA of the  Left axillary lymph node. This was found to be concordant by Dr. Kristopher Oppenheim. Pathology results were discussed with the patient by telephone. The patient reported doing well after the biopsies with tenderness at the sites. Post biopsy instructions and care were reviewed and questions were answered. The patient was encouraged to call [facility] for any additional concerns. Surgical consultation has been arranged with Dr. Autumn Messing at [facility] on June 26, 2017. Pathology results reported by Terie Purser, RN on 06/23/2017. Electronically Signed   By: Kristopher Oppenheim M.D.   On: 06/23/2017 14:20   Result Date: 06/23/2017 CLINICAL DATA:  Clip films status post ultrasound-guided biopsy of a left breast mass and left axillary lymph node. EXAM: DIAGNOSTIC LEFT MAMMOGRAM POST ULTRASOUND BIOPSY COMPARISON:  Previous exam(s). FINDINGS: Mammographic images were obtained following ultrasound guided biopsy of a left breast mass and left axillary lymph node. A ribbon shaped clip is identified with in the left breast mass. A coil  shaped clip is identified within the axillary lymph node. IMPRESSION: Ribbon shaped left breast post biopsy clip and coil shaped left axillary post biopsy clip in expected location status post ultrasound-guided biopsy. Final Assessment: Post Procedure Mammograms for Marker Placement Electronically Signed: By: Kristopher Oppenheim M.D. On: 06/22/2017 16:43   [place] Lt Breast Bx W Loc Dev 1st Lesion Img Bx Spec US Guide  Result Date: 06/22/2017 CLINICAL DATA:  83 year old female with left breast mass and left axillary lymphadenopathy. EXAM: ULTRASOUND GUIDED LEFT BREAST CORE NEEDLE BIOPSY COMPARISON:  Previous exam(s). FINDINGS: I met with the patient and we discussed the procedure of ultrasound-guided biopsy, including benefits and alternatives. We discussed the high likelihood of a successful procedure. We discussed the risks of the procedure, including infection,  bleeding, tissue injury, clip migration, and inadequate sampling. Informed written consent was given. The usual time-out protocol was performed immediately prior to the procedure. Lesion quadrant: Upper-outer quadrant. Using sterile technique and 1% Lidocaine as local anesthetic, under direct ultrasound visualization, a 12 gauge spring-loaded device was used to perform biopsy of left breast mass using a inferior approach. At the conclusion of the procedure a ribbon shaped tissue marker clip was deployed into the biopsy cavity. Follow up 2 view mammogram was performed and dictated separately. IMPRESSION: Ultrasound guided biopsy of a left breast mass. No apparent complications. Electronically Signed   By: Kristopher Oppenheim M.D.   On: 06/22/2017 16:41    ELIGIBLE FOR AVAILABLE RESEARCH PROTOCOL: no ASSESSMENT: 83 y.o. [place] woman status post left breast overlapping sites biopsy 06/22/2017 for a clinical T3 N1-2, stage II-III invasive ductal carcinoma, grade 2, estrogen and progesterone receptor positive, HER-2 nonamplified, with an MIB-1 of 30%  (1) neo-adjuvant fulvestrant started on 07/05/2017  (2) definitive surgery to follow  (3) adjuvant radiation to follow surgery  (4) continue on anti-estrogens a minimum of 5 years  PLAN: Jessica Glenn is doing very well with the Fulvestrant.  She is tolerating it without any concern whatsoever.  Her lab work is stable and I reviewed this with her today in detail.  She will proceed with this injection today.  She will return in 2 weeks for her final load of Fulvestrant.  At that time she will then go to every four weeks Fulvestrant.    Jessica Glenn will return in 2 weeks for labs/fulvestrant and in 6 weeks for labs, f/u with Dr. Jana Hakim, and Fulvestrant.     Jessica Glenn has a good understanding of the overall plan. She agrees with it. She knows the goal of treatment in her case is cure. She will call with any problems that may develop before her next visit here.  A total  of (20) minutes of face-to-face time was spent with this patient with greater than 50% of that time in counseling and care-coordination.    Scot Dock, NP   07/19/2017 4:00 PM Medical Oncology and Hematology [facility] [address] [place], [place] [address] Tel. [phone]    Fax. [phone]

## 2017-08-02 ENCOUNTER — Ambulatory Visit (HOSPITAL_BASED_OUTPATIENT_CLINIC_OR_DEPARTMENT_OTHER): Payer: Medicare Other

## 2017-08-02 ENCOUNTER — Other Ambulatory Visit (HOSPITAL_BASED_OUTPATIENT_CLINIC_OR_DEPARTMENT_OTHER): Payer: Medicare Other

## 2017-08-02 DIAGNOSIS — Z5111 Encounter for antineoplastic chemotherapy: Secondary | ICD-10-CM | POA: Diagnosis not present

## 2017-08-02 DIAGNOSIS — C50412 Malignant neoplasm of upper-outer quadrant of left female breast: Secondary | ICD-10-CM

## 2017-08-02 DIAGNOSIS — C50812 Malignant neoplasm of overlapping sites of left female breast: Secondary | ICD-10-CM

## 2017-08-02 DIAGNOSIS — C50912 Malignant neoplasm of unspecified site of left female breast: Secondary | ICD-10-CM

## 2017-08-02 DIAGNOSIS — Z170 Estrogen receptor positive status [ER+]: Principal | ICD-10-CM

## 2017-08-02 LAB — CBC WITH DIFFERENTIAL/PLATELET
BASO%: 0.6 % (ref 0.0–2.0)
Basophils Absolute: 0 10*3/uL (ref 0.0–0.1)
EOS%: 4.7 % (ref 0.0–7.0)
Eosinophils Absolute: 0.3 10*3/uL (ref 0.0–0.5)
HCT: 42.6 % (ref 34.8–46.6)
HEMOGLOBIN: 14.1 g/dL (ref 11.6–15.9)
LYMPH%: 23.6 % (ref 14.0–49.7)
MCH: 29.8 pg (ref 25.1–34.0)
MCHC: 33.1 g/dL (ref 31.5–36.0)
MCV: 90 fL (ref 79.5–101.0)
MONO#: 0.6 10*3/uL (ref 0.1–0.9)
MONO%: 10.1 % (ref 0.0–14.0)
NEUT%: 61 % (ref 38.4–76.8)
NEUTROS ABS: 3.8 10*3/uL (ref 1.5–6.5)
Platelets: 182 10*3/uL (ref 145–400)
RBC: 4.73 10*6/uL (ref 3.70–5.45)
RDW: 13.3 % (ref 11.2–14.5)
WBC: 6.3 10*3/uL (ref 3.9–10.3)
lymph#: 1.5 10*3/uL (ref 0.9–3.3)

## 2017-08-02 LAB — COMPREHENSIVE METABOLIC PANEL
ALBUMIN: 4.2 g/dL (ref 3.5–5.0)
ALK PHOS: 63 U/L (ref 40–150)
ALT: 9 U/L (ref 0–55)
AST: 21 U/L (ref 5–34)
Anion Gap: 9 mEq/L (ref 3–11)
BILIRUBIN TOTAL: 0.49 mg/dL (ref 0.20–1.20)
BUN: 23.4 mg/dL (ref 7.0–26.0)
CO2: 28 meq/L (ref 22–29)
Calcium: 10.1 mg/dL (ref 8.4–10.4)
Chloride: 105 mEq/L (ref 98–109)
Creatinine: 1 mg/dL (ref 0.6–1.1)
EGFR: 54 mL/min/{1.73_m2} — AB (ref >90)
GLUCOSE: 87 mg/dL (ref 70–140)
POTASSIUM: 5 meq/L (ref 3.5–5.1)
SODIUM: 143 meq/L (ref 136–145)
TOTAL PROTEIN: 7.2 g/dL (ref 6.4–8.3)

## 2017-08-02 MED ORDER — FULVESTRANT 250 MG/5ML IM SOLN
500.0000 mg | Freq: Once | INTRAMUSCULAR | Status: AC
  Administered 2017-08-02: 500 mg via INTRAMUSCULAR
  Filled 2017-08-02: qty 10

## 2017-08-02 NOTE — Patient Instructions (Signed)

## 2017-08-03 LAB — CANCER ANTIGEN 27.29: CAN 27.29: 320.3 U/mL — AB (ref 0.0–38.6)

## 2017-08-14 ENCOUNTER — Other Ambulatory Visit: Payer: Self-pay | Admitting: *Deleted

## 2017-08-14 ENCOUNTER — Telehealth: Payer: Self-pay | Admitting: *Deleted

## 2017-08-14 DIAGNOSIS — C50812 Malignant neoplasm of overlapping sites of left female breast: Secondary | ICD-10-CM

## 2017-08-14 NOTE — Telephone Encounter (Signed)
Pt called with c/o left breast having "a fever or heat". Scheduled and confirmed appt with Mendel Ryder, NP on 08/15/17 at 0800. [place]

## 2017-08-15 ENCOUNTER — Other Ambulatory Visit (HOSPITAL_BASED_OUTPATIENT_CLINIC_OR_DEPARTMENT_OTHER): Payer: Medicare Other

## 2017-08-15 ENCOUNTER — Encounter: Payer: Self-pay | Admitting: Adult Health

## 2017-08-15 ENCOUNTER — Ambulatory Visit (HOSPITAL_BASED_OUTPATIENT_CLINIC_OR_DEPARTMENT_OTHER): Payer: Medicare Other | Admitting: Adult Health

## 2017-08-15 ENCOUNTER — Telehealth: Payer: Self-pay | Admitting: Oncology

## 2017-08-15 VITALS — BP 153/59 | HR 75 | Temp 98.6°F | Resp 20 | Ht 66.0 in | Wt 155.7 lb

## 2017-08-15 DIAGNOSIS — Z170 Estrogen receptor positive status [ER+]: Principal | ICD-10-CM

## 2017-08-15 DIAGNOSIS — C50812 Malignant neoplasm of overlapping sites of left female breast: Secondary | ICD-10-CM | POA: Diagnosis not present

## 2017-08-15 DIAGNOSIS — C50912 Malignant neoplasm of unspecified site of left female breast: Secondary | ICD-10-CM

## 2017-08-15 DIAGNOSIS — N649 Disorder of breast, unspecified: Secondary | ICD-10-CM | POA: Diagnosis not present

## 2017-08-15 LAB — CBC WITH DIFFERENTIAL/PLATELET
BASO%: 0.7 % (ref 0.0–2.0)
BASOS ABS: 0 10*3/uL (ref 0.0–0.1)
EOS ABS: 0.3 10*3/uL (ref 0.0–0.5)
EOS%: 6.9 % (ref 0.0–7.0)
HCT: 40.1 % (ref 34.8–46.6)
HGB: 13.4 g/dL (ref 11.6–15.9)
LYMPH%: 31 % (ref 14.0–49.7)
MCH: 29.9 pg (ref 25.1–34.0)
MCHC: 33.5 g/dL (ref 31.5–36.0)
MCV: 89.3 fL (ref 79.5–101.0)
MONO#: 0.4 10*3/uL (ref 0.1–0.9)
MONO%: 8.5 % (ref 0.0–14.0)
NEUT%: 52.9 % (ref 38.4–76.8)
NEUTROS ABS: 2.6 10*3/uL (ref 1.5–6.5)
PLATELETS: 173 10*3/uL (ref 145–400)
RBC: 4.49 10*6/uL (ref 3.70–5.45)
RDW: 13.3 % (ref 11.2–14.5)
WBC: 4.8 10*3/uL (ref 3.9–10.3)
lymph#: 1.5 10*3/uL (ref 0.9–3.3)

## 2017-08-15 LAB — COMPREHENSIVE METABOLIC PANEL
ALT: 9 U/L (ref 0–55)
AST: 20 U/L (ref 5–34)
Albumin: 4.1 g/dL (ref 3.5–5.0)
Alkaline Phosphatase: 58 U/L (ref 40–150)
Anion Gap: 11 mEq/L (ref 3–11)
BUN: 18.6 mg/dL (ref 7.0–26.0)
CO2: 24 mEq/L (ref 22–29)
Calcium: 9.5 mg/dL (ref 8.4–10.4)
Chloride: 107 mEq/L (ref 98–109)
Creatinine: 0.9 mg/dL (ref 0.6–1.1)
EGFR: >60 mL/min/{1.73_m2} (ref >60)
Glucose: 97 mg/dl (ref 70–140)
Potassium: 4.1 mEq/L (ref 3.5–5.1)
Sodium: 143 mEq/L (ref 136–145)
Total Bilirubin: 0.39 mg/dL (ref 0.20–1.20)
Total Protein: 7.1 g/dL (ref 6.4–8.3)

## 2017-08-15 NOTE — Telephone Encounter (Signed)
No 10/16 los.  

## 2017-08-15 NOTE — Progress Notes (Signed)
[facility]  Telephone:(336) [phone] Fax:(336) [phone]     ID: Jessica Glenn DOB: 04/19/1929  MR#: [id number]  BPZ#:[id number]  Patient Care Team: Maury Dus, MD as PCP - General (Family Medicine) Magrinat, Virgie Dad, MD as Consulting Physician (Oncology) Jovita Kussmaul, MD as Consulting Physician (General Surgery) Scot Dock, NP OTHER MD:  CHIEF COMPLAINT: Locally advanced estrogen receptor positive left breast cancer  CURRENT TREATMENT: Neoadjuvant fulvestrant   HISTORY OF CURRENT ILLNESS: [name] tells me she first noted a changes in her left breast about a year and a half ago. She had had some dental work around that time and thought possibly that could be related. She said her breast felt "like steel". She did not bring it to medical attention until her recent appointment with Dr. Alyson Ingles and he set her up for bilateral diagnostic mammography with tomography and left breast ultrasonography at the [facility] 06/19/2017. This found the breast density to be category C. In the upper outer retroareolar left breast there was an irregular mass estimated to be at least 6 cm by mammography. There were heterogeneous calcifications within the mass. There was skin thickening of the areola and an enlarged inferior left axillary lymph node was noted. On exam there was a large fixed hard mass in the upper outer quadrant of the left breast causing protrusion of the areola. It measured approximately 7 cm by palpation and there was visible skin thickening. In the inferior left axilla there was a firm palpable mass measuring 2 cm.  Ultrasound confirmed an irregular hypoechoic vascular mass in the left breast centered on the 1:00 position 4 cm from the nipple measuring 6.7 cm. This was abutting the overlying skin. It was a 2.3 cm hypoechoic vascular mass in the left axilla. The right breast was unremarkable.  On 06/22/2017 the patient had left breast and left axillary node biopsy, showing  ([id number]) both sites to be involved by invasive ductal carcinoma, grade 2, with prognostic panels on both biopsies showing estrogen receptor 100% positivity, progesterone receptor 40-50% positivity, all with strong staining intensity, MIB-1:30 percent in the breast and 15% in the lymph nodes, and both HER-2 negative, with ratios of 0.76-0.85 and number per cell 1.10-1.30.  The patient's subsequent history is as detailed below.  INTERVAL HISTORY: Jessica Glenn is here today with warmth in her left breast where the cancer is.  She says she first noticed this on Friday, 08/11/2017.  She denies any pain, erythema, drainage from the nipple.  She says that the mass is still present, and she isn't sure its shrinking at all.    REVIEW OF SYSTEMS: Other than what is noted above a detailed ROS was non contributory.      PAST MEDICAL HISTORY: Past Medical History:  Diagnosis Date  . Ankle fracture, right   . Bilateral cataracts    s/p surgery  . Breast CA ([facility]) dx'd 05/2017   left  . History of tobacco use   . Hx of skin cancer, basal cell    s/p Moh's surgery   . Painful orthopaedic hardware ([place])    rt ankle    PAST SURGICAL HISTORY: Past Surgical History:  Procedure Laterality Date  . CATARACT EXTRACTION     both eyes  . HARDWARE REMOVAL Right 02/14/2017   Procedure: RIGHT ANKLE HARDWARE REMOVAL;  Surgeon: [name], MD;  Location: [facility];  Service: Orthopedics;  Laterality: Right;  . I&D EXTREMITY Right 02/14/2017   Procedure: RIGHT  IRRIGATION AND DEBRIDEMENT ANKLE;  Surgeon: [name], MD;  Location: [facility];  Service: Orthopedics;  Laterality: Right;  . ORIF ANKLE FRACTURE Right 02/02/2016   Procedure: OPEN REDUCTION INTERNAL FIXATION (ORIF) ANKLE FRACTURE;  Surgeon: [name], MD;  Location: [facility];  Service: Orthopedics;  Laterality: Right;  stryker mini c arm reg bed bone foam    FAMILY HISTORY Family History    Problem Relation Age of Onset  . COPD Mother   The patient's father had a history of alcoholism and a band in the family. The patient has no information on the paternal side. The patient's mother died from emphysema at the age of 65. The patient has one sister, 2 brothers. There is no history of breast or ovarian cancer in the family to her knowledge.  GYNECOLOGIC HISTORY:  No LMP recorded. Patient is postmenopausal. Menarche age 13, first live birth age 59, the patient is [place] P4. She went through the change of life in her 79s. She took hormone replacement for a few years.   SOCIAL HISTORY:  The patient is originally from [place]. Her husband Peteis a traveling Theme park manager, nondenominational. Son peaked lives in [place] where he works as a Theme park manager; son Ronalee Belts lives in [place] where he works as an Optometrist; son Annie Main lives in [place] where he works as a Chief Strategy Officer; son Rodman Key also lives in [place] and works as a Chief Strategy Officer. The patient has 14 grandchildren and 83 great-grandchildren    ADVANCED DIRECTIVES: In place   HEALTH MAINTENANCE: Social History  Substance Use Topics  . Smoking status: Former Research scientist (life sciences)  . Smokeless tobacco: Never Used  . Alcohol use 0.0 oz/week     Comment: social     Colonoscopy:Never  PAP:  Bone density:Osteopenia   No Known Allergies  Current Outpatient Prescriptions  Medication Sig Dispense Refill  . diphenhydramine-acetaminophen (TYLENOL PM) 25-500 MG TABS tablet Take 1 tablet by mouth at bedtime as needed.     No current facility-administered medications for this visit.     OBJECTIVE:  There were no vitals filed for this visit.   There is no height or weight on file to calculate BMI.   Wt Readings from Last 3 Encounters:  07/19/17 157 lb 3.2 oz (71.3 kg)  06/29/17 158 lb 1.6 oz (71.7 kg)  02/14/17 155 lb (70.3 kg)      ECOG FS:1 - Symptomatic but completely ambulatory  GENERAL: Patient is a well appearing female in no acute  distress HEENT:  Sclerae anicteric.  Oropharynx clear and moist. No ulcerations or evidence of oropharyngeal candidiasis. Neck is supple.  NODES:  No cervical, supraclavicular, or right axillary lymphadenopathy palpated. + left axillary adenopathy BREAST EXAM:  Large mass in upper outer left breast that was noted 7 x 7 cm, no distinct warmth or erythema noted LUNGS:  Clear to auscultation bilaterally.  No wheezes or rhonchi. HEART:  Regular rate and rhythm. No murmur appreciated. ABDOMEN:  Soft, nontender.  Positive, normoactive bowel sounds. No organomegaly palpated. MSK:  No focal spinal tenderness to palpation. Full range of motion bilaterally in the upper extremities. EXTREMITIES:  No peripheral edema.   SKIN:  Clear with no obvious rashes or skin changes. No nail dyscrasia. NEURO:  Nonfocal. Well oriented.  Appropriate affect.       LAB RESULTS:  CMP     Component Value Date/Time   NA 143 08/02/2017 1058   K 5.0 08/02/2017 1058   CL 108 01/28/2016 0459  CO2 28 08/02/2017 1058   GLUCOSE 87 08/02/2017 1058   BUN 23.4 08/02/2017 1058   CREATININE 1.0 08/02/2017 1058   CALCIUM 10.1 08/02/2017 1058   PROT 7.2 08/02/2017 1058   ALBUMIN 4.2 08/02/2017 1058   AST 21 08/02/2017 1058   ALT 9 08/02/2017 1058   ALKPHOS 63 08/02/2017 1058   BILITOT 0.49 08/02/2017 1058   GFRNONAA 53 (L) 01/28/2016 0459   GFRAA >60 01/28/2016 0459    No results found for: TOTALPROTELP, ALBUMINELP, A1GS, A2GS, BETS, BETA2SER, GAMS, MSPIKE, SPEI  No results found for: Nils Pyle, [facility]  Lab Results  Component Value Date   WBC 6.3 08/02/2017   NEUTROABS 3.8 08/02/2017   HGB 14.1 08/02/2017   HCT 42.6 08/02/2017   MCV 90.0 08/02/2017   PLT 182 08/02/2017     Lab Results  Component Value Date   [id number] 320.3 (H) 08/02/2017        (this displays the last labs from the last 3 days)  Urinalysis No results found for: COLORURINE, APPEARANCEUR, LABSPEC, PHURINE,  GLUCOSEU, HGBUR, BILIRUBINUR, KETONESUR, PROTEINUR, UROBILINOGEN, NITRITE, LEUKOCYTESUR   STUDIES: Dg Thoracic Spine 2 View  Result Date: 07/19/2017 CLINICAL DATA:  Evaluate lesion seen on bone scan. EXAM: THORACIC SPINE 2 VIEWS COMPARISON:  07/11/17. FINDINGS: Normal alignment of the thoracic spine. No lytic or sclerotic bone lesions identified. There are degenerative scratch set disc space narrowing, endplate sclerosis and ventral spurring noted at T10-11 corresponding to the area of uptake on bone scintigraphy. IMPRESSION: 1. No suspicious bone lesions identified. 2. Area of uptake on recent bone scan within the thoracic spine corresponds to degenerative disc disease at T10-11. Electronically Signed   By: Kerby Moors M.D.   On: 07/19/2017 15:01   Dg Lumbar Spine 2-3 Views  Result Date: 07/19/2017 CLINICAL DATA:  Abnormal uptake on bone scan. EXAM: LUMBAR SPINE - 2-3 VIEW COMPARISON:  Bone scan of July 11, 2017. FINDINGS: No fracture is noted. Minimal grade 1 retrolisthesis of L2-3 is noted secondary to severe degenerative disc disease at this level. Severe degenerative disc disease is also noted at L1-2, L5-S1 and L3-4. Moderate degenerative disc disease is noted at L4-5. Mild levoscoliosis of lower lumbar spine is noted. Degenerative changes seen involving the right-sided posterior facet joint of L5-S1 and L4-5 which corresponds in location to abnormal uptake seen on prior bone scan. IMPRESSION: Degenerative changes are seen involving the right-sided posterior facet joints of L4-5 and L5-S1 which corresponds to area of abnormal uptake seen on prior bone scan. Multilevel degenerative disc disease is noted. No acute abnormality seen in the cervical spine. Electronically Signed   By: Marijo Conception, M.D.   On: 07/19/2017 15:03    ELIGIBLE FOR AVAILABLE RESEARCH PROTOCOL: no ASSESSMENT: 83 y.o. [place] woman status post left breast overlapping sites biopsy 06/22/2017 for a clinical T3  N1-2, stage II-III invasive ductal carcinoma, grade 2, estrogen and progesterone receptor positive, HER-2 nonamplified, with an MIB-1 of 30%  (1) neo-adjuvant fulvestrant started on 07/05/2017  (2) definitive surgery to follow  (3) adjuvant radiation to follow surgery  (4) continue on anti-estrogens a minimum of 5 years  PLAN: Tateanna continues to tolerate Fulvestrant well.  I note no changes or concern for infection in her left breast.  She continues to have a large mass that I measured at today's visit.  There are no signs of infection.  Her WBC is normal.  I reviewed this with her.  She is relieved with this news.  Elysse will return on 10/31 for  labs, f/u with Dr. Jana Hakim, and Fulvestrant.     Jakelyn has a good understanding of the overall plan. She agrees with it. She knows the goal of treatment in her case is cure. She will call with any problems that may develop before her next visit here.  A total of (20) minutes of face-to-face time was spent with this patient with greater than 50% of that time in counseling and care-coordination.    Scot Dock, NP   08/15/2017 8:09 AM Medical Oncology and Hematology [facility] [address] [place], [place] [address] Tel. [phone]    Fax. [phone]

## 2017-08-27 NOTE — Progress Notes (Signed)
[facility]  Telephone:(336) [phone] Fax:(336) [phone]     ID: DARION JUHASZ DOB: Aug 06, 1929  MR#: [id number]  ION#:[id number]  Patient Care Team: Maury Dus, MD as PCP - General (Family Medicine) Tel Hevia, Virgie Dad, MD as Consulting Physician (Oncology) Jovita Kussmaul, MD as Consulting Physician (General Surgery) OTHER MD:  CHIEF COMPLAINT: Locally advanced estrogen receptor positive left breast cancer  CURRENT TREATMENT: Neoadjuvant fulvestrant   HISTORY OF CURRENT ILLNESS: From the original intake note:  Jessica Glenn tells me she first noted a changes in her left breast about a year and a half ago. She had had some dental work around that time and thought possibly that could be related. She said her breast felt "like steel". She did not bring it to medical attention until her recent appointment with Dr. Alyson Ingles and he set her up for bilateral diagnostic mammography with tomography and left breast ultrasonography at the [facility] 06/19/2017. This found the breast density to be category C. In the upper outer retroareolar left breast there was an irregular mass estimated to be at least 6 cm by mammography. There were heterogeneous calcifications within the mass. There was skin thickening of the areola and an enlarged inferior left axillary lymph node was noted. On exam there was a large fixed hard mass in the upper outer quadrant of the left breast causing protrusion of the areola. It measured approximately 7 cm by palpation and there was visible skin thickening. In the inferior left axilla there was a firm palpable mass measuring 2 cm.  Ultrasound confirmed an irregular hypoechoic vascular mass in the left breast centered on the 1:00 position 4 cm from the nipple measuring 6.7 cm. This was abutting the overlying skin. It was a 2.3 cm hypoechoic vascular mass in the left axilla. The right breast was unremarkable.  On 06/22/2017 the patient had left breast and left axillary node  biopsy, showing ([id number]) both sites to be involved by invasive ductal carcinoma, grade 2, with prognostic panels on both biopsies showing estrogen receptor 100% positivity, progesterone receptor 40-50% positivity, all with strong staining intensity, MIB-1:30 percent in the breast and 15% in the lymph nodes, and both HER-2 negative, with ratios of 0.76-0.85 and number per cell 1.10-1.30.  The patient's subsequent history is as detailed below.  INTERVAL HISTORY: Keysha returns today for follow-up of her locally advanced estrogen receptor positive breast cancer.  She continues on fulvestrant.  She has no pain with the fulvestrant injections with administration or after.  She has had no side effects from the treatment at all.  She thinks there may be a slight change in the breast, perhaps a little softer, but has not noted a significant change to date.  REVIEW OF SYSTEMS: Eusebia reports that she completes housework, gardens, and walks approximately 40 minutes for 5 days a week. She doesn't belong to a gym. She continues to have transient pain to her left breast and notes that the mass is softer at this time. She denies unusual headaches, visual changes, nausea, vomiting, or dizziness. There has been no unusual cough, phlegm production, or pleurisy. This been no change in bowel or bladder habits. She denies unexplained fatigue or unexplained weight loss, bleeding, rash, or fever. A detailed review of systems was otherwise stable.     PAST MEDICAL HISTORY: Past Medical History:  Diagnosis Date  . Ankle fracture, right   . Bilateral cataracts    s/p surgery  . Breast CA ([facility]) dx'd 05/2017   left  .  History of tobacco use   . Hx of skin cancer, basal cell    s/p Moh's surgery   . Painful orthopaedic hardware ([facility])    rt ankle    PAST SURGICAL HISTORY: Past Surgical History:  Procedure Laterality Date  . CATARACT EXTRACTION     both eyes  . HARDWARE REMOVAL Right 02/14/2017   Procedure: RIGHT  ANKLE HARDWARE REMOVAL;  Surgeon: Sheral Apley, MD;  Location: [facility];  Service: Orthopedics;  Laterality: Right;  . I&D EXTREMITY Right 02/14/2017   Procedure: RIGHT IRRIGATION AND DEBRIDEMENT ANKLE;  Surgeon: Sheral Apley, MD;  Location: [facility];  Service: Orthopedics;  Laterality: Right;  . ORIF ANKLE FRACTURE Right 02/02/2016   Procedure: OPEN REDUCTION INTERNAL FIXATION (ORIF) ANKLE FRACTURE;  Surgeon: Sheral Apley, MD;  Location: [facility];  Service: Orthopedics;  Laterality: Right;  stryker mini c arm reg bed bone foam    FAMILY HISTORY Family History  Problem Relation Age of Onset  . COPD Mother   The patient's father had a history of alcoholism and a band in the family. The patient has no information on the paternal side. The patient's mother died from emphysema at the age of 56. The patient has one sister, 2 brothers. There is no history of breast or ovarian cancer in the family to her knowledge.  GYNECOLOGIC HISTORY:  No LMP recorded. Patient is postmenopausal. Menarche age 43, first live birth age 41, the patient is [place] P4. She went through the change of life in her 27s. She took hormone replacement for a few years.   SOCIAL HISTORY:  The patient is originally from [place]. Her husband Peteis a traveling Education officer, environmental, nondenominational. Son peaked lives in [place] where he works as a Education officer, environmental; son Kathlene November lives in [place] where he works as an Airline pilot; son Jeannett Senior lives in [place] where he works as a Surveyor, minerals; son Molli Hazard also lives in [place] and works as a Surveyor, minerals. The patient has 14 grandchildren and 17 great-grandchildren    ADVANCED DIRECTIVES: In place   HEALTH MAINTENANCE: Social History  Substance Use Topics  . Smoking status: Former Games developer  . Smokeless tobacco: Never Used  . Alcohol use 0.0 oz/week     Comment: social     Colonoscopy:Never  PAP:  Bone density:Osteopenia   No Known  Allergies  Current Outpatient Prescriptions  Medication Sig Dispense Refill  . diphenhydramine-acetaminophen (TYLENOL PM) 25-500 MG TABS tablet Take 1 tablet by mouth at bedtime as needed.     No current facility-administered medications for this visit.     OBJECTIVE: Older white woman in no acute distress  Vitals:   08/30/17 1120  BP: (!) 117/97  Pulse: 71  Resp: 17  Temp: 97.8 F (36.6 C)  SpO2: 98%     Body mass index is 25.29 kg/m.   Wt Readings from Last 3 Encounters:  08/30/17 156 lb 11.2 oz (71.1 kg)  08/15/17 155 lb 11.2 oz (70.6 kg)  07/19/17 157 lb 3.2 oz (71.3 kg)      ECOG FS:1 - Symptomatic but completely ambulatory  Sclerae unicteric, pupils round and equal Oropharynx clear and moist No cervical or supraclavicular adenopathy Lungs no rales or rhonchi Heart regular rate and rhythm Abd soft, nontender, positive bowel sounds MSK no focal spinal tenderness, no upper extremity lymphedema Neuro: nonfocal, well oriented, appropriate affect Breasts: The right breast is unremarkable.  The left breast has a significant mass still present, perhaps 4-5 cm,  easily movable, with some bulging around the areolar area as previously noted.  The mass seems a little bit more movable and perhaps a little softer.  There is no skin erythema.  I do not palpate any axillary adenopathy.  LAB RESULTS:  CMP     Component Value Date/Time   NA 141 08/30/2017 1031   K 4.3 08/30/2017 1031   CL 108 01/28/2016 0459   CO2 26 08/30/2017 1031   GLUCOSE 99 08/30/2017 1031   BUN 16.7 08/30/2017 1031   CREATININE 0.9 08/30/2017 1031   CALCIUM 9.7 08/30/2017 1031   PROT 7.2 08/30/2017 1031   ALBUMIN 4.1 08/30/2017 1031   AST 20 08/30/2017 1031   ALT 8 08/30/2017 1031   ALKPHOS 56 08/30/2017 1031   BILITOT 0.51 08/30/2017 1031   GFRNONAA 53 (L) 01/28/2016 0459   GFRAA >60 01/28/2016 0459    No results found for: TOTALPROTELP, ALBUMINELP, A1GS, A2GS, BETS, BETA2SER, GAMS, MSPIKE,  SPEI  No results found for: Nils Pyle, [facility]  Lab Results  Component Value Date   WBC 5.3 08/30/2017   NEUTROABS 3.1 08/30/2017   HGB 13.4 08/30/2017   HCT 41.4 08/30/2017   MCV 91.0 08/30/2017   PLT 160 08/30/2017     Lab Results  Component Value Date   [id number] 320.3 (H) 08/02/2017       Urinalysis No results found for: COLORURINE, APPEARANCEUR, LABSPEC, PHURINE, GLUCOSEU, HGBUR, BILIRUBINUR, KETONESUR, PROTEINUR, UROBILINOGEN, NITRITE, LEUKOCYTESUR   STUDIES: Prior studies reviewed with the patient  ELIGIBLE FOR AVAILABLE RESEARCH PROTOCOL: no ASSESSMENT: 83 y.o. [place] woman status post left breast overlapping sites biopsy 06/22/2017 for a clinical T3 N1-2, stage II-III invasive ductal carcinoma, grade 2, estrogen and progesterone receptor positive, HER-2 nonamplified, with an MIB-1 of 30%  (a) staging CT of the chest and bone survey and bone scan July 11, 2017 showed multiple indeterminant subcentimeter pulmonary nodules which will require follow-up, as well as an indeterminate right hepatic lobe lesion, but no definite evidence of malignancy.  (b) CA-27-29 is informative  (1) neo-adjuvant fulvestrant started on 07/05/2017  (2) definitive surgery to follow  (3) adjuvant radiation to follow surgery  (4) continue on anti-estrogens a minimum of 5 years  PLAN: Trenesha is tolerating the fulvestrant well.  I do not think there has been any disease progression but I cannot document response either.  We are going to proceed with treatment today late November and then at the end of December.  I will see her with the December treatment on a week before that, just before Christmas, she will have a repeat ultrasound of the left breast to assess for response.  She knows to call for any other issues that may develop before that visit.  Alakai Macbride, Virgie Dad, MD  08/30/17 11:29 AM Medical Oncology and Hematology [facility] [address] [place], [place] [address] Tel. [phone]    Fax. [phone]  This document serves as a record of services personally performed by Lurline Del, MD. It was created on her behalf by Steva Colder, a trained medical scribe. The creation of this record is based on the scribe's personal observations and the provider's statements to them. This document has been checked and approved by the attending provider.

## 2017-08-30 ENCOUNTER — Other Ambulatory Visit (HOSPITAL_BASED_OUTPATIENT_CLINIC_OR_DEPARTMENT_OTHER): Payer: Medicare Other

## 2017-08-30 ENCOUNTER — Ambulatory Visit (HOSPITAL_BASED_OUTPATIENT_CLINIC_OR_DEPARTMENT_OTHER): Payer: Medicare Other

## 2017-08-30 ENCOUNTER — Ambulatory Visit (HOSPITAL_BASED_OUTPATIENT_CLINIC_OR_DEPARTMENT_OTHER): Payer: Medicare Other | Admitting: Oncology

## 2017-08-30 ENCOUNTER — Telehealth: Payer: Self-pay | Admitting: Oncology

## 2017-08-30 VITALS — BP 117/97 | HR 71 | Temp 97.8°F | Resp 17 | Ht 66.0 in | Wt 156.7 lb

## 2017-08-30 DIAGNOSIS — C50812 Malignant neoplasm of overlapping sites of left female breast: Secondary | ICD-10-CM

## 2017-08-30 DIAGNOSIS — Z170 Estrogen receptor positive status [ER+]: Principal | ICD-10-CM

## 2017-08-30 DIAGNOSIS — C773 Secondary and unspecified malignant neoplasm of axilla and upper limb lymph nodes: Secondary | ICD-10-CM

## 2017-08-30 DIAGNOSIS — Z5111 Encounter for antineoplastic chemotherapy: Secondary | ICD-10-CM

## 2017-08-30 DIAGNOSIS — C50912 Malignant neoplasm of unspecified site of left female breast: Secondary | ICD-10-CM

## 2017-08-30 DIAGNOSIS — C50919 Malignant neoplasm of unspecified site of unspecified female breast: Secondary | ICD-10-CM

## 2017-08-30 LAB — CBC WITH DIFFERENTIAL/PLATELET
BASO%: 0.4 % (ref 0.0–2.0)
BASOS ABS: 0 10*3/uL (ref 0.0–0.1)
EOS%: 3.6 % (ref 0.0–7.0)
Eosinophils Absolute: 0.2 10*3/uL (ref 0.0–0.5)
HEMATOCRIT: 41.4 % (ref 34.8–46.6)
HEMOGLOBIN: 13.4 g/dL (ref 11.6–15.9)
LYMPH#: 1.5 10*3/uL (ref 0.9–3.3)
LYMPH%: 29.1 % (ref 14.0–49.7)
MCH: 29.5 pg (ref 25.1–34.0)
MCHC: 32.4 g/dL (ref 31.5–36.0)
MCV: 91 fL (ref 79.5–101.0)
MONO#: 0.5 10*3/uL (ref 0.1–0.9)
MONO%: 9.3 % (ref 0.0–14.0)
NEUT%: 57.6 % (ref 38.4–76.8)
NEUTROS ABS: 3.1 10*3/uL (ref 1.5–6.5)
Platelets: 160 10*3/uL (ref 145–400)
RBC: 4.55 10*6/uL (ref 3.70–5.45)
RDW: 13 % (ref 11.2–14.5)
WBC: 5.3 10*3/uL (ref 3.9–10.3)

## 2017-08-30 LAB — COMPREHENSIVE METABOLIC PANEL
ALBUMIN: 4.1 g/dL (ref 3.5–5.0)
ALK PHOS: 56 U/L (ref 40–150)
ALT: 8 U/L (ref 0–55)
AST: 20 U/L (ref 5–34)
Anion Gap: 10 mEq/L (ref 3–11)
BUN: 16.7 mg/dL (ref 7.0–26.0)
CALCIUM: 9.7 mg/dL (ref 8.4–10.4)
CO2: 26 mEq/L (ref 22–29)
Chloride: 105 mEq/L (ref 98–109)
Creatinine: 0.9 mg/dL (ref 0.6–1.1)
EGFR: >60 mL/min/{1.73_m2} (ref >60)
GLUCOSE: 99 mg/dL (ref 70–140)
POTASSIUM: 4.3 meq/L (ref 3.5–5.1)
SODIUM: 141 meq/L (ref 136–145)
Total Bilirubin: 0.51 mg/dL (ref 0.20–1.20)
Total Protein: 7.2 g/dL (ref 6.4–8.3)

## 2017-08-30 MED ORDER — FULVESTRANT 250 MG/5ML IM SOLN
500.0000 mg | Freq: Once | INTRAMUSCULAR | Status: AC
  Administered 2017-08-30: 500 mg via INTRAMUSCULAR
  Filled 2017-08-30: qty 10

## 2017-08-30 NOTE — Telephone Encounter (Signed)
Gave patient avs and calendar with appt per 10/31 los.

## 2017-08-30 NOTE — Patient Instructions (Signed)

## 2017-08-31 LAB — CANCER ANTIGEN 27.29: CAN 27.29: 309.7 U/mL — AB (ref 0.0–38.6)

## 2017-09-13 ENCOUNTER — Telehealth: Payer: Self-pay | Admitting: Oncology

## 2017-09-13 NOTE — Telephone Encounter (Signed)
Spoke with patient regarding changes in her schedule due to Gm being out of the office

## 2017-09-27 ENCOUNTER — Ambulatory Visit (HOSPITAL_BASED_OUTPATIENT_CLINIC_OR_DEPARTMENT_OTHER): Payer: Medicare Other

## 2017-09-27 ENCOUNTER — Other Ambulatory Visit: Payer: Self-pay | Admitting: *Deleted

## 2017-09-27 ENCOUNTER — Other Ambulatory Visit (HOSPITAL_BASED_OUTPATIENT_CLINIC_OR_DEPARTMENT_OTHER): Payer: Medicare Other

## 2017-09-27 VITALS — BP 179/66 | HR 71 | Temp 98.4°F | Resp 18

## 2017-09-27 DIAGNOSIS — C50812 Malignant neoplasm of overlapping sites of left female breast: Secondary | ICD-10-CM

## 2017-09-27 DIAGNOSIS — Z170 Estrogen receptor positive status [ER+]: Principal | ICD-10-CM

## 2017-09-27 DIAGNOSIS — C50912 Malignant neoplasm of unspecified site of left female breast: Secondary | ICD-10-CM

## 2017-09-27 DIAGNOSIS — Z5111 Encounter for antineoplastic chemotherapy: Secondary | ICD-10-CM | POA: Diagnosis not present

## 2017-09-27 LAB — CBC WITH DIFFERENTIAL/PLATELET
BASO%: 0.5 % (ref 0.0–2.0)
Basophils Absolute: 0 10*3/uL (ref 0.0–0.1)
EOS%: 3.7 % (ref 0.0–7.0)
Eosinophils Absolute: 0.2 10*3/uL (ref 0.0–0.5)
HEMATOCRIT: 40.1 % (ref 34.8–46.6)
HGB: 13.4 g/dL (ref 11.6–15.9)
LYMPH#: 1.5 10*3/uL (ref 0.9–3.3)
LYMPH%: 27.7 % (ref 14.0–49.7)
MCH: 30 pg (ref 25.1–34.0)
MCHC: 33.4 g/dL (ref 31.5–36.0)
MCV: 89.7 fL (ref 79.5–101.0)
MONO#: 0.5 10*3/uL (ref 0.1–0.9)
MONO%: 9.4 % (ref 0.0–14.0)
NEUT%: 58.7 % (ref 38.4–76.8)
NEUTROS ABS: 3.3 10*3/uL (ref 1.5–6.5)
PLATELETS: 171 10*3/uL (ref 145–400)
RBC: 4.47 10*6/uL (ref 3.70–5.45)
RDW: 13 % (ref 11.2–14.5)
WBC: 5.6 10*3/uL (ref 3.9–10.3)

## 2017-09-27 LAB — COMPREHENSIVE METABOLIC PANEL
ALBUMIN: 4.2 g/dL (ref 3.5–5.0)
ALK PHOS: 57 U/L (ref 40–150)
ALT: 7 U/L (ref 0–55)
ANION GAP: 8 meq/L (ref 3–11)
AST: 18 U/L (ref 5–34)
BILIRUBIN TOTAL: 0.5 mg/dL (ref 0.20–1.20)
BUN: 17.7 mg/dL (ref 7.0–26.0)
CALCIUM: 9.7 mg/dL (ref 8.4–10.4)
CO2: 26 mEq/L (ref 22–29)
Chloride: 107 mEq/L (ref 98–109)
Creatinine: 0.9 mg/dL (ref 0.6–1.1)
Glucose: 103 mg/dl (ref 70–140)
POTASSIUM: 4.1 meq/L (ref 3.5–5.1)
SODIUM: 141 meq/L (ref 136–145)
Total Protein: 7.1 g/dL (ref 6.4–8.3)

## 2017-09-27 MED ORDER — FULVESTRANT 250 MG/5ML IM SOLN
500.0000 mg | Freq: Once | INTRAMUSCULAR | Status: AC
  Administered 2017-09-27: 500 mg via INTRAMUSCULAR
  Filled 2017-09-27: qty 10

## 2017-09-28 LAB — CANCER ANTIGEN 27.29: CAN 27.29: 280.8 U/mL — AB (ref 0.0–38.6)

## 2017-10-13 ENCOUNTER — Ambulatory Visit
Admission: RE | Admit: 2017-10-13 | Discharge: 2017-10-13 | Disposition: A | Discharge status: Home / Self Care | Payer: Medicare Other | Source: Ambulatory Visit | Attending: Oncology | Admitting: Oncology

## 2017-10-13 DIAGNOSIS — C50912 Malignant neoplasm of unspecified site of left female breast: Secondary | ICD-10-CM

## 2017-10-26 ENCOUNTER — Ambulatory Visit: Payer: Medicare Other | Admitting: Oncology

## 2017-10-26 ENCOUNTER — Ambulatory Visit: Payer: Medicare Other

## 2017-10-26 ENCOUNTER — Other Ambulatory Visit: Payer: Medicare Other

## 2017-10-27 ENCOUNTER — Ambulatory Visit (HOSPITAL_BASED_OUTPATIENT_CLINIC_OR_DEPARTMENT_OTHER): Payer: Medicare Other | Admitting: Oncology

## 2017-10-27 ENCOUNTER — Ambulatory Visit (HOSPITAL_BASED_OUTPATIENT_CLINIC_OR_DEPARTMENT_OTHER): Payer: Medicare Other

## 2017-10-27 ENCOUNTER — Telehealth: Payer: Self-pay | Admitting: Oncology

## 2017-10-27 ENCOUNTER — Other Ambulatory Visit (HOSPITAL_BASED_OUTPATIENT_CLINIC_OR_DEPARTMENT_OTHER): Payer: Medicare Other

## 2017-10-27 VITALS — BP 182/82 | HR 67 | Temp 97.8°F | Resp 20 | Wt 154.5 lb

## 2017-10-27 DIAGNOSIS — C50912 Malignant neoplasm of unspecified site of left female breast: Secondary | ICD-10-CM

## 2017-10-27 DIAGNOSIS — Z170 Estrogen receptor positive status [ER+]: Principal | ICD-10-CM

## 2017-10-27 DIAGNOSIS — Z5111 Encounter for antineoplastic chemotherapy: Secondary | ICD-10-CM

## 2017-10-27 DIAGNOSIS — C50412 Malignant neoplasm of upper-outer quadrant of left female breast: Secondary | ICD-10-CM

## 2017-10-27 DIAGNOSIS — C773 Secondary and unspecified malignant neoplasm of axilla and upper limb lymph nodes: Secondary | ICD-10-CM

## 2017-10-27 DIAGNOSIS — C50812 Malignant neoplasm of overlapping sites of left female breast: Secondary | ICD-10-CM

## 2017-10-27 LAB — CBC WITH DIFFERENTIAL/PLATELET
BASO%: 0.7 % (ref 0.0–2.0)
Basophils Absolute: 0 10*3/uL (ref 0.0–0.1)
EOS ABS: 0.3 10*3/uL (ref 0.0–0.5)
EOS%: 5.1 % (ref 0.0–7.0)
HCT: 42 % (ref 34.8–46.6)
HGB: 13.9 g/dL (ref 11.6–15.9)
LYMPH%: 30.1 % (ref 14.0–49.7)
MCH: 29.7 pg (ref 25.1–34.0)
MCHC: 33.1 g/dL (ref 31.5–36.0)
MCV: 89.8 fL (ref 79.5–101.0)
MONO#: 0.5 10*3/uL (ref 0.1–0.9)
MONO%: 8.8 % (ref 0.0–14.0)
NEUT%: 55.3 % (ref 38.4–76.8)
NEUTROS ABS: 3.1 10*3/uL (ref 1.5–6.5)
Platelets: 177 10*3/uL (ref 145–400)
RBC: 4.68 10*6/uL (ref 3.70–5.45)
RDW: 12.9 % (ref 11.2–14.5)
WBC: 5.7 10*3/uL (ref 3.9–10.3)
lymph#: 1.7 10*3/uL (ref 0.9–3.3)

## 2017-10-27 LAB — COMPREHENSIVE METABOLIC PANEL
ALK PHOS: 58 U/L (ref 40–150)
ALT: 8 U/L (ref 0–55)
ANION GAP: 8 meq/L (ref 3–11)
AST: 19 U/L (ref 5–34)
Albumin: 4.2 g/dL (ref 3.5–5.0)
BUN: 20.5 mg/dL (ref 7.0–26.0)
CALCIUM: 9.7 mg/dL (ref 8.4–10.4)
CHLORIDE: 106 meq/L (ref 98–109)
CO2: 27 mEq/L (ref 22–29)
CREATININE: 0.9 mg/dL (ref 0.6–1.1)
EGFR: 59 mL/min/{1.73_m2} — ABNORMAL LOW (ref >60)
Glucose: 96 mg/dl (ref 70–140)
POTASSIUM: 4.8 meq/L (ref 3.5–5.1)
Sodium: 140 mEq/L (ref 136–145)
Total Bilirubin: 0.62 mg/dL (ref 0.20–1.20)
Total Protein: 7.2 g/dL (ref 6.4–8.3)

## 2017-10-27 MED ORDER — FULVESTRANT 250 MG/5ML IM SOLN
500.0000 mg | Freq: Once | INTRAMUSCULAR | Status: AC
  Administered 2017-10-27: 500 mg via INTRAMUSCULAR

## 2017-10-27 MED ORDER — FULVESTRANT 250 MG/5ML IM SOLN
INTRAMUSCULAR | Status: AC
  Filled 2017-10-27: qty 5

## 2017-10-27 NOTE — Patient Instructions (Signed)

## 2017-10-27 NOTE — Telephone Encounter (Signed)
Gave patient AVs and calendar of upcoming appointments.  °

## 2017-10-27 NOTE — Progress Notes (Signed)
[place]  [phone] Fax:([phone]) [phone]     ID: Jessica Glenn DOB: [birth year]  MR#: [id number]  VFI#:[id number]  Patient Care Team: Maury Dus, MD as PCP - General (Family Medicine) , Virgie Dad, MD as Consulting Physician (Oncology) Jovita Kussmaul, MD as Consulting Physician (General Surgery) OTHER MD:  CHIEF COMPLAINT: Locally advanced estrogen receptor positive left breast cancer  CURRENT TREATMENT: Neoadjuvant fulvestrant   HISTORY OF CURRENT ILLNESS: From the original intake note:  [name] tells me she first noted a changes in her left breast about a year and a half ago. She had had some dental work around that time and thought possibly that could be related. She said her breast felt "like steel". She did not bring it to medical attention until her recent appointment with Dr. Alyson Ingles and he set her up for bilateral diagnostic mammography with tomography and left breast ultrasonography at the [facility] 06/19/2017. This found the breast density to be category C. In the upper outer retroareolar left breast there was an irregular mass estimated to be at least 6 cm by mammography. There were heterogeneous calcifications within the mass. There was skin thickening of the areola and an enlarged inferior left axillary lymph node was noted. On exam there was a large fixed hard mass in the upper outer quadrant of the left breast causing protrusion of the areola. It measured approximately 7 cm by palpation and there was visible skin thickening. In the inferior left axilla there was a firm palpable mass measuring 2 cm.  Ultrasound confirmed an irregular hypoechoic vascular mass in the left breast centered on the 1:00 position 4 cm from the nipple measuring 6.7 cm. This was abutting the overlying skin. It was a 2.3 cm hypoechoic vascular mass in the left axilla. The right breast was unremarkable.  On 06/22/2017 the patient had left breast and left axillary node  biopsy, showing ([id number]) both sites to be involved by invasive ductal carcinoma, grade 2, with prognostic panels on both biopsies showing estrogen receptor 100% positivity, progesterone receptor 40-50% positivity, all with strong staining intensity, MIB-1:30 percent in the breast and 15% in the lymph nodes, and both HER-2 negative, with ratios of 0.76-0.85 and number per cell 1.10-1.30.  The patient's subsequent history is as detailed below.  INTERVAL HISTORY: Averiana returns today for follow-up and treatment of her estrogen receptor positive breast cancer accompanied by her husband. She continues on fulvestrant, with a dose due today. She reports very little pain and fatigue with the shots, but no other significant side effects.  Since her last visit, she underwent ultrasonography of the left breast on 10/13/2017 at [facility] showing: Interval response to therapy with slight decrease in size of the mass involving the upper left breast and the metastatic left axillary lymph node since August, 2018.   REVIEW OF SYSTEMS: [name] reports that her blood pressure is high again. She denies having any issues recently. She is wondering if the fulvestrant shots are depriving her body of estrogen because she doesn't feel as energetic as she used to. She notes that she has a general tired feeling. She notes that her hair is thinning and is not as youthful. She reports that she hasn't been exercising much recently due the weather, but she normally walks around her neighborhood. She denies unusual headaches, visual changes, nausea, vomiting, or dizziness. There has been no unusual cough, phlegm production, or pleurisy. This been no change in bowel or bladder habits. She denies  unexplained fatigue or unexplained weight loss, bleeding, rash, or fever. A detailed review of systems was otherwise stable.    PAST MEDICAL HISTORY: Past Medical History:  Diagnosis Date  . Ankle fracture, right   . Bilateral  cataracts    s/p surgery  . Breast CA ([place]) dx'd 05/2017   left  . History of tobacco use   . Hx of skin cancer, basal cell    s/p Moh's surgery   . Painful orthopaedic hardware ([name])    rt ankle    PAST SURGICAL HISTORY: Past Surgical History:  Procedure Laterality Date  . CATARACT EXTRACTION     both eyes  . HARDWARE REMOVAL Right 02/14/2017   Procedure: RIGHT ANKLE HARDWARE REMOVAL;  Surgeon: [name], MD;  Location: [facility];  Service: Orthopedics;  Laterality: Right;  . I&D EXTREMITY Right 02/14/2017   Procedure: RIGHT IRRIGATION AND DEBRIDEMENT ANKLE;  Surgeon: [name], MD;  Location: [facility];  Service: Orthopedics;  Laterality: Right;  . ORIF ANKLE FRACTURE Right 02/02/2016   Procedure: OPEN REDUCTION INTERNAL FIXATION (ORIF) ANKLE FRACTURE;  Surgeon: [name], MD;  Location: [facility];  Service: Orthopedics;  Laterality: Right;  stryker mini c arm reg bed bone foam    FAMILY HISTORY Family History  Problem Relation Age of Onset  . COPD Mother   The patient's father had a history of alcoholism and a band in the family. The patient has no information on the paternal side. The patient's mother died from emphysema at the age of 30. The patient has one sister, 2 brothers. There is no history of breast or ovarian cancer in the family to her knowledge.  GYNECOLOGIC HISTORY:  No LMP recorded. Patient is postmenopausal. Menarche age 38, first live birth age 67, the patient is [place] P4. She went through the change of life in her 47s. She took hormone replacement for a few years.   SOCIAL HISTORY:  The patient is originally from [place]. Her husband Peteis a traveling Theme park manager, nondenominational. Son peaked lives in [place] where he works as a Theme park manager; son Ronalee Belts lives in [place] where he works as an Optometrist; son Annie Main lives in [place] where he works as a Chief Strategy Officer; son Rodman Key also lives in [place]  and works as a Chief Strategy Officer. The patient has 14 grandchildren and 72 great-grandchildren    ADVANCED DIRECTIVES: In place   HEALTH MAINTENANCE: Social History   Tobacco Use  . Smoking status: Former Research scientist (life sciences)  . Smokeless tobacco: Never Used  Substance Use Topics  . Alcohol use: Yes    Alcohol/week: 0.0 oz    Comment: social  . Drug use: No     Colonoscopy:Never  PAP:  Bone density:Osteopenia   No Known Allergies  Current Outpatient Medications  Medication Sig Dispense Refill  . diphenhydramine-acetaminophen (TYLENOL PM) 25-500 MG TABS tablet Take 1 tablet by mouth at bedtime as needed.     No current facility-administered medications for this visit.     OBJECTIVE: Older white woman who appears younger than stated age 83:   10/27/17 0929  BP: (!) 182/82  Pulse: 67  Resp: 20  Temp: 97.8 F (36.6 C)  SpO2: 97%     Body mass index is 24.94 kg/m.   Wt Readings from Last 3 Encounters:  10/27/17 154 lb 8 oz (70.1 kg)  08/30/17 156 lb 11.2 oz (71.1 kg)  08/15/17 155 lb 11.2 oz (70.6 kg)      ECOG FS:1 -  Symptomatic but completely ambulatory  Sclerae unicteric, EOMs intact Oropharynx clear and moist No cervical or supraclavicular adenopathy Lungs no rales or rhonchi Heart regular rate and rhythm Abd soft, nontender, positive bowel sounds MSK no focal spinal tenderness, no upper extremity lymphedema Neuro: nonfocal, well oriented, appropriate affect Breasts: The right breast is benign.  In the left breast that is a large mass, which by palpation measures approximately 6 cm.  It is easily movable.  There is no associated erythema.  I do not palpate an axillary lymph node on the left or right axilla  LAB RESULTS:  CMP     Component Value Date/Time   NA 141 09/27/2017 1051   K 4.1 09/27/2017 1051   CL 108 01/28/2016 0459   CO2 26 09/27/2017 1051   GLUCOSE 103 09/27/2017 1051   BUN 17.7 09/27/2017 1051   CREATININE 0.9 09/27/2017 1051   CALCIUM 9.7 09/27/2017  1051   PROT 7.1 09/27/2017 1051   ALBUMIN 4.2 09/27/2017 1051   AST 18 09/27/2017 1051   ALT 7 09/27/2017 1051   ALKPHOS 57 09/27/2017 1051   BILITOT 0.50 09/27/2017 1051   GFRNONAA 53 (L) 01/28/2016 0459   GFRAA >60 01/28/2016 0459    No results found for: TOTALPROTELP, ALBUMINELP, A1GS, A2GS, BETS, BETA2SER, GAMS, MSPIKE, SPEI  No results found for: Nils Pyle, [facility]  Lab Results  Component Value Date   WBC 5.7 10/27/2017   NEUTROABS 3.1 10/27/2017   HGB 13.9 10/27/2017   HCT 42.0 10/27/2017   MCV 89.8 10/27/2017   PLT 177 10/27/2017     Lab Results  Component Value Date   CA2729 280.8 (H) 09/27/2017       Urinalysis No results found for: COLORURINE, APPEARANCEUR, LABSPEC, PHURINE, GLUCOSEU, HGBUR, BILIRUBINUR, KETONESUR, PROTEINUR, UROBILINOGEN, NITRITE, LEUKOCYTESUR   STUDIES: Since her last visit, she underwent ultrasonography of the left breast on 10/13/2017 at [facility] showing: Interval response to therapy with slight decrease in size of the mass involving the upper left breast and the metastatic leftaxillary lymph node since August, 2018.  US Breast Ltd Uni Left Inc Axilla  Result Date: 10/13/2017 CLINICAL DATA:  83 year old with biopsy-proven invasive ductal carcinoma and DCIS involving the upper outer quadrant of the left breast and metastatic left axillary lymphadenopathy in August, 2018. Both biopsy sites demonstrated tumors that are ER positive, PR positive and HER 2 Neu negative. Patient is currently undergoing hormonal chemoprevention with fulvestrant. Evaluate response to treatment. EXAM: ULTRASOUND OF THE LEFT BREAST COMPARISON:  06/22/2017, 06/19/2017. FINDINGS: On physical exam, there is a persistent large firm fixed mass involving the upper left breast on the order of 6 cm or so. There is a palpable left axillary lymph node. Targeted left breast ultrasound is performed, showing that the previously identified large hypoechoic  mass involving the upper left breast, centered at the 1 o'clock position approximately 3 cm from the nipple, biopsy-proven invasive ductal carcinoma and DCIS, has decreased slightly in size since the ultrasound on Mar 19, 2017, currently measuring approximately 3.4 x 5.0 x 6.2 cm (previously 3.5 x 5.0 x 6.7 cm). Sonographic evaluation of the left axilla shows that the biopsy-proven metastatic left axillary lymph node has also decreased slightly in size, currently measuring approximately 1.9 x 1.8 x 1.7 cm (previously 1.9 x 2.3 x 2.0 cm). IMPRESSION: Interval response to therapy with slight decrease in size of the mass involving the upper left breast and the metastatic left axillary lymph node since August, 2018. Measurements given above.  RECOMMENDATION: 1. Treatment plan. 2. While subsequent treatment may dictate earlier imaging, at a minimum the patient should have annual bilateral diagnostic mammography in August, 2019. I have discussed the findings and recommendations with the patient. Results were also provided in writing at the conclusion of the visit. If applicable, a reminder letter will be sent to the patient regarding the next appointment. BI-RADS CATEGORY  6: Known biopsy-proven malignancy. Electronically Signed   By: Evangeline Dakin M.D.   On: 10/13/2017 13:02    ELIGIBLE FOR AVAILABLE RESEARCH PROTOCOL: no ASSESSMENT: 83 y.o. [place] woman status post left breast overlapping sites biopsy 06/22/2017 for a clinical T3 N1-2, stage II-III invasive ductal carcinoma, grade 2, estrogen and progesterone receptor positive, HER-2 nonamplified, with an MIB-1 of 30%  (a) staging CT of the chest and bone survey and bone scan July 11, 2017 showed multiple indeterminant subcentimeter pulmonary nodules which will require follow-up, as well as an indeterminate right hepatic lobe lesion, but no definite evidence of malignancy.  (b) CA-27-29 is informative  (1) neo-adjuvant fulvestrant started on  07/05/2017  (2) definitive surgery to follow  (3) adjuvant radiation to follow surgery  (4) continue on anti-estrogens a minimum of 5 years  PLAN: Brightyn tolerates the fulvestrant well and is responding to it.  The CA-27-29 is dropping, and her tumor has shrunk a little.  Given the rate of decrease in size however I do not expect a major response.  I think 3 months from now her tumor will be marginally smaller again.  At that point very likely we will consider proceeding to left mastectomy and targeted axillary lymph node dissection.  She will likely need radiation after that and the plan will be to continue antiestrogens for a total of 5 years.  This could be anastrozole or tamoxifen depending on her choice at that time  [facility]  For now we are continuing the fulvestrant monthly.  She knows to call for any problems that may develop before the next visit , Virgie Dad, MD  10/27/17 9:39 AM Medical Oncology and Hematology [facility] [place], [place] [address] Tel. [phone]    Fax. [phone]  This document serves as a record of services personally performed by Lurline Del, MD. It was created on his behalf by Sheron Nightingale, a trained medical scribe. The creation of this record is based on the scribe's personal observations and the provider's statements to them.   I have reviewed the above documentation for accuracy and completeness, and I agree with the above.

## 2017-10-28 LAB — CANCER ANTIGEN 27.29: CA 27.29: 266.1 U/mL — ABNORMAL HIGH (ref 0.0–38.6)

## 2017-10-30 ENCOUNTER — Ambulatory Visit: Payer: Medicare Other

## 2017-10-30 ENCOUNTER — Ambulatory Visit: Payer: Medicare Other | Admitting: Oncology

## 2017-10-30 ENCOUNTER — Other Ambulatory Visit: Payer: Medicare Other

## 2017-11-22 ENCOUNTER — Inpatient Hospital Stay: Payer: Medicare Other

## 2017-11-22 ENCOUNTER — Inpatient Hospital Stay: Payer: Medicare Other | Attending: Oncology

## 2017-11-22 VITALS — BP 155/80 | HR 74 | Temp 98.0°F | Resp 20

## 2017-11-22 DIAGNOSIS — Z170 Estrogen receptor positive status [ER+]: Secondary | ICD-10-CM

## 2017-11-22 DIAGNOSIS — Z5111 Encounter for antineoplastic chemotherapy: Secondary | ICD-10-CM | POA: Diagnosis present

## 2017-11-22 DIAGNOSIS — C50412 Malignant neoplasm of upper-outer quadrant of left female breast: Secondary | ICD-10-CM | POA: Diagnosis present

## 2017-11-22 DIAGNOSIS — C50912 Malignant neoplasm of unspecified site of left female breast: Secondary | ICD-10-CM

## 2017-11-22 DIAGNOSIS — C50812 Malignant neoplasm of overlapping sites of left female breast: Secondary | ICD-10-CM

## 2017-11-22 LAB — CBC WITH DIFFERENTIAL/PLATELET
BASOS ABS: 0 10*3/uL (ref 0.0–0.1)
Basophils Relative: 0 %
EOS ABS: 0.2 10*3/uL (ref 0.0–0.5)
Eosinophils Relative: 4 %
HEMATOCRIT: 41.3 % (ref 34.8–46.6)
Hemoglobin: 13.6 g/dL (ref 11.6–15.9)
Lymphocytes Relative: 28 %
Lymphs Abs: 1.7 10*3/uL (ref 0.9–3.3)
MCH: 30.3 pg (ref 25.1–34.0)
MCHC: 32.9 g/dL (ref 31.5–36.0)
MCV: 92 fL (ref 79.5–101.0)
MONO ABS: 0.7 10*3/uL (ref 0.1–0.9)
Monocytes Relative: 11 %
NEUTROS ABS: 3.7 10*3/uL (ref 1.5–6.5)
Neutrophils Relative %: 57 %
PLATELETS: 173 10*3/uL (ref 145–400)
RBC: 4.49 MIL/uL (ref 3.70–5.45)
RDW: 12.6 % (ref 11.2–16.1)
WBC: 6.3 10*3/uL (ref 3.9–10.3)

## 2017-11-22 LAB — COMPREHENSIVE METABOLIC PANEL
ALBUMIN: 4 g/dL (ref 3.5–5.0)
ALK PHOS: 63 U/L (ref 40–150)
ALT: 8 U/L (ref 0–55)
ANION GAP: 8 (ref 3–11)
AST: 17 U/L (ref 5–34)
BUN: 24 mg/dL (ref 7–26)
CO2: 27 mmol/L (ref 22–29)
CREATININE: 0.94 mg/dL (ref 0.60–1.10)
Calcium: 9.6 mg/dL (ref 8.4–10.4)
Chloride: 105 mmol/L (ref 98–109)
GFR calc non Af Amer: 53 mL/min — ABNORMAL LOW (ref >60)
GLUCOSE: 94 mg/dL (ref 70–140)
Potassium: 4.7 mmol/L (ref 3.3–4.7)
Sodium: 140 mmol/L (ref 136–145)
TOTAL PROTEIN: 7.2 g/dL (ref 6.4–8.3)
Total Bilirubin: 0.4 mg/dL (ref 0.2–1.2)

## 2017-11-22 MED ORDER — FULVESTRANT 250 MG/5ML IM SOLN
500.0000 mg | Freq: Once | INTRAMUSCULAR | Status: AC
  Administered 2017-11-22: 500 mg via INTRAMUSCULAR

## 2017-11-22 MED ORDER — FULVESTRANT 250 MG/5ML IM SOLN
INTRAMUSCULAR | Status: AC
  Filled 2017-11-22: qty 5

## 2017-11-22 NOTE — Patient Instructions (Signed)

## 2017-11-23 LAB — CANCER ANTIGEN 27.29: CA 27.29: 237.1 U/mL — ABNORMAL HIGH (ref 0.0–38.6)

## 2017-12-20 ENCOUNTER — Inpatient Hospital Stay: Payer: Medicare Other

## 2017-12-20 ENCOUNTER — Inpatient Hospital Stay: Payer: Medicare Other | Attending: Oncology

## 2017-12-20 VITALS — BP 128/53 | HR 75 | Temp 98.3°F | Resp 18

## 2017-12-20 DIAGNOSIS — C50912 Malignant neoplasm of unspecified site of left female breast: Secondary | ICD-10-CM

## 2017-12-20 DIAGNOSIS — Z5111 Encounter for antineoplastic chemotherapy: Secondary | ICD-10-CM | POA: Diagnosis present

## 2017-12-20 DIAGNOSIS — C50812 Malignant neoplasm of overlapping sites of left female breast: Secondary | ICD-10-CM

## 2017-12-20 DIAGNOSIS — Z170 Estrogen receptor positive status [ER+]: Secondary | ICD-10-CM

## 2017-12-20 LAB — CBC WITH DIFFERENTIAL/PLATELET
BASOS PCT: 1 %
Basophils Absolute: 0 10*3/uL (ref 0.0–0.1)
Eosinophils Absolute: 0.3 10*3/uL (ref 0.0–0.5)
Eosinophils Relative: 6 %
HEMATOCRIT: 42 % (ref 34.8–46.6)
Hemoglobin: 13.9 g/dL (ref 11.6–15.9)
LYMPHS PCT: 32 %
Lymphs Abs: 1.7 10*3/uL (ref 0.9–3.3)
MCH: 29.7 pg (ref 25.1–34.0)
MCHC: 33.1 g/dL (ref 31.5–36.0)
MCV: 89.7 fL (ref 79.5–101.0)
MONOS PCT: 9 %
Monocytes Absolute: 0.5 10*3/uL (ref 0.1–0.9)
NEUTROS ABS: 2.7 10*3/uL (ref 1.5–6.5)
NEUTROS PCT: 52 %
Platelets: 193 10*3/uL (ref 145–400)
RBC: 4.68 MIL/uL (ref 3.70–5.45)
RDW: 12.9 % (ref 11.2–14.5)
WBC: 5.1 10*3/uL (ref 3.9–10.3)

## 2017-12-20 LAB — COMPREHENSIVE METABOLIC PANEL
ALBUMIN: 4.2 g/dL (ref 3.5–5.0)
ALT: 8 U/L (ref 0–55)
ANION GAP: 10 (ref 3–11)
AST: 17 U/L (ref 5–34)
Alkaline Phosphatase: 58 U/L (ref 40–150)
BILIRUBIN TOTAL: 0.6 mg/dL (ref 0.2–1.2)
BUN: 23 mg/dL (ref 7–26)
CALCIUM: 10.1 mg/dL (ref 8.4–10.4)
CO2: 28 mmol/L (ref 22–29)
Chloride: 104 mmol/L (ref 98–109)
Creatinine, Ser: 1.02 mg/dL (ref 0.60–1.10)
GFR calc Af Amer: 56 mL/min — ABNORMAL LOW (ref >60)
GFR, EST NON AFRICAN AMERICAN: 48 mL/min — AB (ref >60)
GLUCOSE: 85 mg/dL (ref 70–140)
Potassium: 4.9 mmol/L (ref 3.5–5.1)
Sodium: 142 mmol/L (ref 136–145)
Total Protein: 7.3 g/dL (ref 6.4–8.3)

## 2017-12-20 MED ORDER — FULVESTRANT 250 MG/5ML IM SOLN
INTRAMUSCULAR | Status: AC
  Filled 2017-12-20: qty 5

## 2017-12-20 MED ORDER — FULVESTRANT 250 MG/5ML IM SOLN
500.0000 mg | Freq: Once | INTRAMUSCULAR | Status: AC
  Administered 2017-12-20: 500 mg via INTRAMUSCULAR

## 2017-12-20 NOTE — Patient Instructions (Signed)

## 2017-12-21 LAB — CANCER ANTIGEN 27.29: CA 27.29: 235.9 U/mL — ABNORMAL HIGH (ref 0.0–38.6)

## 2018-01-10 ENCOUNTER — Ambulatory Visit
Admission: RE | Admit: 2018-01-10 | Discharge: 2018-01-10 | Disposition: A | Discharge status: Home / Self Care | Payer: Medicare Other | Source: Ambulatory Visit | Attending: Oncology | Admitting: Oncology

## 2018-01-10 DIAGNOSIS — C50812 Malignant neoplasm of overlapping sites of left female breast: Secondary | ICD-10-CM

## 2018-01-10 DIAGNOSIS — Z170 Estrogen receptor positive status [ER+]: Secondary | ICD-10-CM

## 2018-01-10 DIAGNOSIS — C50912 Malignant neoplasm of unspecified site of left female breast: Secondary | ICD-10-CM

## 2018-01-16 NOTE — Progress Notes (Signed)
Jessica Glenn  Telephone:([phone]) [phone] Fax:([phone]) [phone]     ID: Jessica Glenn DOB: [birth year]  MR#: [id number]  ZHY#:[id number]  Patient Care Team: Maury Dus, MD as PCP - General (Family Medicine) Magrinat, Virgie Dad, MD as Consulting Physician (Oncology) Jovita Kussmaul, MD as Consulting Physician (General Surgery) OTHER MD:  CHIEF COMPLAINT: Locally advanced/metastatic estrogen receptor positive left breast cancer  CURRENT TREATMENT: Neoadjuvant fulvestrant   HISTORY OF CURRENT ILLNESS: From the original intake note:  Jessica Glenn tells me she first noted a changes in her left breast about a year and a half ago. She had had some dental work around that time and thought possibly that could be related. She said her breast felt "like steel". She did not bring it to medical attention until her recent appointment with Dr. Alyson Ingles and he set her up for bilateral diagnostic mammography with tomography and left breast ultrasonography at the [facility] 06/19/2017. This found the breast density to be category C. In the upper outer retroareolar left breast there was an irregular mass estimated to be at least 6 cm by mammography. There were heterogeneous calcifications within the mass. There was skin thickening of the areola and an enlarged inferior left axillary lymph node was noted. On exam there was a large fixed hard mass in the upper outer quadrant of the left breast causing protrusion of the areola. It measured approximately 7 cm by palpation and there was visible skin thickening. In the inferior left axilla there was a firm palpable mass measuring 2 cm.  Ultrasound confirmed an irregular hypoechoic vascular mass in the left breast centered on the 1:00 position 4 cm from the nipple measuring 6.7 cm. This was abutting the overlying skin. It was a 2.3 cm hypoechoic vascular mass in the left axilla. The right breast was unremarkable.  On 06/22/2017 the patient had left breast and left  axillary node biopsy, showing ([id number]) both sites to be involved by invasive ductal carcinoma, grade 2, with prognostic panels on both biopsies showing estrogen receptor 100% positivity, progesterone receptor 40-50% positivity, all with strong staining intensity, MIB-1:30 percent in the breast and 15% in the lymph nodes, and both HER-2 negative, with ratios of 0.76-0.85 and number per cell 1.10-1.30.  The patient's subsequent history is as detailed below.  INTERVAL HISTORY: Jessica Glenn returns today for follow-up and treatment of her estrogen receptor positive breast cancer accompanied by her husband. She reports that she is doing well overall. She continues on fulvestrant, with a dose due today. She reports that she tolerates this well, however she notes on her last injection, she had residual nerve pain lasting 1-2 days following. She denies pain at the time of the injection.    Since her last visit to the office, she underwent a [place] left breast including axilla on 01/10/2018 with results showing: The mass in the upper left breast and axillary lymph node have similar measurements to the initial study dated 03/19/2017. Please see measurements above. Ultrasound is performed, showing an irregular hypoechoic mass in the left breast at 1 o'clock 3 cm from the nipple measuring 5.7 x 2.9 x 6.3 cm. Sonographic evaluation of the left axilla shows a biopsy proven metastatic lymph node measuring 2.1 x 2.1 x 1.1 cm.  This is in June 19, 2017 the mass in the left breast 1 o'clock position measured 6.7 cm)  Her baseline CA-27-29 was 314.8.  There has been some improvement, correlating well with the changes seen on ultrasonography  Ref Range &  Units 4wk ago 42moago 226mogo  CA 27.29 0.0 - 38.6 U/mL 235.9 Abnormally high   237.1 Abnormally high  CM 266.1 Abnormally high  CM     REVIEW OF SYSTEMS: JaDarilyneports that for exercise, she walks occasionally and when she does walk, she walks roughly 2 miles. She has been  evaluated by Dr. ToMarlou Starksn the past. She is unsure if she would like reconstruction if she were to have a mastectomy. She denies unusual headaches, visual changes, nausea, vomiting, or dizziness. There has been no unusual cough, phlegm production, or pleurisy. This been no change in bowel or bladder habits. She denies unexplained fatigue or unexplained weight loss, bleeding, rash, or fever. A detailed review of systems was otherwise stable.        PAST MEDICAL HISTORY: Past Medical History:  Diagnosis Date  . Ankle fracture, right   . Bilateral cataracts    s/p surgery  . [place]   left  . History of tobacco use   . Hx of skin cancer, basal cell    s/p Moh's surgery   . Painful orthopaedic hardware ([facility]   rt ankle    PAST SURGICAL HISTORY: Past Surgical History:  Procedure Laterality Date  . CATARACT EXTRACTION     both eyes  . HARDWARE REMOVAL Right 02/14/2017   Procedure: RIGHT ANKLE HARDWARE REMOVAL;  Surgeon: TiRenette ButtersMD;  [place] [facility];  Laterality: Right;  . I&D EXTREMITY Right 02/14/2017   Procedure: RIGHT IRRIGATION AND DEBRIDEMENT ANKLE;  Surgeon: TiRenette ButtersMD;  [place] [facility]: [place];  Laterality: Right;  . ORIF ANKLE FRACTURE Right 02/02/2016   Procedure: OPEN REDUCTION INTERNAL FIXATION (ORIF) ANKLE FRACTURE;  Surgeon: TiRenette ButtersMD;  [place] [facility]: [place];  Laterality: Right;  stryker mini c arm reg bed bone foam    FAMILY HISTORY Family History  Problem Relation Age of Onset  . COPD Mother   The patient's father had a history of alcoholism and a band in the family. The patient has no information on the paternal side. The patient's mother died from emphysema at the age of 8238The patient has one sister, 2 brothers. There is no history of breast or ovarian cancer in the family to her knowledge.  GYNECOLOGIC  HISTORY:  No LMP recorded. Patient is postmenopausal. Menarche age 89first live birth age 84the patient is GXLoma4. She went through the change of life in her 84She took hormone replacement for a few years.   SOCIAL HISTORY:  [place] husband Pete[place] a traveling paTheme park managernondenominational. Son [place] lives in [place] he works as a paTheme park managerson MiRonalee Beltsives in [place] he works as an acOptometristson StAnnie Mainives in [place] he works as a coChief Strategy Officerson MaRodman Keylso lives in [place] works as a coChief Strategy OfficerThe patient has 14 grandchildren and 1731reat-grandchildren    ADVANCED DIRECTIVES: In place   HEALTH MAINTENANCE: Social History   Tobacco Use  . Smoking status: Former SmResearch scientist (life sciences). Smokeless tobacco: Never Used  Substance Use Topics  . Alcohol use: Yes    Alcohol/week: 0.0 oz    Comment: social  . Drug use: No     Colonoscopy:Never  PAP:  Bone density:Osteopenia   No Known Allergies  Current Outpatient Medications  Medication Sig Dispense Refill  .  diphenhydramine-acetaminophen (TYLENOL PM) 25-500 MG TABS tablet Take 1 tablet by mouth at bedtime as needed.     No current facility-administered medications for this visit.     OBJECTIVE: Older white woman in no acute distress  Vitals:   01/17/18 0845  BP: (!) 165/75  Pulse: 66  Resp: 18  Temp: 98.5 F (36.9 C)  SpO2: 97%     Body mass index is 24.29 kg/m.   Wt Readings from Last 3 Encounters:  01/17/18 150 lb 8 oz (68.3 kg)  10/27/17 154 lb 8 oz (70.1 kg)  08/30/17 156 lb 11.2 oz (71.1 kg)      ECOG FS:1 - Symptomatic but completely ambulatory  Sclerae unicteric, pupils round and equal Oropharynx clear and moist No cervical or supraclavicular adenopathy Lungs no rales or rhonchi Heart regular rate and rhythm Abd soft, nontender, positive bowel sounds MSK no focal spinal tenderness, no upper extremity lymphedema Neuro: nonfocal, well  oriented, appropriate affect Breasts: The right breast is unremarkable.  The left breast has an easily palpable mass involving most of the upper outer quadrant and reaching to the nipple.  There is no overlying erythema.  There is palpable left axillary adenopathy.  LAB RESULTS:  CMP     Component Value Date/Time   NA 142 12/20/2017 0940   NA 140 10/27/2017 0908   K 4.9 12/20/2017 0940   K 4.8 10/27/2017 0908   CL 104 12/20/2017 0940   CO2 28 12/20/2017 0940   CO2 27 10/27/2017 0908   GLUCOSE 85 12/20/2017 0940   GLUCOSE 96 10/27/2017 0908   BUN 23 12/20/2017 0940   BUN 20.5 10/27/2017 0908   CREATININE 1.02 12/20/2017 0940   CREATININE 0.9 10/27/2017 0908   CALCIUM 10.1 12/20/2017 0940   CALCIUM 9.7 10/27/2017 0908   PROT 7.3 12/20/2017 0940   PROT 7.2 10/27/2017 0908   ALBUMIN 4.2 12/20/2017 0940   ALBUMIN 4.2 10/27/2017 0908   AST 17 12/20/2017 0940   AST 19 10/27/2017 0908   ALT 8 12/20/2017 0940   ALT 8 10/27/2017 0908   ALKPHOS 58 12/20/2017 0940   ALKPHOS 58 10/27/2017 0908   BILITOT 0.6 12/20/2017 0940   BILITOT 0.62 10/27/2017 0908   GFRNONAA 48 (L) 12/20/2017 0940   GFRAA 56 (L) 12/20/2017 0940    No results found for: TOTALPROTELP, ALBUMINELP, A1GS, A2GS, BETS, BETA2SER, GAMS, MSPIKE, SPEI  No results found for: KPAFRELGTCHN, LAMBDASER, KAPLAMBRATIO  Lab Results  Component Value Date   WBC 5.7 01/17/2018   NEUTROABS 2.8 01/17/2018   HGB 13.6 01/17/2018   HCT 40.5 01/17/2018   MCV 89.6 01/17/2018   PLT 179 01/17/2018     Lab Results  Component Value Date   CA2729 235.9 (H) 12/20/2017       Urinalysis No results found for: COLORURINE, APPEARANCEUR, LABSPEC, PHURINE, GLUCOSEU, HGBUR, BILIRUBINUR, KETONESUR, PROTEINUR, UROBILINOGEN, NITRITE, LEUKOCYTESUR   STUDIES:   US Breast Ltd Uni Left Inc Axilla  Result Date: 01/10/2018 CLINICAL DATA:  84 year old female with biopsy-proven invasive ductal carcinoma and DCIS involving the upper outer  quadrant of the left breast and metastatic left axillary adenopathy diagnosed in August of 2018. Patient is currently undergoing hormonal chemoprevention with fulvestrant. Assess response to treatment. EXAM: ULTRASOUND OF THE LEFT BREAST COMPARISON:  Previous exam(s). FINDINGS: On physical exam, there is a large firm mass in the upper-outer quadrant of the left breast. Targeted ultrasound is performed, showing an irregular hypoechoic mass in the left breast at 1 o'clock 3 cm  from the nipple measuring 5.7 x 2.9 x 6.3 cm. On the 10/13/2017 study it measured 5.0 x 3.4 x 6.2 cm and on the 03/19/2017 study measured 5.0 x 3.5 x 6.7 cm. Sonographic evaluation of the left axilla shows a biopsy proven metastatic lymph node measuring 2.1 x 2.1 x 1.1 cm. On the prior ultrasound dated 10/13/2017 it measured 1.9 x 1.8 x 1.7 cm and on the ultrasound dated 03/19/2017 it measured 1.9 x 2.3 x 2.0 cm. IMPRESSION: The mass in the upper left breast and axillary lymph node have similar measurements to the initial study dated 03/19/2017. Please see measurements above. RECOMMENDATION: Treatment planning of the known left breast cancer and axillary metastasis is recommended. I have discussed the findings and recommendations with the patient. Results were also provided in writing at the conclusion of the visit. If applicable, a reminder letter will be sent to the patient regarding the next appointment. BI-RADS CATEGORY  6: Known biopsy-proven malignancy. Electronically Signed   By: Lillia Mountain M.D.   On: 01/10/2018 13:03    ELIGIBLE FOR AVAILABLE RESEARCH PROTOCOL: no ASSESSMENT: 84 y.o. [place] woman status post left breast overlapping sites biopsy 06/22/2017 for a clinical T3 N1-2, stage II-III invasive ductal carcinoma, grade 2, estrogen and progesterone receptor positive, HER-2 nonamplified, with an MIB-1 of 30%  (a) staging CT of the chest and bone survey and bone scan July 11, 2017 showed multiple indeterminant  subcentimeter pulmonary nodules which will require follow-up, as well as an indeterminate right hepatic lobe lesion, but no definite evidence of malignancy.  (b) CA-27-29 is informative  (1) neo-adjuvant fulvestrant started on 07/05/2017  (2) definitive surgery pending  (3) adjuvant radiation to follow surgery  (4) continue on anti-estrogens a minimum of 5 years  PLAN: Cythina has been on fulvestrant now 6 months.  There has been some evidence of response by ultrasonography and CA-27-29.  However this has not been marked.  In particular as far as local control is concerned she is still going to need a mastectomy.  At this point she is ready to discuss that with her surgeon, and I have requested an appointment with him.  Today we discussed the possibility of reconstruction.  She really does not know if she wants that at all, but she would like to discuss it and of course Dr. Marlou Starks can set her up for a plastic surgeon at his discretion.  Her husband is very concerned because that they are being charged $400 a month for the fulvestrant.  I was not aware of this.  We do have other alternatives which may be as effective and I am starting her on anastrozole now.  If she tolerates the anastrozole well then when she returns to see me in 4 weeks for her next fulvestrant dose we will omit that and instead we will add palbociclib at that point.  Recall that she did have some lung spots that were indeterminate on her initial CT of the chest.  We are going to repeat that in the next couple of weeks to see if we can obtain more of an understanding regarding metastatic disease.  However her CA-27-29 elevation is strongly suggestive of stage IV disease  The importance of that is that that would qualify her for palbociclib.  They know to call for any other issues that may develop before her next visit.  Magrinat, Virgie Dad, MD  01/17/18 9:05 AM Medical Oncology and Hematology [facility]  [place], [place] [address] Tel. [phone]    Fax. [phone]    This document serves as a record of services personally performed by Lurline Del, MD. It was created on his behalf by Steva Colder, a trained medical scribe. The creation of this record is based on the scribe's personal observations and the provider's statements to them.   I have reviewed the above documentation for accuracy and completeness, and I agree with the above.

## 2018-01-17 ENCOUNTER — Inpatient Hospital Stay: Payer: Medicare Other

## 2018-01-17 ENCOUNTER — Inpatient Hospital Stay (HOSPITAL_BASED_OUTPATIENT_CLINIC_OR_DEPARTMENT_OTHER): Payer: Medicare Other | Admitting: Oncology

## 2018-01-17 ENCOUNTER — Telehealth: Payer: Self-pay | Admitting: Pharmacy Technician

## 2018-01-17 ENCOUNTER — Inpatient Hospital Stay: Payer: Medicare Other | Attending: Oncology

## 2018-01-17 ENCOUNTER — Telehealth: Payer: Self-pay | Admitting: Oncology

## 2018-01-17 VITALS — BP 165/75 | HR 66 | Temp 98.5°F | Resp 18 | Ht 66.0 in | Wt 150.5 lb

## 2018-01-17 DIAGNOSIS — C50912 Malignant neoplasm of unspecified site of left female breast: Secondary | ICD-10-CM

## 2018-01-17 DIAGNOSIS — C50812 Malignant neoplasm of overlapping sites of left female breast: Secondary | ICD-10-CM

## 2018-01-17 DIAGNOSIS — Z5111 Encounter for antineoplastic chemotherapy: Secondary | ICD-10-CM | POA: Insufficient documentation

## 2018-01-17 DIAGNOSIS — Z170 Estrogen receptor positive status [ER+]: Secondary | ICD-10-CM | POA: Diagnosis not present

## 2018-01-17 LAB — COMPREHENSIVE METABOLIC PANEL
ALBUMIN: 4.1 g/dL (ref 3.5–5.0)
ALT: 8 U/L (ref 0–55)
AST: 17 U/L (ref 5–34)
Alkaline Phosphatase: 58 U/L (ref 40–150)
Anion gap: 8 (ref 3–11)
BILIRUBIN TOTAL: 0.5 mg/dL (ref 0.2–1.2)
BUN: 19 mg/dL (ref 7–26)
CO2: 27 mmol/L (ref 22–29)
CREATININE: 0.9 mg/dL (ref 0.60–1.10)
Calcium: 10 mg/dL (ref 8.4–10.4)
Chloride: 107 mmol/L (ref 98–109)
GFR calc Af Amer: >60 mL/min (ref >60)
GFR calc non Af Amer: 56 mL/min — ABNORMAL LOW (ref >60)
GLUCOSE: 85 mg/dL (ref 70–140)
Potassium: 4.2 mmol/L (ref 3.5–5.1)
Sodium: 142 mmol/L (ref 136–145)
Total Protein: 7.1 g/dL (ref 6.4–8.3)

## 2018-01-17 LAB — CBC WITH DIFFERENTIAL/PLATELET
BASOS ABS: 0 10*3/uL (ref 0.0–0.1)
Basophils Relative: 1 %
Eosinophils Absolute: 0.4 10*3/uL (ref 0.0–0.5)
Eosinophils Relative: 6 %
HEMATOCRIT: 40.5 % (ref 34.8–46.6)
HEMOGLOBIN: 13.6 g/dL (ref 11.6–15.9)
LYMPHS PCT: 33 %
Lymphs Abs: 1.9 10*3/uL (ref 0.9–3.3)
MCH: 30 pg (ref 25.1–34.0)
MCHC: 33.5 g/dL (ref 31.5–36.0)
MCV: 89.6 fL (ref 79.5–101.0)
Monocytes Absolute: 0.6 10*3/uL (ref 0.1–0.9)
Monocytes Relative: 10 %
NEUTROS ABS: 2.8 10*3/uL (ref 1.5–6.5)
NEUTROS PCT: 50 %
Platelets: 179 10*3/uL (ref 145–400)
RBC: 4.52 MIL/uL (ref 3.70–5.45)
RDW: 13 % (ref 11.2–14.5)
WBC: 5.7 10*3/uL (ref 3.9–10.3)

## 2018-01-17 MED ORDER — FULVESTRANT 250 MG/5ML IM SOLN
INTRAMUSCULAR | Status: AC
  Filled 2018-01-17: qty 10

## 2018-01-17 MED ORDER — FULVESTRANT 250 MG/5ML IM SOLN
500.0000 mg | Freq: Once | INTRAMUSCULAR | Status: AC
  Administered 2018-01-17: 500 mg via INTRAMUSCULAR

## 2018-01-17 MED ORDER — ANASTROZOLE 1 MG PO TABS: 1.0000 mg | ORAL_TABLET | Freq: Every day | ORAL | 4 refills | Status: DC

## 2018-01-17 NOTE — Telephone Encounter (Signed)
Oral Oncology Patient Advocate Encounter  Received notification from [facility] that prior authorization for Leslee Home is required.  PA submitted on CoverMyMeds Key [id number] Status is pending  Oral Oncology Clinic will continue to follow.  Fabio Asa. Melynda Keller, [place] Patient [place] [phone] 01/17/2018 10:53 AM

## 2018-01-17 NOTE — Telephone Encounter (Signed)
Gave patient AVS

## 2018-01-17 NOTE — Patient Instructions (Signed)

## 2018-01-18 ENCOUNTER — Telehealth: Payer: Self-pay | Admitting: Pharmacist

## 2018-01-18 LAB — CANCER ANTIGEN 27.29: CAN 27.29: 225.1 U/mL — AB (ref 0.0–38.6)

## 2018-01-18 NOTE — Telephone Encounter (Signed)
Oral Oncology Patient Advocate Encounter  Prior Authorization for Jessica Glenn has been approved.    PA# [id number] Effective dates: 01/17/2018 through 10/30/2018  Oral Oncology Clinic will continue to follow.   Jessica Glenn. Jessica Glenn, [place] Patient [place] [phone] 01/18/2018 1:03 PM

## 2018-01-18 NOTE — Telephone Encounter (Addendum)
Oral Oncology Pharmacist Encounter  Received new prescription for Ibrance (palbociclib) for the treatment of advanced, hormone receptor positive breast cancer in conjunction with anastrozole, planned duration until disease progression or unacceptable toxicity. Noted patient not plan to start Ibrance until 02/13/2018   Labs from 01/17/2018 assessed, okay for treatment.  Current medication list in Epic reviewed, no DDIs with Ibrance identified.  Prescription will be sent to appropriate specialty pharmacy closer to the time of initiation of treatment.  Prior authorization is required by insurance, was submitted, and was approved.  Test claim at the pharmacy revealed copayment [phone] Oral oncology clinic will work with patient for options to make medication more affordable.  Oral Oncology Clinic will continue to follow for copayment issues, initial counseling and start date.  Johny Drilling, PharmD, BCPS, BCOP 01/18/2018 12:31 PM Oral Oncology Clinic [phone]

## 2018-01-23 ENCOUNTER — Telehealth: Payer: Self-pay | Admitting: Pharmacy Technician

## 2018-01-23 NOTE — Telephone Encounter (Signed)
Oral Oncology Patient Advocate Encounter  Was successful in securing patient an $ 8000 grant from [employer] [place] (PAF) to provide copayment coverage for her Ibrance.  This will keep the out of pocket expense at $0.    I have spoken with the patient.    The billing information is as follows and has been shared with [place] to be used when ready to process the patient's prescription.   Member ID: [id number] Group ID: [id number] RxBin: [id number] Dates of Eligibility: 07/26/2017 through 01/23/2019  Fabio Asa. Melynda Keller, [place] Patient [place] [phone] 01/23/2018 8:57 AM

## 2018-01-24 ENCOUNTER — Ambulatory Visit: Payer: Self-pay | Admitting: General Surgery

## 2018-01-24 DIAGNOSIS — C50112 Malignant neoplasm of central portion of left female breast: Secondary | ICD-10-CM

## 2018-01-24 DIAGNOSIS — Z170 Estrogen receptor positive status [ER+]: Principal | ICD-10-CM

## 2018-02-05 ENCOUNTER — Ambulatory Visit (HOSPITAL_COMMUNITY)
Admission: RE | Admit: 2018-02-05 | Discharge: 2018-02-05 | Disposition: A | Discharge status: Home / Self Care | Payer: Medicare Other | Source: Ambulatory Visit | Attending: Oncology | Admitting: Oncology

## 2018-02-05 DIAGNOSIS — R918 Other nonspecific abnormal finding of lung field: Secondary | ICD-10-CM | POA: Insufficient documentation

## 2018-02-05 DIAGNOSIS — C50912 Malignant neoplasm of unspecified site of left female breast: Secondary | ICD-10-CM

## 2018-02-05 DIAGNOSIS — C50812 Malignant neoplasm of overlapping sites of left female breast: Secondary | ICD-10-CM | POA: Diagnosis present

## 2018-02-05 DIAGNOSIS — C773 Secondary and unspecified malignant neoplasm of axilla and upper limb lymph nodes: Secondary | ICD-10-CM | POA: Diagnosis not present

## 2018-02-05 DIAGNOSIS — Z170 Estrogen receptor positive status [ER+]: Secondary | ICD-10-CM | POA: Diagnosis present

## 2018-02-05 MED ORDER — IOHEXOL 300 MG/ML  SOLN
75.0000 mL | Freq: Once | INTRAMUSCULAR | Status: AC | PRN
  Administered 2018-02-05: 75 mL via INTRAVENOUS

## 2018-02-05 NOTE — Telephone Encounter (Signed)
Oral Oncology Patient Advocate Encounter  Was successful in securing patient an additional $15,000 grant from [employer] to provide copayment coverage for [place]. This will keep the out of pocket expense at $0.   I have left a voicemail for the patient..   The billing information is as follows and has been shared with [place].   Member ID: [id number] Group ID: [id number] RxBin: [id number] Dates of Eligibility: 01/06/2018 through 01/06/2019  Fabio Asa. Melynda Keller, [place] Patient [place] [phone] 02/05/2018 10:54 AM

## 2018-02-06 ENCOUNTER — Telehealth: Payer: Self-pay

## 2018-02-06 ENCOUNTER — Other Ambulatory Visit: Payer: Self-pay | Admitting: Oncology

## 2018-02-06 NOTE — Progress Notes (Signed)
I called the patient and gave her results of her CT scan.  She understands she will go through the surgery as planned and then we will address the T2 lesion.

## 2018-02-06 NOTE — Pre-Procedure Instructions (Signed)
Jessica Glenn  02/06/2018      [facility] - [place] - [address] [place] [address] Phone: [phone] Fax: [phone]    Your procedure is scheduled on Wednesday April 17.  Report to [facility] Admitting at 6:30 A.M.  Call this number if you have problems the morning of surgery:  [phone]   Remember:  Do not eat food or drink liquids after midnight.  **Drink Ensure Pre-surgery drink prior to leaving home the morning of surgery**   Take these medicines the morning of surgery with A SIP OF WATER:  NONE  7 days prior to surgery STOP taking any Aspirin(unless otherwise instructed by your surgeon), Aleve, Naproxen, Ibuprofen, Motrin, Advil, Goody's, BC's, all herbal medications, fish oil, and all vitamins    Do not wear jewelry, make-up or nail polish.  Do not wear lotions, powders, or perfumes, or deodorant.  Do not shave 48 hours prior to surgery.  Men may shave face and neck.  Do not bring valuables to the hospital.  [facility] is not responsible for any belongings or valuables.  Contacts, dentures or bridgework may not be worn into surgery.  Leave your suitcase in the car.  After surgery it may be brought to your room.  For patients admitted to the hospital, discharge time will be determined by your treatment team.  Patients discharged the day of surgery will not be allowed to drive home.   Special instructions:    [place]- Preparing For Surgery  Before surgery, you can play an important role. Because skin is not sterile, your skin needs to be as free of germs as possible. You can reduce the number of germs on your skin by washing with CHG (chlorahexidine gluconate) Soap before surgery.  CHG is an antiseptic cleaner which kills germs and bonds with the skin to continue killing germs even after washing.  Please do not use if you have an allergy to CHG or antibacterial soaps. If  your skin becomes reddened/irritated stop using the CHG.  Do not shave (including legs and underarms) for at least 48 hours prior to first CHG shower. It is OK to shave your face.  Please follow these instructions carefully.   1. Shower the NIGHT BEFORE SURGERY and the MORNING OF SURGERY with CHG.   2. If you chose to wash your hair, wash your hair first as usual with your normal shampoo.  3. After you shampoo, rinse your hair and body thoroughly to remove the shampoo.  4. Use CHG as you would any other liquid soap. You can apply CHG directly to the skin and wash gently with a scrungie or a clean washcloth.   5. Apply the CHG Soap to your body ONLY FROM THE NECK DOWN.  Do not use on open wounds or open sores. Avoid contact with your eyes, ears, mouth and genitals (private parts). Wash Face and genitals (private parts)  with your normal soap.  6. Wash thoroughly, paying special attention to the area where your surgery will be performed.  7. Thoroughly rinse your body with warm water from the neck down.  8. DO NOT shower/wash with your normal soap after using and rinsing off the CHG Soap.  9. Pat yourself dry with a CLEAN TOWEL.  10. Wear CLEAN PAJAMAS to bed the night before surgery, wear comfortable clothes the morning of surgery  11. Place CLEAN SHEETS on your bed the  night of your first shower and DO NOT SLEEP WITH PETS.    Day of Surgery: Do not apply any deodorants/lotions. Please wear clean clothes to the hospital/surgery center.      Please read over the following fact sheets that you were given. Coughing and Deep Breathing and Surgical Site Infection Prevention

## 2018-02-06 NOTE — Telephone Encounter (Signed)
Received VM from [facility]".  Called her back and received pt's most recent CT of chest results. Notified Dr Jana Hakim and he has already spoke with pt.

## 2018-02-07 ENCOUNTER — Other Ambulatory Visit: Payer: Self-pay

## 2018-02-07 ENCOUNTER — Encounter (HOSPITAL_COMMUNITY)
Admission: RE | Admit: 2018-02-07 | Discharge: 2018-02-07 | Disposition: A | Discharge status: Home / Self Care | Payer: Medicare Other | Source: Ambulatory Visit | Attending: General Surgery | Admitting: General Surgery

## 2018-02-07 ENCOUNTER — Encounter (HOSPITAL_COMMUNITY): Payer: Self-pay

## 2018-02-07 DIAGNOSIS — Z87891 Personal history of nicotine dependence: Secondary | ICD-10-CM | POA: Insufficient documentation

## 2018-02-07 DIAGNOSIS — Z85828 Personal history of other malignant neoplasm of skin: Secondary | ICD-10-CM | POA: Diagnosis not present

## 2018-02-07 DIAGNOSIS — Z170 Estrogen receptor positive status [ER+]: Secondary | ICD-10-CM | POA: Insufficient documentation

## 2018-02-07 DIAGNOSIS — Z01812 Encounter for preprocedural laboratory examination: Secondary | ICD-10-CM | POA: Diagnosis present

## 2018-02-07 DIAGNOSIS — C50912 Malignant neoplasm of unspecified site of left female breast: Secondary | ICD-10-CM | POA: Diagnosis not present

## 2018-02-07 HISTORY — DX: Asymptomatic varicose veins of unspecified lower extremity: I83.90

## 2018-02-07 LAB — BASIC METABOLIC PANEL
ANION GAP: 13 (ref 5–15)
BUN: 18 mg/dL (ref 6–20)
CALCIUM: 9.5 mg/dL (ref 8.9–10.3)
CO2: 22 mmol/L (ref 22–32)
Chloride: 105 mmol/L (ref 101–111)
Creatinine, Ser: 0.85 mg/dL (ref 0.44–1.00)
GFR calc Af Amer: >60 mL/min (ref >60)
GFR, EST NON AFRICAN AMERICAN: 60 mL/min — AB (ref >60)
GLUCOSE: 93 mg/dL (ref 65–99)
Potassium: 4.4 mmol/L (ref 3.5–5.1)
Sodium: 140 mmol/L (ref 135–145)

## 2018-02-07 LAB — CBC
HCT: 42.7 % (ref 36.0–46.0)
Hemoglobin: 14 g/dL (ref 12.0–15.0)
MCH: 30.2 pg (ref 26.0–34.0)
MCHC: 32.8 g/dL (ref 30.0–36.0)
MCV: 92 fL (ref 78.0–100.0)
Platelets: 165 10*3/uL (ref 150–400)
RBC: 4.64 MIL/uL (ref 3.87–5.11)
RDW: 13.3 % (ref 11.5–15.5)
WBC: 5 10*3/uL (ref 4.0–10.5)

## 2018-02-11 ENCOUNTER — Other Ambulatory Visit: Payer: Self-pay | Admitting: Oncology

## 2018-02-12 NOTE — Progress Notes (Signed)
[place]  Telephone:([phone] Fax:([phone]) [phone]     ID: Jessica Glenn DOB: 02-Feb-1929  MR#: [id number]  TGY#:[id number]  Patient Care Team: Jessica Dus, MD as PCP - General (Family Medicine) Jessica Glenn, Jessica Dad, MD as Consulting Physician (Oncology) Jessica Kussmaul, MD as Consulting Physician (General Surgery) OTHER MD:  CHIEF COMPLAINT: Locally advanced/metastatic estrogen receptor positive left breast cancer  CURRENT TREATMENT: anastrozole   HISTORY OF CURRENT ILLNESS: From the original intake note:  Jessica Glenn tells me she first noted a changes in her left breast about a year and a half ago. She had had some dental work around that time and thought possibly that could be related. She said her breast felt "like steel". She did not bring it to medical attention until her recent appointment with Jessica Glenn and he set her up for bilateral diagnostic mammography with tomography and left breast ultrasonography at the [facility] 06/19/2017. This found the breast density to be category C. In the upper outer retroareolar left breast there was an irregular mass estimated to be at least 6 cm by mammography. There were heterogeneous calcifications within the mass. There was skin thickening of the areola and an enlarged inferior left axillary lymph node was noted. On exam there was a large fixed hard mass in the upper outer quadrant of the left breast causing protrusion of the areola. It measured approximately 7 cm by palpation and there was visible skin thickening. In the inferior left axilla there was a firm palpable mass measuring 2 cm.  Ultrasound confirmed an irregular hypoechoic vascular mass in the left breast centered on the 1:00 position 4 cm from the nipple measuring 6.7 cm. This was abutting the overlying skin. It was a 2.3 cm hypoechoic vascular mass in the left axilla. The right breast was unremarkable.  On 06/22/2017 the patient had left breast and left axillary node  biopsy, showing ([id number]) both sites to be involved by invasive ductal carcinoma, grade 2, with prognostic panels on both biopsies showing estrogen receptor 100% positivity, progesterone receptor 40-50% positivity, all with strong staining intensity, MIB-1:30 percent in the breast and 15% in the lymph nodes, and both HER-2 negative, with ratios of 0.76-0.85 and number per cell 1.10-1.30.  The patient's subsequent history is as detailed below.  INTERVAL HISTORY: Jessica Glenn returns today for follow-up and treatment of her estrogen receptor positive breast cancer accompanied by her son, Jessica Glenn, and her husband Jessica Glenn.  Fulvestrant was discontinued after her last dose, 12/28/2017, because of concerns regarding cost.  Also we had some response but not a marked response.  If she tolerates anastrozole well, which was started a month ago, the plan was to consider palbociclib.   So far, she denies having issues with hot flashes or vaginal dryness. Her arthralgias and myalgias have not changed. She pays $4 for this medication.   She had a CT Chest with contrast completed on 02/05/2018 with results showing: Decrease in size of left breast mass and malignant left axillary lymph node. Approximately 4 pulmonary nodules in the right lower lobe are not changed in size. Lesions remain indeterminate but favored benign. No evidence of intrathoracic nodal metastasis. Unfortunately, lytic lesion in the posterior aspect of the T2 vertebral body is consistent with a skeletal metastasis.   She is scheduled for a left mastectomy with SNL/TAD/ALND (at surgeon's discretion) on 02/14/2018.   REVIEW OF SYSTEMS: Jessica Glenn reports that her mind feels cloudy and spaced out. Currently, she is experiencing occasional diarrhea  and loose stools about 1-2 times per day. For exercise, she walks about 3-4 times per week for 2 miles depending on the weather. Last week she walked 3 times for 40 minutes. She has also been spending time with her  family. She is embracing her health. She denies unusual headaches, visual changes, nausea, vomiting, or dizziness. There has been no unusual cough, phlegm production, or pleurisy. This been no change in bowel or bladder habits. She denies unexplained fatigue or unexplained weight loss, bleeding, rash, or fever. A detailed review of systems was otherwise stable.     PAST MEDICAL HISTORY: Past Medical History:  Diagnosis Date  . Ankle fracture, right   . Bilateral cataracts    s/p surgery  . Breast CA ([place]) dx'd 05/2017   left  . History of tobacco use   . Hx of skin cancer, basal cell    s/p Moh's surgery   . Painful orthopaedic hardware ([place])    rt ankle  . Varicose vein of leg     PAST SURGICAL HISTORY: Past Surgical History:  Procedure Laterality Date  . CATARACT EXTRACTION     both eyes  . HARDWARE REMOVAL Right 02/14/2017   Procedure: RIGHT ANKLE HARDWARE REMOVAL;  Surgeon: [name], MD;  Location: [facility];  Service: Orthopedics;  Laterality: Right;  . I&D EXTREMITY Right 02/14/2017   Procedure: RIGHT IRRIGATION AND DEBRIDEMENT ANKLE;  Surgeon: [name], MD;  Location: [facility];  Service: Orthopedics;  Laterality: Right;  . ORIF ANKLE FRACTURE Right 02/02/2016   Procedure: OPEN REDUCTION INTERNAL FIXATION (ORIF) ANKLE FRACTURE;  Surgeon: [name], MD;  Location: [facility];  Service: Orthopedics;  Laterality: Right;  stryker mini c arm reg bed bone foam    FAMILY HISTORY Family History  Problem Relation Age of Onset  . COPD Mother   The patient's father had a history of alcoholism and a band in the family. The patient has no information on the paternal side. The patient's mother died from emphysema at the age of 28. The patient has one sister, 2 brothers. There is no history of breast or ovarian cancer in the family to her knowledge.  GYNECOLOGIC HISTORY:  No LMP recorded. Patient is  postmenopausal. Menarche age 42, first live birth age 55, the patient is [name] P4. She went through the change of life in her 35s. She took hormone replacement for a few years.   SOCIAL HISTORY:  The patient is originally from [place]. Her husband Peteis a traveling Theme park manager, nondenominational. Son peaked lives in [place] where he works as a Theme park manager; son Ronalee Belts lives in [place] where he works as an Optometrist; son Annie Main lives in [place] where he works as a Chief Strategy Officer; son Rodman Key also lives in [place] and works as a Chief Strategy Officer. The patient has 14 grandchildren and 18 great-grandchildren    ADVANCED DIRECTIVES: In place   HEALTH MAINTENANCE: Social History   Tobacco Use  . Smoking status: Former Research scientist (life sciences)  . Smokeless tobacco: Never Used  Substance Use Topics  . Alcohol use: Yes    Alcohol/week: 0.0 oz    Comment: social  . Drug use: No     Colonoscopy:Never  PAP:  Bone density:Osteopenia   No Known Allergies  Current Outpatient Medications  Medication Sig Dispense Refill  . anastrozole (ARIMIDEX) 1 MG tablet Take 1 tablet (1 mg total) by mouth daily. (Patient taking differently: Take 1 mg by mouth every evening. ) 90 tablet 4  .  ascorbic acid (VITAMIN C) 1000 MG tablet Take 1,000 mg by mouth daily.    . Calcium Carb-Cholecalciferol (CALCIUM 600 + D PO) Take 1 tablet by mouth daily.    . Calcium-Magnesium-Zinc (CAL-MAG-ZINC PO) Take 1 tablet by mouth daily.    . Cholecalciferol (VITAMIN D3) 3000 units TABS Take 3,000 Units by mouth daily.    . Cyanocobalamin (B-12) 5000 MCG CAPS Take 5,000 mcg by mouth daily.    . diphenhydramine-acetaminophen (TYLENOL PM) 25-500 MG TABS tablet Take 1-2 tablets by mouth at bedtime.     Marland Kitchen doxylamine, Sleep, (UNISOM) 25 MG tablet Take 25 mg by mouth at bedtime as needed for sleep.    . Multiple Vitamin (MULTIVITAMIN WITH MINERALS) TABS tablet Take 1 tablet by mouth daily.    . Probiotic CAPS Take 1 capsule by mouth daily.    . Red Yeast  Rice Extract (RED YEAST RICE PO) Take 1,200 mg by mouth 3 (three) times a week.    Marland Kitchen VITAMIN E PO Take 180 Units by mouth daily.     No current facility-administered medications for this visit.     OBJECTIVE: Older white woman who appears stated age  37:   02/13/18 1250  BP: (!) 177/75  Pulse: 66  Resp: 18  Temp: 100 F (37.8 C)  SpO2: 100%     Body mass index is 24.44 kg/m.   Wt Readings from Last 3 Encounters:  02/13/18 151 lb 6.4 oz (68.7 kg)  02/07/18 151 lb 9.6 oz (68.8 kg)  01/17/18 150 lb 8 oz (68.3 kg)      ECOG FS:1 - Symptomatic but completely ambulatory  Sclerae unicteric, EOMs intact Oropharynx clear and moist No cervical or supraclavicular adenopathy Lungs no rales or rhonchi Heart regular rate and rhythm Abd soft, nontender, positive bowel sounds MSK no focal spinal tenderness, no upper extremity lymphedema Neuro: nonfocal, well oriented, appropriate affect Breasts: The right breast is benign.  The mass in the left breast measures about 3 cm, is movable, is not associated with erythema.  LAB RESULTS:  CMP     Component Value Date/Time   NA 140 02/13/2018 1145   NA 140 10/27/2017 0908   K 4.9 02/13/2018 1145   K 4.8 10/27/2017 0908   CL 107 02/13/2018 1145   CO2 25 02/13/2018 1145   CO2 27 10/27/2017 0908   GLUCOSE 107 02/13/2018 1145   GLUCOSE 96 10/27/2017 0908   BUN 23 02/13/2018 1145   BUN 20.5 10/27/2017 0908   CREATININE 0.84 02/13/2018 1145   CREATININE 0.9 10/27/2017 0908   CALCIUM 9.5 02/13/2018 1145   CALCIUM 9.7 10/27/2017 0908   PROT 7.2 02/13/2018 1145   PROT 7.2 10/27/2017 0908   ALBUMIN 4.2 02/13/2018 1145   ALBUMIN 4.2 10/27/2017 0908   AST 16 02/13/2018 1145   AST 19 10/27/2017 0908   ALT 7 02/13/2018 1145   ALT 8 10/27/2017 0908   ALKPHOS 68 02/13/2018 1145   ALKPHOS 58 10/27/2017 0908   BILITOT 0.4 02/13/2018 1145   BILITOT 0.62 10/27/2017 0908   GFRNONAA >60 02/13/2018 1145   GFRAA >60 02/13/2018 1145    No  results found for: TOTALPROTELP, ALBUMINELP, A1GS, A2GS, BETS, BETA2SER, GAMS, MSPIKE, SPEI  No results found for: KPAFRELGTCHN, LAMBDASER, KAPLAMBRATIO  Lab Results  Component Value Date   WBC 5.0 02/07/2018   NEUTROABS 2.8 01/17/2018   HGB 14.0 02/07/2018   HCT 42.7 02/07/2018   MCV 92.0 02/07/2018   PLT 165 02/07/2018  Lab Results  Component Value Date   CA2729 225.1 (H) 01/17/2018       Urinalysis No results found for: COLORURINE, APPEARANCEUR, LABSPEC, PHURINE, GLUCOSEU, HGBUR, BILIRUBINUR, KETONESUR, PROTEINUR, UROBILINOGEN, NITRITE, LEUKOCYTESUR   STUDIES:   Ct Chest W Contrast  Result Date: 02/05/2018 CLINICAL DATA:  Left breast cancer dx'd aug 2018. Pt received systemic hormone first. Now planning mastectomy and EXAM: CT CHEST WITH CONTRAST TECHNIQUE: Multidetector CT imaging of the chest was performed during intravenous contrast administration. CONTRAST:  22m OMNIPAQUE IOHEXOL 300 MG/ML  SOLN COMPARISON:  Chest CT 07/11/2017, bone scan 07/11/2017 FINDINGS: Cardiovascular: No significant vascular findings. Normal heart size. No pericardial effusion. Mediastinum/Nodes: RIGHT axial lymph node measures 15 x 17 mm compared with 26 x 19 mm. No new LEFT axillary adenopathy. LEFT central breast mass measures 43 x 48 mm compared with 50 x 48 mm. No supraclavicular adenopathy. No internal mammary adenopathy. No mediastinal adenopathy or hilar adenopathy. Lungs/Pleura: Small RIGHT lower lobe nodule measuring 4 mm (image 89/7) unchanged. Similar nodule in RIGHT lower lobe on image 94/7 measures 5 mm also unchanged. Two additional adjacent nodules in the LEFT lower lobe (image 80/7) are also unchanged Small LEFT lower lobe nodule measuring 3 mm unchanged on image 86/7. No new pulmonary nodules. Upper Abdomen: Lesion the posterior RIGHT hepatic lobe has simple fluid attenuation and not changed in size most consistent with benign cyst. No new hepatic lesions. No upper abdominal  adenopathy. Musculoskeletal: Lucent lesion in the posterior aspect of the T2 vertebral body measuring 15 mm. This lesion erodes the posterior wall of the vertebral body. IMPRESSION: 1. Decrease in size of LEFT breast mass and malignant LEFT axillary lymph node. 2. Approximately 4 pulmonary nodules in the RIGHT lower lobe are not changed in size. Lesions remain indeterminate but favored benign. 3. No evidence of intrathoracic nodal metastasis. 4. Unfortunately, lytic lesion in the posterior aspect of the T2 vertebral body is consistent with a SKELETAL METASTASIS. These results will be called to the ordering clinician or representative by the Radiologist Assistant, and communication documented in the PACS or zVision Dashboard. Electronically Signed   By: SSuzy BouchardM.D.   On: 02/05/2018 16:27    ELIGIBLE FOR AVAILABLE RESEARCH PROTOCOL: no  ASSESSMENT: 84y.o. [place] woman status post left breast overlapping sites biopsy 06/22/2017 for a clinical T3 N1-2, stage Glenn-Glenn invasive ductal carcinoma, grade 2, estrogen and progesterone receptor positive, HER-2 nonamplified, with an MIB-1 of 30%  (a) staging CT of the chest and bone survey and bone scan July 11, 2017 showed multiple indeterminant subcentimeter pulmonary nodules which will require follow-up, as well as an indeterminate right hepatic lobe lesion, but no definite evidence of malignancy.  (b) CA-27-29 is informative  (1) neo-adjuvant fulvestrant started on 07/05/2017, discontinued 12/20/2017 with possible progression  (2) definitive surgery 02/14/2018  (3) adjuvant radiation to follow surgery  (4) anastrozole started 01/17/2018  (a) consider palbociclib starting day  (5) METASTATIC DISEASE:   (a) CT scan of the chest 02/05/2018 shows a new lesion at T2  PLAN: JKaleyahhad some evidence of response to Faslodex, which she received for 6 months, but there may be a new lesion at T2.  There was also some concern regarding cost.   Accordingly we stopped the fulvestrant in February and started anastrozole in March.  She is tolerating the anastrozole well so far.  There is no need to discontinue it around the time of surgery since it does not affect clotting.  She will  meet with our oral chemotherapy pharmacist specialist today to learn more about palbociclib.  Once she has fully recovered from the surgery we will add that medication.  She may well have stage IV disease based on the recent CT scan results on T2.  On the other hand the long spots we had noted previously have not changed and are hopefully benign.  When I see her again on the 26 we will consider a total spinal MRI for better assessment of bone involvement, versus or in addition to a bone scan, and we will also consider a PET scan to further evaluate the lung spots.  At that point we will decide whether she would benefit from radiation to T2.  We may also want to biopsy that lesion.  Trevia is a good understanding of the overall plan.  She agrees with it.  She knows the goal of treatment in her case is control.  She will call with any problems that may develop before the next visit.  Jessica Glenn, Jessica Dad, MD  02/13/18 1:15 PM Medical Oncology and Hematology [facility] [address] [place], [place] [address] Tel. [phone]    Fax. [phone]    This document serves as a record of services personally performed by Lurline Del, MD. It was created on his behalf by Sheron Nightingale, a trained medical scribe. The creation of this record is based on the scribe's personal observations and the provider's statements to them.   I have reviewed the above documentation for accuracy and completeness, and I agree with the above.

## 2018-02-13 ENCOUNTER — Inpatient Hospital Stay: Payer: Medicare Other | Attending: Oncology

## 2018-02-13 ENCOUNTER — Inpatient Hospital Stay (HOSPITAL_BASED_OUTPATIENT_CLINIC_OR_DEPARTMENT_OTHER): Payer: Medicare Other | Admitting: Oncology

## 2018-02-13 ENCOUNTER — Telehealth: Payer: Self-pay | Admitting: Oncology

## 2018-02-13 ENCOUNTER — Telehealth: Payer: Self-pay | Admitting: Pharmacist

## 2018-02-13 ENCOUNTER — Inpatient Hospital Stay: Payer: Medicare Other

## 2018-02-13 VITALS — BP 177/75 | HR 66 | Temp 100.0°F | Resp 18 | Ht 66.0 in | Wt 151.4 lb

## 2018-02-13 DIAGNOSIS — C50912 Malignant neoplasm of unspecified site of left female breast: Secondary | ICD-10-CM

## 2018-02-13 DIAGNOSIS — Z170 Estrogen receptor positive status [ER+]: Secondary | ICD-10-CM

## 2018-02-13 DIAGNOSIS — C50412 Malignant neoplasm of upper-outer quadrant of left female breast: Secondary | ICD-10-CM

## 2018-02-13 DIAGNOSIS — C50812 Malignant neoplasm of overlapping sites of left female breast: Secondary | ICD-10-CM

## 2018-02-13 LAB — COMPREHENSIVE METABOLIC PANEL
ALBUMIN: 4.2 g/dL (ref 3.5–5.0)
ALT: 7 U/L (ref 0–55)
ANION GAP: 8 (ref 3–11)
AST: 16 U/L (ref 5–34)
Alkaline Phosphatase: 68 U/L (ref 40–150)
BILIRUBIN TOTAL: 0.4 mg/dL (ref 0.2–1.2)
BUN: 23 mg/dL (ref 7–26)
CHLORIDE: 107 mmol/L (ref 98–109)
CO2: 25 mmol/L (ref 22–29)
Calcium: 9.5 mg/dL (ref 8.4–10.4)
Creatinine, Ser: 0.84 mg/dL (ref 0.60–1.10)
GFR calc Af Amer: >60 mL/min (ref >60)
GFR calc non Af Amer: >60 mL/min (ref >60)
GLUCOSE: 107 mg/dL (ref 70–140)
POTASSIUM: 4.9 mmol/L (ref 3.5–5.1)
Sodium: 140 mmol/L (ref 136–145)
TOTAL PROTEIN: 7.2 g/dL (ref 6.4–8.3)

## 2018-02-13 MED ORDER — FULVESTRANT 250 MG/5ML IM SOLN
INTRAMUSCULAR | Status: AC
  Filled 2018-02-13: qty 5

## 2018-02-13 NOTE — Telephone Encounter (Signed)
Oral Chemotherapy Pharmacist Encounter   I spoke with patient and family members in exam room for overview of: Ibrance (palbociclib) for the treatment of hormone-receptor positive, Her-2 receptor negative breast cancer, in combination with anastrozole, planned duration until disease progression or unacceptable toxicity.   Counseled patient on administration, dosing, side effects, monitoring, drug-food interactions, safe handling, storage, and disposal.  No prescription for Leslee Home has been written. Patient scheduled for surgery tomorrow (02/14/2018). Patient will follow up with Dr. Jana Hakim on 02/23/2018. We will plan to follow-up with MD after that office visit for Ibrance start date.  Patient will take Ibrance 1107m capsules, 1 capsule by mouth once daily with lunch, for 3 weeks on, 1 week off.  Patient states that lunch is the most regular and consistent meal for her and plans to take her Ibrance with lunch on treatment days. We will ensure that prescription is written with these directions.  Patient knows to avoid grapefruit and grapefruit juice.  Patient is taking anastrozole once daily with breakfast.  Side effects include but not limited to: fatigue, hair loss, GI upset, nausea, decreased blood counts, and increased upper respiratory infections.  Reviewed with patient importance of keeping a medication schedule and plan for any missed doses.  Mr. and Mrs. back voiced understanding and appreciation.   All questions answered. Medication reconciliation performed and medication/allergy list updated.  We will follow-up with MD after office visit on 4/26 We plan on Ibrance being dispensed from the [facility] Confirmed with patient husband that they would like Ibrance shipped to their home from the pharmacy. We will follow-up with patient once prescription is written and at the pharmacy about the date they can expect Ibrance to be delivered.  Patient knows to  call the office with questions or concerns. Oral Oncology Clinic will continue to follow.  Thank you,  JJohny Drilling PharmD, BCPS, BCOP 02/13/2018   2:11 PM Oral Oncology Clinic [phone]

## 2018-02-13 NOTE — Telephone Encounter (Signed)
Gave patient AVS and calendar of upcoming April appointments.  °

## 2018-02-14 ENCOUNTER — Other Ambulatory Visit: Payer: Self-pay

## 2018-02-14 ENCOUNTER — Encounter (HOSPITAL_COMMUNITY)
Admission: RE | Disposition: A | Discharge status: Home / Self Care | Payer: Self-pay | Source: Ambulatory Visit | Attending: General Surgery

## 2018-02-14 ENCOUNTER — Encounter (HOSPITAL_COMMUNITY)
Admission: RE | Admit: 2018-02-14 | Discharge: 2018-02-14 | Disposition: A | Discharge status: Home / Self Care | Payer: Medicare Other | Source: Ambulatory Visit | Attending: General Surgery | Admitting: General Surgery

## 2018-02-14 ENCOUNTER — Ambulatory Visit (HOSPITAL_COMMUNITY): Payer: Medicare Other | Admitting: Certified Registered Nurse Anesthetist

## 2018-02-14 ENCOUNTER — Ambulatory Visit (HOSPITAL_COMMUNITY)
Admission: RE | Admit: 2018-02-14 | Discharge: 2018-02-15 | Disposition: A | Discharge status: Home / Self Care | Payer: Medicare Other | Source: Ambulatory Visit | Attending: General Surgery | Admitting: General Surgery

## 2018-02-14 ENCOUNTER — Encounter (HOSPITAL_COMMUNITY): Payer: Self-pay | Admitting: *Deleted

## 2018-02-14 DIAGNOSIS — Z79899 Other long term (current) drug therapy: Secondary | ICD-10-CM | POA: Diagnosis not present

## 2018-02-14 DIAGNOSIS — L821 Other seborrheic keratosis: Secondary | ICD-10-CM | POA: Insufficient documentation

## 2018-02-14 DIAGNOSIS — Z87891 Personal history of nicotine dependence: Secondary | ICD-10-CM | POA: Insufficient documentation

## 2018-02-14 DIAGNOSIS — C50412 Malignant neoplasm of upper-outer quadrant of left female breast: Secondary | ICD-10-CM | POA: Insufficient documentation

## 2018-02-14 DIAGNOSIS — Z170 Estrogen receptor positive status [ER+]: Secondary | ICD-10-CM | POA: Diagnosis not present

## 2018-02-14 DIAGNOSIS — C50112 Malignant neoplasm of central portion of left female breast: Secondary | ICD-10-CM

## 2018-02-14 DIAGNOSIS — C773 Secondary and unspecified malignant neoplasm of axilla and upper limb lymph nodes: Secondary | ICD-10-CM | POA: Diagnosis not present

## 2018-02-14 HISTORY — DX: Basal cell carcinoma of skin, unspecified: C44.91

## 2018-02-14 HISTORY — DX: Malignant neoplasm of unspecified site of left female breast: C50.912

## 2018-02-14 HISTORY — PX: MASTECTOMY W/ SENTINEL NODE BIOPSY: SHX2001

## 2018-02-14 HISTORY — PX: MASTECTOMY: SHX3

## 2018-02-14 LAB — CANCER ANTIGEN 27.29: CA 27.29: 222.5 U/mL — ABNORMAL HIGH (ref 0.0–38.6)

## 2018-02-14 SURGERY — MASTECTOMY WITH SENTINEL LYMPH NODE BIOPSY
Anesthesia: General | Site: Breast | Laterality: Left

## 2018-02-14 MED ORDER — LACTATED RINGERS IV SOLN
INTRAVENOUS | Status: DC | PRN
  Administered 2018-02-14 (×2): via INTRAVENOUS

## 2018-02-14 MED ORDER — GABAPENTIN 300 MG PO CAPS
300.0000 mg | ORAL_CAPSULE | ORAL | Status: AC
  Administered 2018-02-14: 300 mg via ORAL
  Filled 2018-02-14: qty 1

## 2018-02-14 MED ORDER — FENTANYL CITRATE (PF) 100 MCG/2ML IJ SOLN
25.0000 ug | INTRAMUSCULAR | Status: DC | PRN
  Administered 2018-02-14: 25 ug via INTRAVENOUS

## 2018-02-14 MED ORDER — ANASTROZOLE 1 MG PO TABS
1.0000 mg | ORAL_TABLET | Freq: Every day | ORAL | Status: DC
  Administered 2018-02-14 – 2018-02-15 (×2): 1 mg via ORAL
  Filled 2018-02-14 (×2): qty 1

## 2018-02-14 MED ORDER — FENTANYL CITRATE (PF) 100 MCG/2ML IJ SOLN
INTRAMUSCULAR | Status: AC
  Filled 2018-02-14: qty 2

## 2018-02-14 MED ORDER — HYDROCODONE-ACETAMINOPHEN 5-325 MG PO TABS
1.0000 | ORAL_TABLET | ORAL | Status: DC | PRN
  Administered 2018-02-14: 1 via ORAL
  Administered 2018-02-14 – 2018-02-15 (×2): 2 via ORAL
  Administered 2018-02-15: 1 via ORAL
  Filled 2018-02-14: qty 2
  Filled 2018-02-14 (×2): qty 1
  Filled 2018-02-14: qty 2

## 2018-02-14 MED ORDER — CELECOXIB 200 MG PO CAPS
200.0000 mg | ORAL_CAPSULE | ORAL | Status: AC
  Administered 2018-02-14: 200 mg via ORAL
  Filled 2018-02-14: qty 1

## 2018-02-14 MED ORDER — 0.9 % SODIUM CHLORIDE (POUR BTL) OPTIME
TOPICAL | Status: DC | PRN
  Administered 2018-02-14: 1000 mL

## 2018-02-14 MED ORDER — FENTANYL CITRATE (PF) 250 MCG/5ML IJ SOLN
INTRAMUSCULAR | Status: AC
  Filled 2018-02-14: qty 5

## 2018-02-14 MED ORDER — PROPOFOL 10 MG/ML IV BOLUS
INTRAVENOUS | Status: AC
  Filled 2018-02-14: qty 20

## 2018-02-14 MED ORDER — LIDOCAINE 2% (20 MG/ML) 5 ML SYRINGE
INTRAMUSCULAR | Status: AC
  Filled 2018-02-14: qty 5

## 2018-02-14 MED ORDER — KCL IN DEXTROSE-NACL 20-5-0.9 MEQ/L-%-% IV SOLN
INTRAVENOUS | Status: DC
  Administered 2018-02-14: 17:00:00 via INTRAVENOUS
  Filled 2018-02-14 (×2): qty 1000

## 2018-02-14 MED ORDER — CHLORHEXIDINE GLUCONATE CLOTH 2 % EX PADS: 6.0000 | MEDICATED_PAD | Freq: Once | CUTANEOUS | Status: DC

## 2018-02-14 MED ORDER — OXYCODONE HCL 5 MG PO TABS: 5.0000 mg | ORAL_TABLET | Freq: Once | ORAL | Status: DC | PRN

## 2018-02-14 MED ORDER — ONDANSETRON 4 MG PO TBDP: 4.0000 mg | ORAL_TABLET | Freq: Four times a day (QID) | ORAL | Status: DC | PRN

## 2018-02-14 MED ORDER — METHOCARBAMOL 500 MG PO TABS
500.0000 mg | ORAL_TABLET | Freq: Four times a day (QID) | ORAL | Status: DC | PRN
  Administered 2018-02-14: 500 mg via ORAL
  Filled 2018-02-14: qty 1

## 2018-02-14 MED ORDER — HEPARIN SODIUM (PORCINE) 5000 UNIT/ML IJ SOLN
5000.0000 [IU] | Freq: Three times a day (TID) | INTRAMUSCULAR | Status: DC
  Administered 2018-02-15: 5000 [IU] via SUBCUTANEOUS
  Filled 2018-02-14: qty 1

## 2018-02-14 MED ORDER — DEXAMETHASONE SODIUM PHOSPHATE 10 MG/ML IJ SOLN
INTRAMUSCULAR | Status: DC | PRN
  Administered 2018-02-14: 10 mg via INTRAVENOUS

## 2018-02-14 MED ORDER — ONDANSETRON HCL 4 MG/2ML IJ SOLN: 4.0000 mg | Freq: Four times a day (QID) | INTRAMUSCULAR | Status: DC | PRN

## 2018-02-14 MED ORDER — CEFAZOLIN SODIUM-DEXTROSE 2-4 GM/100ML-% IV SOLN
2.0000 g | INTRAVENOUS | Status: AC
  Administered 2018-02-14: 2 g via INTRAVENOUS
  Filled 2018-02-14: qty 100

## 2018-02-14 MED ORDER — DEXAMETHASONE SODIUM PHOSPHATE 10 MG/ML IJ SOLN
INTRAMUSCULAR | Status: AC
  Filled 2018-02-14: qty 1

## 2018-02-14 MED ORDER — OXYCODONE HCL 5 MG/5ML PO SOLN: 5.0000 mg | Freq: Once | ORAL | Status: DC | PRN

## 2018-02-14 MED ORDER — EPHEDRINE SULFATE-NACL 50-0.9 MG/10ML-% IV SOSY
PREFILLED_SYRINGE | INTRAVENOUS | Status: DC | PRN
Start: 2018-02-14 — End: 2018-02-14
  Administered 2018-02-14: 15 mg via INTRAVENOUS
  Administered 2018-02-14 (×2): 10 mg via INTRAVENOUS

## 2018-02-14 MED ORDER — ONDANSETRON HCL 4 MG/2ML IJ SOLN
INTRAMUSCULAR | Status: DC | PRN
  Administered 2018-02-14: 4 mg via INTRAVENOUS

## 2018-02-14 MED ORDER — PANTOPRAZOLE SODIUM 20 MG PO TBEC
20.0000 mg | DELAYED_RELEASE_TABLET | Freq: Every day | ORAL | Status: DC
  Administered 2018-02-14 – 2018-02-15 (×2): 20 mg via ORAL
  Filled 2018-02-14 (×2): qty 1

## 2018-02-14 MED ORDER — PROPOFOL 10 MG/ML IV BOLUS
INTRAVENOUS | Status: DC | PRN
  Administered 2018-02-14: 140 mg via INTRAVENOUS

## 2018-02-14 MED ORDER — ACETAMINOPHEN 500 MG PO TABS
1000.0000 mg | ORAL_TABLET | ORAL | Status: AC
  Administered 2018-02-14: 1000 mg via ORAL
  Filled 2018-02-14: qty 2

## 2018-02-14 MED ORDER — METHYLENE BLUE 0.5 % INJ SOLN
INTRAVENOUS | Status: AC
  Filled 2018-02-14: qty 10

## 2018-02-14 MED ORDER — TECHNETIUM TC 99M SULFUR COLLOID FILTERED
1.0000 | Freq: Once | INTRAVENOUS | Status: AC | PRN
  Administered 2018-02-14: 1 via INTRADERMAL

## 2018-02-14 MED ORDER — FENTANYL CITRATE (PF) 100 MCG/2ML IJ SOLN
INTRAMUSCULAR | Status: DC | PRN
  Administered 2018-02-14 (×2): 25 ug via INTRAVENOUS
  Administered 2018-02-14: 50 ug via INTRAVENOUS
  Administered 2018-02-14 (×2): 25 ug via INTRAVENOUS

## 2018-02-14 MED ORDER — LIDOCAINE 2% (20 MG/ML) 5 ML SYRINGE
INTRAMUSCULAR | Status: DC | PRN
  Administered 2018-02-14: 60 mg via INTRAVENOUS

## 2018-02-14 MED ORDER — MORPHINE SULFATE (PF) 4 MG/ML IV SOLN: 1.0000 mg | INTRAVENOUS | Status: DC | PRN

## 2018-02-14 MED ORDER — VITAMIN C 500 MG PO TABS: 1000.0000 mg | ORAL_TABLET | Freq: Every day | ORAL | Status: DC

## 2018-02-14 MED ORDER — ONDANSETRON HCL 4 MG/2ML IJ SOLN: 4.0000 mg | Freq: Once | INTRAMUSCULAR | Status: DC | PRN

## 2018-02-14 MED ORDER — ONDANSETRON HCL 4 MG/2ML IJ SOLN
INTRAMUSCULAR | Status: AC
  Filled 2018-02-14: qty 2

## 2018-02-14 SURGICAL SUPPLY — 63 items
APPLIER CLIP 9.375 MED OPEN (MISCELLANEOUS) ×3
BINDER BREAST LRG (GAUZE/BANDAGES/DRESSINGS) IMPLANT
BINDER BREAST XLRG (GAUZE/BANDAGES/DRESSINGS) IMPLANT
BIOPATCH RED 1 DISK 7.0 (GAUZE/BANDAGES/DRESSINGS) IMPLANT
BIOPATCH RED 1IN DISK 7.0MM (GAUZE/BANDAGES/DRESSINGS)
BIOPATCH WHT 1IN DISK W/4.0 H (GAUZE/BANDAGES/DRESSINGS) ×3 IMPLANT
CANISTER SUCT 3000ML PPV (MISCELLANEOUS) ×3 IMPLANT
CHLORAPREP W/TINT 26ML (MISCELLANEOUS) ×3 IMPLANT
CLIP APPLIE 9.375 MED OPEN (MISCELLANEOUS) ×1 IMPLANT
CONT SPEC 4OZ CLIKSEAL STRL BL (MISCELLANEOUS) ×3 IMPLANT
COVER PROBE W GEL 5X96 (DRAPES) ×3 IMPLANT
COVER SURGICAL LIGHT HANDLE (MISCELLANEOUS) ×3 IMPLANT
DERMABOND ADVANCED (GAUZE/BANDAGES/DRESSINGS) ×4
DERMABOND ADVANCED .7 DNX12 (GAUZE/BANDAGES/DRESSINGS) ×2 IMPLANT
DEVICE DISSECT PLASMABLAD 3.0S (MISCELLANEOUS) ×1 IMPLANT
DRAIN CHANNEL 19F RND (DRAIN) ×3 IMPLANT
DRAPE CHEST BREAST 15X10 FENES (DRAPES) ×3 IMPLANT
DRSG PAD ABDOMINAL 8X10 ST (GAUZE/BANDAGES/DRESSINGS) IMPLANT
DRSG TEGADERM 4X4.75 (GAUZE/BANDAGES/DRESSINGS) ×3 IMPLANT
ELECT CAUTERY BLADE 6.4 (BLADE) ×3 IMPLANT
ELECT REM PT RETURN 9FT ADLT (ELECTROSURGICAL) ×3
ELECTRODE REM PT RTRN 9FT ADLT (ELECTROSURGICAL) ×1 IMPLANT
EVACUATOR SILICONE 100CC (DRAIN) ×3 IMPLANT
GAUZE SPONGE 4X4 12PLY STRL (GAUZE/BANDAGES/DRESSINGS) ×3 IMPLANT
GLOVE BIO SURGEON STRL SZ 6.5 (GLOVE) ×2 IMPLANT
GLOVE BIO SURGEON STRL SZ7 (GLOVE) ×3 IMPLANT
GLOVE BIO SURGEON STRL SZ7.5 (GLOVE) ×3 IMPLANT
GLOVE BIO SURGEON STRL SZ8 (GLOVE) ×3 IMPLANT
GLOVE BIO SURGEONS STRL SZ 6.5 (GLOVE) ×1
GLOVE BIOGEL PI IND STRL 7.0 (GLOVE) ×3 IMPLANT
GLOVE BIOGEL PI IND STRL 8 (GLOVE) ×1 IMPLANT
GLOVE BIOGEL PI INDICATOR 7.0 (GLOVE) ×6
GLOVE BIOGEL PI INDICATOR 8 (GLOVE) ×2
GOWN STRL REUS W/ TWL LRG LVL3 (GOWN DISPOSABLE) ×3 IMPLANT
GOWN STRL REUS W/ TWL XL LVL3 (GOWN DISPOSABLE) ×1 IMPLANT
GOWN STRL REUS W/TWL LRG LVL3 (GOWN DISPOSABLE) ×6
GOWN STRL REUS W/TWL XL LVL3 (GOWN DISPOSABLE) ×2
KIT BASIN OR (CUSTOM PROCEDURE TRAY) ×3 IMPLANT
KIT TURNOVER KIT B (KITS) ×3 IMPLANT
LIGHT WAVEGUIDE WIDE FLAT (MISCELLANEOUS) IMPLANT
NEEDLE 18GX1X1/2 (RX/OR ONLY) (NEEDLE) IMPLANT
NEEDLE FILTER BLUNT 18X 1/2SAF (NEEDLE)
NEEDLE FILTER BLUNT 18X1 1/2 (NEEDLE) IMPLANT
NEEDLE HYPO 25GX1X1/2 BEV (NEEDLE) IMPLANT
NS IRRIG 1000ML POUR BTL (IV SOLUTION) ×3 IMPLANT
PACK GENERAL/GYN (CUSTOM PROCEDURE TRAY) ×3 IMPLANT
PAD ABD 8X10 STRL (GAUZE/BANDAGES/DRESSINGS) ×6 IMPLANT
PAD ARMBOARD 7.5X6 YLW CONV (MISCELLANEOUS) ×3 IMPLANT
PLASMABLADE 3.0S (MISCELLANEOUS) ×3
SPECIMEN JAR X LARGE (MISCELLANEOUS) ×3 IMPLANT
SUT ETHILON 3 0 FSL (SUTURE) IMPLANT
SUT MNCRL AB 4-0 PS2 18 (SUTURE) ×3 IMPLANT
SUT SILK 2 0 SH (SUTURE) ×3 IMPLANT
SUT VIC AB 0 CT1 27 (SUTURE) ×4
SUT VIC AB 0 CT1 27XBRD ANBCTR (SUTURE) ×2 IMPLANT
SUT VIC AB 3-0 54X BRD REEL (SUTURE) IMPLANT
SUT VIC AB 3-0 BRD 54 (SUTURE)
SUT VIC AB 3-0 SH 18 (SUTURE) ×3 IMPLANT
SYR CONTROL 10ML LL (SYRINGE) IMPLANT
TOWEL OR 17X24 6PK STRL BLUE (TOWEL DISPOSABLE) ×3 IMPLANT
TOWEL OR 17X26 10 PK STRL BLUE (TOWEL DISPOSABLE) ×3 IMPLANT
TUBE CONNECTING 12'X1/4 (SUCTIONS) ×1
TUBE CONNECTING 12X1/4 (SUCTIONS) ×2 IMPLANT

## 2018-02-14 NOTE — Op Note (Signed)
02/14/2018  10:57 AM  PATIENT:  Jessica Glenn  84 y.o. female  PRE-OPERATIVE DIAGNOSIS:  left breast cancer  POST-OPERATIVE DIAGNOSIS:  left breast cancer  PROCEDURE:  Procedure(s): LEFT MASTECTOMY WITH DEEP LEFT AXILLARY SENTINEL LYMPH NODE BIOPSY   SURGEON:  Surgeon(s) and Role:    * Jovita Kussmaul, MD - Primary    * Gosai, Puja, PA-C - Assisting  PHYSICIAN ASSISTANT:   ASSISTANTS: Carlena Hurl, PA   ANESTHESIA:   general  EBL:  30 mL   BLOOD ADMINISTERED:none  DRAINS: (1) Jackson-Pratt drain(s) with closed bulb suction in the prepectoral space   LOCAL MEDICATIONS USED:  NONE  SPECIMEN:  Source of Specimen:  left mastectomy and sentinel nodes X 5  DISPOSITION OF SPECIMEN:  PATHOLOGY  COUNTS:  YES  TOURNIQUET:  * No tourniquets in log *  DICTATION: .Dragon Dictation   After informed consent was obtained the patient was brought to the operating room and placed in the supine position on the operating table.  After adequate induction of general anesthesia the patient's left chest, breast, and axillary area were prepped with ChloraPrep, allowed to dry, and draped in usual sterile manner.  An appropriate timeout was performed.  Earlier in the day the patient underwent injection 1 mCi of technetium sulfur colloid in the subareolar position on the left.  The neoprobe was set to technetium in an area of radioactivity was readily identified in the left axilla.  Next an elliptical incision was made around the nipple and areole a complex with a 10 blade knife in order to get around the palpable tumor and minimize the excess skin.  The incision was carried through the skin and subcutaneous tissue sharply with the plasma blade.  Breast hooks were used to elevate the skin flaps anteriorly towards the saline.  Thin skin flaps were then created circumferentially between the breast tissue in the subcutaneous fat using the plasma blade.  This dissection was carried all the way to the chest wall  circumferentially.  Next the breast was removed from the pectoralis muscle with the pectoralis fascia.  Once the breast was removed it was oriented with a stitch on the lateral skin and sent to pathology for further evaluation.  The neoprobe was used to identify the hot spot in the left axilla.  I was able to identify 5 lymph nodes with increased radioactivity.  These were excised sharply with the plasma blade.  Ex vivo counts on these nodes were between 200 and 1000.  These were sent as sentinel nodes numbers 1 through 5.  No other hot or palpable lymph nodes were identified in the left axilla.  Several small vessels and intercostal brachial nerves were controlled with clips.  The wound was irrigated with copious amounts of saline.  Hemostasis was achieved using the plasma blade.  The posterior axillary tissue was then anchored to the serratus muscle the chest wall with a running 0 Vicryl stitch.  The subcutaneous tissue was then tacked to the chest wall at the lateral edge of the pectoralis muscle also with an interrupted 0 Vicryl stitch.  A small stab incision was made near the anterior axillary line with a 15 blade knife.  A tonsil clamp was placed through this opening and used to bring a 19 [place] round Blake drain into the operative bed.  The drain was placed along the chest wall and the end of the drain was placed in the axilla.  The drain was anchored to the skin with  a 3-0 nylon stitch.  Next the superior and inferior skin flaps were grossly reapproximated with interrupted 3-0 Vicryl stitches.  The skin was then closed with a running 4-0 Monocryl subcuticular stitch.  Dermabond dressings were applied.  The patient tolerated the procedure well.  At the end of the case all needle sponge and instrument counts were correct.  The drain was placed to bulb suction and there was a good seal.  The patient was then awakened and taken to recovery in stable condition.  PLAN OF CARE: Admit for overnight  observation  PATIENT DISPOSITION:  PACU - hemodynamically stable.   Delay start of Pharmacological VTE agent (>24hrs) due to surgical blood loss or risk of bleeding: no

## 2018-02-14 NOTE — Anesthesia Preprocedure Evaluation (Signed)
Anesthesia Evaluation  Patient identified by MRN, date of birth, ID band Patient awake    Reviewed: Allergy & Precautions, NPO status , Patient's Chart, lab work & pertinent test results  Airway Mallampati: II  TM Distance: >3 FB Neck ROM: Full    Dental  (+) Teeth Intact, Dental Advisory Given   Pulmonary former smoker,    breath sounds clear to auscultation       Cardiovascular  Rhythm:Regular Rate:Normal     Neuro/Psych    GI/Hepatic   Endo/Other    Renal/GU      Musculoskeletal   Abdominal   Peds  Hematology   Anesthesia Other Findings   Reproductive/Obstetrics                             Anesthesia Physical Anesthesia Plan  ASA: III  Anesthesia Plan: General   Post-op Pain Management:    Induction: Intravenous  PONV Risk Score and Plan: Ondansetron and Dexamethasone  Airway Management Planned: LMA  Additional Equipment:   Intra-op Plan:   Post-operative Plan:   Informed Consent: I have reviewed the patients History and Physical, chart, labs and discussed the procedure including the risks, benefits and alternatives for the proposed anesthesia with the patient or authorized representative who has indicated his/her understanding and acceptance.   Dental advisory given  Plan Discussed with: CRNA and Anesthesiologist  Anesthesia Plan Comments:         Anesthesia Quick Evaluation  

## 2018-02-14 NOTE — H&P (Signed)
Jessica Glenn  Location: [facility] Patient #: [id number] DOB: 1929/04/28 Married / Language: English / Race: White Female   History of Present Illness  The patient is a 84 year old female who presents for a follow-up for Breast cancer. The patient is on 84 year old white female who has a known locally advanced left breast cancer. It measured approximate 6 cm initially. It was ER and PR positive and HER-2 negative with a cast 67 of 30%. It was a lobular breast cancer. She was treated with neoadjuvant hormones and has had no real response. She now presents for definitive surgery.   Allergies  No Known Allergies  Allergies Reconciled   Medication History  Anastrozole (1MG Tablet, Oral) Active. B Complex 1 (Oral) Specific strength unknown - Active. Multivitamin Adult (Oral) Active. Vitamin C (1000MG Tablet, Oral) Active. Red Yeast Rice (600MG Tablet, Oral) Active. Zinc Gluconate (50MG Tablet, Oral) Active. Vitamin D (2000UNIT Tablet, Oral) Active. Medications Reconciled    Review of Systems General Not Present- Appetite Loss, Chills, Fatigue, Fever, Night Sweats, Weight Gain and Weight Loss. Skin Not Present- Change in Wart/Mole, Dryness, Hives, Jaundice, New Lesions, Non-Healing Wounds, Rash and Ulcer. HEENT Not Present- Earache, Hearing Loss, Hoarseness, Nose Bleed, Oral Ulcers, Ringing in the Ears, Seasonal Allergies, Sinus Pain, Sore Throat, Visual Disturbances, Wears glasses/contact lenses and Yellow Eyes. Respiratory Not Present- Bloody sputum, Chronic Cough, Difficulty Breathing, Snoring and Wheezing. Breast Present- Breast Mass. Not Present- Breast Pain, Nipple Discharge and Skin Changes. Cardiovascular Not Present- Chest Pain, Difficulty Breathing Lying Down, Leg Cramps, Palpitations, Rapid Heart Rate, Shortness of Breath and Swelling of Extremities. Gastrointestinal Not Present- Abdominal Pain, Bloating, Bloody Stool, Change in Bowel Habits, Chronic  diarrhea, Constipation, Difficulty Swallowing, Excessive gas, Gets full quickly at meals, Hemorrhoids, Indigestion, Nausea, Rectal Pain and Vomiting. Female Genitourinary Not Present- Frequency, Nocturia, Painful Urination, Pelvic Pain and Urgency. Musculoskeletal Not Present- Back Pain, Joint Pain, Joint Stiffness, Muscle Pain, Muscle Weakness and Swelling of Extremities. Neurological Not Present- Decreased Memory, Fainting, Headaches, Numbness, Seizures, Tingling, Tremor, Trouble walking and Weakness. Psychiatric Not Present- Anxiety, Bipolar, Change in Sleep Pattern, Depression, Fearful and Frequent crying. Endocrine Not Present- Cold Intolerance, Excessive Hunger, Hair Changes, Heat Intolerance, Hot flashes and New Diabetes. Hematology Not Present- Blood Thinners, Easy Bruising, Excessive bleeding, Gland problems, HIV and Persistent Infections.  Vitals  Weight: 152.13 lb Height: 65in Body Surface Area: 1.76 m Body Mass Index: 25.31 kg/m  Temp.: 98.73F(Oral)  Pulse: 81 (Regular)  BP: 156/100 (Sitting, Left Arm, Standard)       Physical Exam  General Mental Status-Alert. General Appearance-Consistent with stated age. Hydration-Well hydrated. Voice-Normal.  Head and Neck Head-normocephalic, atraumatic with no lesions or palpable masses. Trachea-midline. Thyroid Gland Characteristics - normal size and consistency.  Eye Eyeball - Bilateral-Extraocular movements intact. Sclera/Conjunctiva - Bilateral-No scleral icterus.  Chest and Lung Exam Chest and lung exam reveals -quiet, even and easy respiratory effort with no use of accessory muscles and on auscultation, normal breath sounds, no adventitious sounds and normal vocal resonance. Inspection Chest Wall - Normal. Back - normal.  Breast Note: There is a large palpable 7 cm mass in the central left breast. There is a palpable enlarged lymph node in the left axilla. There is no palpable mass  in the right breast. There is no palpable supraclavicular or cervical lymphadenopathy. This mass does not appear to be fixed to the chest wall. It does appear to be very close to the skin.   Cardiovascular Cardiovascular examination reveals -  normal heart sounds, regular rate and rhythm with no murmurs and normal pedal pulses bilaterally.  Abdomen Inspection Inspection of the abdomen reveals - No Hernias. Skin - Scar - no surgical scars. Palpation/Percussion Palpation and Percussion of the abdomen reveal - Soft, Non Tender, No Rebound tenderness, No Rigidity (guarding) and No hepatosplenomegaly. Auscultation Auscultation of the abdomen reveals - Bowel sounds normal.  Neurologic Neurologic evaluation reveals -alert and oriented x 3 with no impairment of recent or remote memory. Mental Status-Normal.  Musculoskeletal Normal Exam - Left-Upper Extremity Strength Normal and Lower Extremity Strength Normal. Normal Exam - Right-Upper Extremity Strength Normal and Lower Extremity Strength Normal.  Lymphatic Head & Neck  General Head & Neck Lymphatics: Bilateral - Description - Normal. Axillary  General Axillary Region: Bilateral - Description - Normal. Tenderness - Non Tender. Femoral & Inguinal  Generalized Femoral & Inguinal Lymphatics: Bilateral - Description - Normal. Tenderness - Non Tender.    Assessment & Plan  MALIGNANT NEOPLASM OF CENTRAL PORTION OF LEFT FEMALE BREAST, UNSPECIFIED ESTROGEN RECEPTOR STATUS (C50.112) Impression: The patient appears to have a locally advanced left breast cancer that has not really responded to antiestrogen therapy. At this point she will need a mastectomy to remove the cancer from the left breast. Since she only has 1 abnormal appearing lymph node and because of her age we will try to node map her but if she does not map very well then she will need a complete node dissection at the time of the surgery. I have discussed with her in  detail the risks and benefits of the operation as well as some of the technical aspects and she understands and wishes to proceed.

## 2018-02-14 NOTE — Interval H&P Note (Signed)
History and Physical Interval Note:  02/14/2018 8:32 AM  Jessica Glenn  has presented today for surgery, with the diagnosis of left breast cancer  The various methods of treatment have been discussed with the patient and family. After consideration of risks, benefits and other options for treatment, the patient has consented to  Procedure(s): LEFT MASTECTOMY WITH SENTINEL LYMPH NODE BIOPSY AND POSSIBLE NODE DISSECTION (Left) as a surgical intervention .  The patient's history has been reviewed, patient examined, no change in status, stable for surgery.  I have reviewed the patient's chart and labs.  Questions were answered to the patient's satisfaction.     TOTH III,Viviano Bir S

## 2018-02-14 NOTE — Anesthesia Procedure Notes (Signed)
Anesthesia Regional Block: Pectoralis block   Pre-Anesthetic Checklist: ,, timeout performed, Correct Patient, Correct Site, Correct Laterality, Correct Procedure, Correct Position, site marked, Risks and benefits discussed,  Surgical consent,  Pre-op evaluation,  At surgeon's request and post-op pain management  Laterality: Left  Prep: chloraprep       Needles:  Injection technique: Single-shot  Needle Type: Echogenic Needle     Needle Length: 9cm  Needle Gauge: 21     Additional Needles:   Narrative:  Start time: 02/14/2018 8:10 AM End time: 02/14/2018 8:15 AM Injection made incrementally with aspirations every 5 mL.  Performed by: Personally  Anesthesiologist: Roberts Gaudy, MD  Additional Notes: 20 cc 0.75% Ropivacaine with 0.2 mg clonidine

## 2018-02-14 NOTE — Progress Notes (Signed)
Pt received from PACU. A&O x4, complaining of pain 5/10. Dressing and binder dry and intact

## 2018-02-14 NOTE — Anesthesia Procedure Notes (Signed)
Procedure Name: LMA Insertion Date/Time: 02/14/2018 8:54 AM Performed by: Genelle Bal, CRNA Pre-anesthesia Checklist: Patient identified, Emergency Drugs available, Suction available and Patient being monitored Patient Re-evaluated:Patient Re-evaluated prior to induction Oxygen Delivery Method: Circle system utilized Preoxygenation: Pre-oxygenation with 100% oxygen Induction Type: IV induction Ventilation: Mask ventilation without difficulty LMA: LMA inserted LMA Size: 4.0 Number of attempts: 1 Airway Equipment and Method: Bite block Placement Confirmation: positive ETCO2 Tube secured with: Tape Dental Injury: Teeth and Oropharynx as per pre-operative assessment

## 2018-02-14 NOTE — Transfer of Care (Signed)
Immediate Anesthesia Transfer of Care Note  Patient: Jessica Glenn  Procedure(s) Performed: LEFT MASTECTOMY WITH SENTINEL LYMPH NODE BIOPSY AND POSSIBLE NODE DISSECTION (Left Breast)  Patient Location: PACU  Anesthesia Type:GA combined with regional for post-op pain  Level of Consciousness: awake, alert  and oriented  Airway & Oxygen Therapy: Patient Spontanous Breathing and Patient connected to face mask oxygen  Post-op Assessment: Report given to RN and Post -op Vital signs reviewed and stable  Post vital signs: Reviewed and stable  Last Vitals:  Vitals Value Taken Time  BP 126/57 02/14/2018 11:07 AM  Temp    Pulse 78 02/14/2018 11:08 AM  Resp 15 02/14/2018 11:08 AM  SpO2 94 % 02/14/2018 11:08 AM  Vitals shown include unvalidated device data.  Last Pain:  Vitals:   02/14/18 2951  TempSrc: Oral         Complications: No apparent anesthesia complications

## 2018-02-14 NOTE — Anesthesia Postprocedure Evaluation (Signed)
Anesthesia Post Note  Patient: Jessica Glenn  Procedure(s) Performed: LEFT MASTECTOMY WITH SENTINEL LYMPH NODE BIOPSY AND POSSIBLE NODE DISSECTION (Left Breast)     Patient location during evaluation: PACU Anesthesia Type: General Level of consciousness: awake and alert Pain management: pain level controlled Vital Signs Assessment: post-procedure vital signs reviewed and stable Respiratory status: spontaneous breathing, nonlabored ventilation, respiratory function stable and patient connected to nasal cannula oxygen Cardiovascular status: blood pressure returned to baseline and stable Postop Assessment: no apparent nausea or vomiting Anesthetic complications: no    Last Vitals:  Vitals:   02/14/18 1145 02/14/18 1200  BP:    Pulse: 75 74  Resp: 18 17  Temp:    SpO2: 97% 99%    Last Pain:  Vitals:   02/14/18 1200  TempSrc:   PainSc: 7                  Carlina Derks COKER

## 2018-02-15 ENCOUNTER — Encounter (HOSPITAL_COMMUNITY): Payer: Self-pay | Admitting: General Surgery

## 2018-02-15 DIAGNOSIS — C50412 Malignant neoplasm of upper-outer quadrant of left female breast: Secondary | ICD-10-CM | POA: Diagnosis not present

## 2018-02-15 MED ORDER — HYDROCODONE-ACETAMINOPHEN 5-325 MG PO TABS: 1.0000 | ORAL_TABLET | Freq: Four times a day (QID) | ORAL | 0 refills | Status: DC | PRN

## 2018-02-15 NOTE — Discharge Summary (Signed)
Physician Discharge Summary  Patient ID: Jessica Glenn MRN: [id number] DOB/AGE: 12-22-1928 84 y.o.  Admit date: 02/14/2018 Discharge date: 02/15/2018  Admission Diagnoses:  Discharge Diagnoses:  Active Problems:   Breast cancer of upper-outer quadrant of left female breast [facility])   Discharged Condition: good  Hospital Course: the patient underwent left mastectomy and sentinel node mapping. She tolerated surgery well. On pod 1 she was ready for discharge home  Consults: None  Significant Diagnostic Studies: none  Treatments: surgery: as above  Discharge Exam: Blood pressure (!) 115/44, pulse 75, temperature 97.8 F (36.6 C), temperature source Oral, resp. rate 16, height 5\' 5"  (1.651 m), weight 71.3 kg (157 lb 3 oz), SpO2 100 %. General appearance: alert and cooperative Resp: clear to auscultation bilaterally Chest wall: skin flaps look good Cardio: regular rate and rhythm GI: soft, non-tender; bowel sounds normal; no masses,  no organomegaly  Disposition: Discharge disposition: 01-Home or Self Care       Discharge Instructions    Call MD for:  difficulty breathing, headache or visual disturbances   Complete by:  As directed    Call MD for:  extreme fatigue   Complete by:  As directed    Call MD for:  hives   Complete by:  As directed    Call MD for:  persistant dizziness or light-headedness   Complete by:  As directed    Call MD for:  persistant nausea and vomiting   Complete by:  As directed    Call MD for:  redness, tenderness, or signs of infection (pain, swelling, redness, odor or green/yellow discharge around incision site)   Complete by:  As directed    Call MD for:  severe uncontrolled pain   Complete by:  As directed    Call MD for:  temperature >100.4   Complete by:  As directed    Diet - low sodium heart healthy   Complete by:  As directed    Discharge instructions   Complete by:  As directed    Sponge bathe while drain is in. No overhead activity.  Empty drain twice a day, record output, recharge bulb. Diet as tolerated   Increase activity slowly   Complete by:  As directed    No wound care   Complete by:  As directed         Signed: TOTH III,Milbern Doescher S 02/15/2018, 11:01 AM

## 2018-02-15 NOTE — Progress Notes (Signed)
1 Day Post-Op   Subjective/Chief Complaint: No complaints   Objective: Vital signs in last 24 hours: Temp:  [97.2 F (36.2 C)-98.3 F (36.8 C)] 97.8 F (36.6 C) (04/18 0958) Pulse Rate:  [64-79] 75 (04/18 0958) Resp:  [9-20] 16 (04/18 0958) BP: (92-139)/(44-91) 115/44 (04/18 0958) SpO2:  [94 %-100 %] 100 % (04/18 0958) Weight:  [71.3 kg (157 lb 3 oz)] 71.3 kg (157 lb 3 oz) (04/17 1549) Last BM Date: 02/13/18  Intake/Output from previous day: 04/17 0701 - 04/18 0700 In: 1829.2 [P.O.:580; I.V.:1249.2] Out: 335 [Urine:150; Drains:155; Blood:30] Intake/Output this shift: No intake/output data recorded.  General appearance: alert and cooperative Resp: clear to auscultation bilaterally Chest wall: skin flaps look good Cardio: regular rate and rhythm GI: soft, non-tender; bowel sounds normal; no masses,  no organomegaly  Lab Results:  No results for input(s): WBC, HGB, HCT, PLT in the last 72 hours. BMET Recent Labs    02/13/18 1145  NA 140  K 4.9  CL 107  CO2 25  GLUCOSE 107  BUN 23  CREATININE 0.84  CALCIUM 9.5   PT/INR No results for input(s): LABPROT, INR in the last 72 hours. ABG No results for input(s): PHART, HCO3 in the last 72 hours.  Invalid input(s): PCO2, PO2  Studies/Results: Nm Sentinel Node Inj-no Rpt (breast)  Result Date: 02/14/2018 Sulfur colloid was injected by the nuclear medicine technologist for melanoma sentinel node.    Anti-infectives: Anti-infectives (From admission, onward)   Start     Dose/Rate Route Frequency Ordered Stop   02/14/18 0542  ceFAZolin (ANCEF) IVPB 2g/100 mL premix     2 g 200 mL/hr over 30 Minutes Intravenous On call to O.R. 02/14/18 0542 02/14/18 0915      Assessment/Plan: s/p Procedure(s): LEFT MASTECTOMY WITH SENTINEL LYMPH NODE BIOPSY AND POSSIBLE NODE DISSECTION (Left) Advance diet Discharge  LOS: 0 days    TOTH III,PAUL S 02/15/2018

## 2018-02-15 NOTE — Progress Notes (Signed)
Discharged home with husband via wheelchair, discharged instructions, personal belongings, prescription given to patient . No further questions asked

## 2018-02-22 NOTE — Progress Notes (Signed)
[place]  [phone] [phone]     ID: Jessica Glenn DOB: 1929/07/03  MR#: [id number]  PYK#:[id number]  Patient Care Team: Maury Dus, MD as PCP - General (Family Medicine) Magrinat, Virgie Dad, MD as Consulting Physician (Oncology) Jovita Kussmaul, MD as Consulting Physician (General Surgery) OTHER MD:  CHIEF COMPLAINT: Locally advanced/metastatic estrogen receptor positive left breast cancer  CURRENT TREATMENT: anastrozole   HISTORY OF CURRENT ILLNESS: From the original intake note:  Jessica Glenn tells me she first noted Glenn changes in her left breast about Glenn year and Glenn half ago. She had had some dental work around that time and thought possibly that could be related. She said her breast felt "like steel". She did not bring it to medical attention until her recent appointment with Dr. Alyson Ingles and he set her up for bilateral diagnostic mammography with tomography and left breast ultrasonography at the [facility] 06/19/2017. This found the breast density to be category C. In the upper outer retroareolar left breast there was an irregular mass estimated to be at least 6 cm by mammography. There were heterogeneous calcifications within the mass. There was skin thickening of the areola and an enlarged inferior left axillary lymph node was noted. On exam there was Glenn large fixed hard mass in the upper outer quadrant of the left breast causing protrusion of the areola. It measured approximately 7 cm by palpation and there was visible skin thickening. In the inferior left axilla there was Glenn firm palpable mass measuring 2 cm.  Ultrasound confirmed an irregular hypoechoic vascular mass in the left breast centered on the 1:00 position 4 cm from the nipple measuring 6.7 cm. This was abutting the overlying skin. It was Glenn 2.3 cm hypoechoic vascular mass in the left axilla. The right breast was unremarkable.  On 06/22/2017 the patient had left breast and left axillary node  biopsy, showing ([id number]) both sites to be involved by invasive ductal carcinoma, grade 2, with prognostic panels on both biopsies showing estrogen receptor 100% positivity, progesterone receptor 40-50% positivity, all with strong staining intensity, MIB-1:30 percent in the breast and 15% in the lymph nodes, and both HER-2 negative, with ratios of 0.76-0.85 and number per cell 1.10-1.30.  The patient's subsequent history is as detailed below.  INTERVAL HISTORY: Jessica Glenn returns today for follow-up and treatment of her estrogen receptor positive breast cancer accompanied by her husband.  She continues on anastrozole, with good tolerance. She denies issues with hot flashes or vaginal dryness.   Since her last visit, she underwent left mastectomy with sentinel lymph node sampling ([id number]) on 02/14/2018 showing: invasive ductal carcinoma grade I, spanning 6.1 cm. Low grade DCIS with negative margins. Lymphovascular invasion. Seborrheic keratosis. Out of Glenn total of 7 left axillary sentinel lymph nodes that were sampled, 2 left SNL showed evidence of metastatic carcinoma. 5 lymph nodes were negative for carcinoma.   REVIEW OF SYSTEMS: Jessica Glenn reports that she did well with her left mastectomy. She is feeling pain in the left breast, but more so in the left axilla where the lymph nodes were removed. She took pain medication to alleviate the pain. Her family has been helping her check for signs of necrosis, reddening, or bleeding, which have not been an issue. She has some constipation which she aids and prevents by taking Miralax. She denies unusual headaches, visual changes, nausea, vomiting, or dizziness. There has been no unusual cough, phlegm production, or pleurisy. This been no change in bowel or  bladder habits. She denies unexplained fatigue or unexplained weight loss, bleeding, rash, or fever. Glenn detailed review of systems was otherwise stable.     PAST MEDICAL HISTORY: Past Medical History:    Diagnosis Date  . Basal cell carcinoma    s/p Moh's surgery  to left side of nose  . Breast cancer, left breast ([place]) dx'd 05/2017  . History of tobacco use   . Varicose vein of leg    BLE    PAST SURGICAL HISTORY: Past Surgical History:  Procedure Laterality Date  . BREAST BIOPSY Left 05/2017  . CATARACT EXTRACTION W/ INTRAOCULAR LENS  IMPLANT, BILATERAL Bilateral   . DILATION AND CURETTAGE OF UTERUS    . FRACTURE SURGERY    . HARDWARE REMOVAL Right 02/14/2017   Procedure: RIGHT ANKLE HARDWARE REMOVAL;  Surgeon: [name], MD;  Location: [facility];  Service: Orthopedics;  Laterality: Right;  . I&D EXTREMITY Right 02/14/2017   Procedure: RIGHT IRRIGATION AND DEBRIDEMENT ANKLE;  Surgeon: [name], MD;  Location: [facility];  Service: Orthopedics;  Laterality: Right;  . MASTECTOMY Left 02/14/2018   LEFT MASTECTOMY WITH DEEP LEFT AXILLARY SENTINEL LYMPH NODE BIOPSY   . MASTECTOMY W/ SENTINEL NODE BIOPSY Left 02/14/2018   Procedure: LEFT MASTECTOMY WITH SENTINEL LYMPH NODE BIOPSY AND POSSIBLE NODE DISSECTION;  Surgeon: Jovita Kussmaul, MD;  Location: [facility];  Service: General;  Laterality: Left;  . MOHS SURGERY Left    "side of nose"  . ORIF ANKLE FRACTURE Right 02/02/2016   Procedure: OPEN REDUCTION INTERNAL FIXATION (ORIF) ANKLE FRACTURE;  Surgeon: [name], MD;  Location: [facility];  Service: Orthopedics;  Laterality: Right;  strykermini c armreg bedbone foam    FAMILY HISTORY Family History  Problem Relation Age of Onset  . COPD Mother   The patient's father had Glenn history of alcoholism and Glenn band in the family. The patient has no information on the paternal side. The patient's mother died from emphysema at the age of 82. The patient has one sister, 2 brothers. There is no history of breast or ovarian cancer in the family to her knowledge.  GYNECOLOGIC HISTORY:  No LMP recorded. Patient is postmenopausal. Menarche  age 16, first live birth age 43, the patient is Jessica Glenn. She went through the change of life in her 63s. She took hormone replacement for Glenn few years.   SOCIAL HISTORY:  The patient is originally from [place]. Her husband Jessica Arrow is Glenn traveling Theme park manager, Research officer, political party. Son peaked lives in [place] where he works as Glenn Theme park manager; son Ronalee Belts lives in [place] where he works as an Optometrist; son Annie Main lives in [place] where he works as Glenn Chief Strategy Officer; son Rodman Key also lives in [place] and works as Glenn Chief Strategy Officer. The patient has 14 grandchildren and 67 great-grandchildren    ADVANCED DIRECTIVES: In place   HEALTH MAINTENANCE: Social History   Tobacco Use  . Smoking status: Former Smoker    Packs/day: 1.00    Years: 8.00    Pack years: 8.00    Types: Cigarettes    Last attempt to quit: 1960    Years since quitting: 59.3  . Smokeless tobacco: Never Used  Substance Use Topics  . Alcohol use: Yes    Alcohol/week: 3.0 oz    Types: 5 Glasses of wine per week  . Drug use: No     Colonoscopy:Never  PAP:  Bone density:Osteopenia   No Known Allergies  Current Outpatient Medications  Medication Sig Dispense Refill  . anastrozole (ARIMIDEX) 1 MG tablet Take 1 tablet (1 mg total) by mouth daily. (Patient taking differently: Take 1 mg by mouth every evening. ) 90 tablet 4  . ascorbic acid (VITAMIN C) 1000 MG tablet Take 1,000 mg by mouth daily.    . Calcium Carb-Cholecalciferol (CALCIUM 600 + D PO) Take 1 tablet by mouth daily.    . Calcium-Magnesium-Zinc (CAL-MAG-ZINC PO) Take 1 tablet by mouth daily.    . Cholecalciferol (VITAMIN D3) 3000 units TABS Take 3,000 Units by mouth daily.    . Cyanocobalamin (B-12) 5000 MCG CAPS Take 5,000 mcg by mouth daily.    . diphenhydramine-acetaminophen (TYLENOL PM) 25-500 MG TABS tablet Take 1-2 tablets by mouth at bedtime.     Marland Kitchen doxylamine, Sleep, (UNISOM) 25 MG tablet Take 25 mg by mouth at bedtime as needed for sleep.    Marland Kitchen HYDROcodone-acetaminophen  (NORCO/VICODIN) 5-325 MG tablet Take 1-2 tablets by mouth every 6 (six) hours as needed for moderate pain or severe pain. 15 tablet 0  . Multiple Vitamin (MULTIVITAMIN WITH MINERALS) TABS tablet Take 1 tablet by mouth daily.    . Probiotic CAPS Take 1 capsule by mouth daily.    . Red Yeast Rice Extract (RED YEAST RICE PO) Take 1,200 mg by mouth 3 (three) times Glenn week.    Marland Kitchen VITAMIN E PO Take 180 Units by mouth daily.     No current facility-administered medications for this visit.     OBJECTIVE: Older white woman in no acute distress  Vitals:   02/23/18 1519  BP: 130/65  Pulse: 68  Resp: 18  Temp: 98.6 F (37 C)  SpO2: 99%     Body mass index is 24.89 kg/m.   Wt Readings from Last 3 Encounters:  02/23/18 149 lb 9.6 oz (67.9 kg)  02/14/18 157 lb 3 oz (71.3 kg)  02/13/18 151 lb 6.4 oz (68.7 kg)      ECOG FS:1 - Symptomatic but completely ambulatory  Sclerae unicteric No cervical or supraclavicular adenopathy Lungs no rales or rhonchi Heart regular rate and rhythm Abd soft, nontender, positive bowel sounds MSK no focal spinal tenderness, no upper extremity lymphedema Neuro: nonfocal, well oriented, appropriate affect Breasts: The right breast is unremarkable.  The left breast is status post mastectomy.  The incision is healing well, with no swelling, erythema, or dehiscence.  One drain is still in place, with approximately 15 cc of serosanguineous fluid in the bulb  LAB RESULTS:  CMP     Component Value Date/Time   NA 141 02/23/2018 1501   NA 140 10/27/2017 0908   K 4.0 02/23/2018 1501   K 4.8 10/27/2017 0908   CL 104 02/23/2018 1501   CO2 28 02/23/2018 1501   CO2 27 10/27/2017 0908   GLUCOSE 101 02/23/2018 1501   GLUCOSE 96 10/27/2017 0908   BUN 15 02/23/2018 1501   BUN 20.5 10/27/2017 0908   CREATININE 0.85 02/23/2018 1501   CREATININE 0.9 10/27/2017 0908   CALCIUM 9.7 02/23/2018 1501   CALCIUM 9.7 10/27/2017 0908   PROT 7.3 02/23/2018 1501   PROT 7.2  10/27/2017 0908   ALBUMIN 4.1 02/23/2018 1501   ALBUMIN 4.2 10/27/2017 0908   AST 16 02/23/2018 1501   AST 19 10/27/2017 0908   ALT 7 02/23/2018 1501   ALT 8 10/27/2017 0908   ALKPHOS 69 02/23/2018 1501   ALKPHOS 58 10/27/2017 0908   BILITOT 0.4 02/23/2018 1501   BILITOT 0.62 10/27/2017 0908  GFRNONAA 60 (L) 02/23/2018 1501   GFRAA >60 02/23/2018 1501    No results found for: TOTALPROTELP, ALBUMINELP, A1GS, A2GS, BETS, BETA2SER, GAMS, MSPIKE, SPEI  No results found for: KPAFRELGTCHN, LAMBDASER, [facility]  Lab Results  Component Value Date   WBC 5.9 02/23/2018   NEUTROABS 3.0 02/23/2018   HGB 13.8 02/23/2018   HCT 42.0 02/23/2018   MCV 92.5 02/23/2018   PLT 181 02/23/2018     Lab Results  Component Value Date   CA2729 222.5 (H) 02/13/2018       Urinalysis No results found for: COLORURINE, APPEARANCEUR, LABSPEC, PHURINE, GLUCOSEU, HGBUR, BILIRUBINUR, KETONESUR, PROTEINUR, UROBILINOGEN, NITRITE, LEUKOCYTESUR   STUDIES: Ct Chest W Contrast  Result Date: 02/05/2018 CLINICAL DATA:  Left breast cancer dx'd aug 2018. Pt received systemic hormone first. Now planning mastectomy and EXAM: CT CHEST WITH CONTRAST TECHNIQUE: Multidetector CT imaging of the chest was performed during intravenous contrast administration. CONTRAST:  39m OMNIPAQUE IOHEXOL 300 MG/ML  SOLN COMPARISON:  Chest CT 07/11/2017, bone scan 07/11/2017 FINDINGS: Cardiovascular: No significant vascular findings. Normal heart size. No pericardial effusion. Mediastinum/Nodes: RIGHT axial lymph node measures 15 x 17 mm compared with 26 x 19 mm. No new LEFT axillary adenopathy. LEFT central breast mass measures 43 x 48 mm compared with 50 x 48 mm. No supraclavicular adenopathy. No internal mammary adenopathy. No mediastinal adenopathy or hilar adenopathy. Lungs/Pleura: Small RIGHT lower lobe nodule measuring 4 mm (image 89/7) unchanged. Similar nodule in RIGHT lower lobe on image 94/7 measures 5 mm also unchanged. Two  additional adjacent nodules in the LEFT lower lobe (image 80/7) are also unchanged Small LEFT lower lobe nodule measuring 3 mm unchanged on image 86/7. No new pulmonary nodules. Upper Abdomen: Lesion the posterior RIGHT hepatic lobe has simple fluid attenuation and not changed in size most consistent with benign cyst. No new hepatic lesions. No upper abdominal adenopathy. Musculoskeletal: Lucent lesion in the posterior aspect of the T2 vertebral body measuring 15 mm. This lesion erodes the posterior wall of the vertebral body. IMPRESSION: 1. Decrease in size of LEFT breast mass and malignant LEFT axillary lymph node. 2. Approximately 4 pulmonary nodules in the RIGHT lower lobe are not changed in size. Lesions remain indeterminate but favored benign. 3. No evidence of intrathoracic nodal metastasis. 4. Unfortunately, lytic lesion in the posterior aspect of the T2 vertebral body is consistent with Glenn SKELETAL METASTASIS. These results will be called to the ordering clinician or representative by the Radiologist Assistant, and communication documented in the PACS or zVision Dashboard. Electronically Signed   By: SSuzy BouchardM.D.   On: 02/05/2018 16:27   Nm Sentinel Node Inj-no Rpt (breast)  Result Date: 02/14/2018 Sulfur colloid was injected by the nuclear medicine technologist for melanoma sentinel node.    ELIGIBLE FOR AVAILABLE RESEARCH PROTOCOL: no  ASSESSMENT: 84y.o. [place] woman status post left breast overlapping sites biopsy 06/22/2017 for Glenn clinical T3 N1-2, stage II-III invasive ductal carcinoma, grade 2, estrogen and progesterone receptor positive, HER-2 nonamplified, with an MIB-1 of 30%  (Glenn) staging CT of the chest and bone survey and bone scan July 11, 2017 showed multiple indeterminant subcentimeter pulmonary nodules which will require follow-up, as well as an indeterminate right hepatic lobe lesion, but no definite evidence of malignancy.  (b) CA-27-29 is informative  (1)  neo-adjuvant fulvestrant started on 07/05/2017, discontinued 12/20/2017 with possible progression  (2) status post left mastectomy and sentinel lymph node sampling 02/14/2018 for Glenn T3 N1, stage IIA invasive ductal  carcinoma, grade 1, with negative margins.  (Glenn) 2 of 7 removed lymph nodes were positive  (3) adjuvant radiation to follow surgery  (4) anastrozole started 01/17/2018  (Glenn) startnpalbociclib once stage IV disease documented  (5) METASTATIC DISEASE:   (Glenn) CT scan of the chest 02/05/2018 shows Glenn new lesion at T2; total spinal MRI pending  (b) biopsy of spinal lesion pending   PLAN: Maris did remarkably well with her mastectomy.  Hopefully her drain can come out next week.  Once she is feeling Glenn little bit better in that regard, in about 2 weeks, I have set her up for Glenn total spinal MRI and Glenn CT biopsy to follow.  She will then see me late May.  At that time if we do document stage IV disease as I expect she will start palbociclib  Given the size of her tumor and the positive lymph nodes I think she will benefit from adjuvant radiation.  That will be discussed at the conference this coming week.  I will be out of town at this time but I have alerted our navigator regarding the plan just stated  They had many questions regarding other studies including PET scans and CT scans etc.  They understand we do not have Glenn test that can find microscopic metastatic disease and that the resolution of the PET scan is not sufficient to tell us reliably whether the small spots in her lungs, which have been very stable, are metastases or not  They know to call for any other issues that may develop before the next visit.  Magrinat, Virgie Dad, MD  02/23/18 3:50 PM Medical Oncology and Hematology [facility] [address] [place], [place] [address] Tel. [phone]    Fax. [phone]  This document serves as Glenn record of services personally performed by Lurline Del, MD. It was  created on his behalf by Sheron Nightingale, Glenn trained medical scribe. The creation of this record is based on the scribe's personal observations and the provider's statements to them.   I have reviewed the above documentation for accuracy and completeness, and I agree with the above.

## 2018-02-23 ENCOUNTER — Inpatient Hospital Stay: Payer: Medicare Other

## 2018-02-23 ENCOUNTER — Telehealth: Payer: Self-pay

## 2018-02-23 ENCOUNTER — Inpatient Hospital Stay (HOSPITAL_BASED_OUTPATIENT_CLINIC_OR_DEPARTMENT_OTHER): Payer: Medicare Other | Admitting: Oncology

## 2018-02-23 VITALS — BP 130/65 | HR 68 | Temp 98.6°F | Resp 18 | Ht 65.0 in | Wt 149.6 lb

## 2018-02-23 DIAGNOSIS — C50812 Malignant neoplasm of overlapping sites of left female breast: Secondary | ICD-10-CM

## 2018-02-23 DIAGNOSIS — Z170 Estrogen receptor positive status [ER+]: Secondary | ICD-10-CM | POA: Diagnosis not present

## 2018-02-23 DIAGNOSIS — C50412 Malignant neoplasm of upper-outer quadrant of left female breast: Secondary | ICD-10-CM

## 2018-02-23 DIAGNOSIS — C50912 Malignant neoplasm of unspecified site of left female breast: Secondary | ICD-10-CM

## 2018-02-23 LAB — COMPREHENSIVE METABOLIC PANEL
ALBUMIN: 4.1 g/dL (ref 3.5–5.0)
ALT: 7 U/L (ref 0–55)
ANION GAP: 9 (ref 3–11)
AST: 16 U/L (ref 5–34)
Alkaline Phosphatase: 69 U/L (ref 40–150)
BUN: 15 mg/dL (ref 7–26)
CO2: 28 mmol/L (ref 22–29)
Calcium: 9.7 mg/dL (ref 8.4–10.4)
Chloride: 104 mmol/L (ref 98–109)
Creatinine, Ser: 0.85 mg/dL (ref 0.60–1.10)
GFR calc non Af Amer: 60 mL/min — ABNORMAL LOW (ref >60)
Glucose, Bld: 101 mg/dL (ref 70–140)
POTASSIUM: 4 mmol/L (ref 3.5–5.1)
SODIUM: 141 mmol/L (ref 136–145)
TOTAL PROTEIN: 7.3 g/dL (ref 6.4–8.3)
Total Bilirubin: 0.4 mg/dL (ref 0.2–1.2)

## 2018-02-23 LAB — CBC WITH DIFFERENTIAL/PLATELET
BASOS ABS: 0 10*3/uL (ref 0.0–0.1)
BASOS PCT: 0 %
Eosinophils Absolute: 0.3 10*3/uL (ref 0.0–0.5)
Eosinophils Relative: 5 %
HEMATOCRIT: 42 % (ref 34.8–46.6)
HEMOGLOBIN: 13.8 g/dL (ref 11.6–15.9)
LYMPHS PCT: 34 %
Lymphs Abs: 2 10*3/uL (ref 0.9–3.3)
MCH: 30.4 pg (ref 25.1–34.0)
MCHC: 32.9 g/dL (ref 31.5–36.0)
MCV: 92.5 fL (ref 79.5–101.0)
MONOS PCT: 11 %
Monocytes Absolute: 0.6 10*3/uL (ref 0.1–0.9)
NEUTROS ABS: 3 10*3/uL (ref 1.5–6.5)
NEUTROS PCT: 50 %
Platelets: 181 10*3/uL (ref 145–400)
RBC: 4.54 MIL/uL (ref 3.70–5.45)
RDW: 13.1 % (ref 11.2–14.5)
WBC: 5.9 10*3/uL (ref 3.9–10.3)

## 2018-02-23 NOTE — Telephone Encounter (Signed)
Printed avs and calender of upcoming appointment. Per 4/26 los gave information for ct scheduling.

## 2018-02-24 LAB — CANCER ANTIGEN 27.29: CA 27.29: 162.2 U/mL — ABNORMAL HIGH (ref 0.0–38.6)

## 2018-03-05 ENCOUNTER — Other Ambulatory Visit: Payer: Self-pay | Admitting: Oncology

## 2018-03-14 ENCOUNTER — Ambulatory Visit (HOSPITAL_COMMUNITY)
Admission: RE | Admit: 2018-03-14 | Discharge: 2018-03-14 | Disposition: A | Discharge status: Home / Self Care | Payer: Medicare Other | Source: Ambulatory Visit | Attending: Oncology | Admitting: Oncology

## 2018-03-14 DIAGNOSIS — C50812 Malignant neoplasm of overlapping sites of left female breast: Secondary | ICD-10-CM | POA: Insufficient documentation

## 2018-03-14 DIAGNOSIS — C50412 Malignant neoplasm of upper-outer quadrant of left female breast: Secondary | ICD-10-CM | POA: Diagnosis not present

## 2018-03-14 DIAGNOSIS — C7951 Secondary malignant neoplasm of bone: Secondary | ICD-10-CM | POA: Diagnosis not present

## 2018-03-14 DIAGNOSIS — Z170 Estrogen receptor positive status [ER+]: Secondary | ICD-10-CM | POA: Diagnosis not present

## 2018-03-14 DIAGNOSIS — C50912 Malignant neoplasm of unspecified site of left female breast: Secondary | ICD-10-CM

## 2018-03-14 MED ORDER — GADOBENATE DIMEGLUMINE 529 MG/ML IV SOLN
15.0000 mL | Freq: Once | INTRAVENOUS | Status: AC | PRN
  Administered 2018-03-14: 14 mL via INTRAVENOUS

## 2018-03-15 ENCOUNTER — Other Ambulatory Visit: Payer: Self-pay | Admitting: Oncology

## 2018-03-15 DIAGNOSIS — Z170 Estrogen receptor positive status [ER+]: Secondary | ICD-10-CM

## 2018-03-15 DIAGNOSIS — C50912 Malignant neoplasm of unspecified site of left female breast: Secondary | ICD-10-CM

## 2018-03-15 DIAGNOSIS — C50412 Malignant neoplasm of upper-outer quadrant of left female breast: Secondary | ICD-10-CM

## 2018-03-15 DIAGNOSIS — C50812 Malignant neoplasm of overlapping sites of left female breast: Secondary | ICD-10-CM

## 2018-03-19 ENCOUNTER — Other Ambulatory Visit: Payer: Self-pay | Admitting: *Deleted

## 2018-03-20 NOTE — Progress Notes (Addendum)
[facility]  Telephone:([phone]) [phone] [phone]     ID: Jessica Glenn DOB: 02-22-1929  MR#: [id number]  ZHY#:[id number]  Patient Care Team: Maury Dus, MD as PCP - General (Family Medicine) [place] Lamons, Virgie Dad, MD as Consulting Physician (Oncology) Jovita Kussmaul, MD as Consulting Physician (General Surgery) OTHER MD:  CHIEF COMPLAINT: Locally advanced/metastatic estrogen receptor positive left breast cancer  CURRENT TREATMENT: anastrozole   HISTORY OF CURRENT ILLNESS: From the original intake note:  Jessica Glenn tells me she first noted a changes in her left breast about a year and a half ago. She had had some dental work around that time and thought possibly that could be related. She said her breast felt "like steel". She did not bring it to medical attention until her recent appointment with Dr. Alyson Ingles and he set her up for bilateral diagnostic mammography with tomography and left breast ultrasonography at the [facility] 06/19/2017. This found the breast density to be category C. In the upper outer retroareolar left breast there was an irregular mass estimated to be at least 6 cm by mammography. There were heterogeneous calcifications within the mass. There was skin thickening of the areola and an enlarged inferior left axillary lymph node was noted. On exam there was a large fixed hard mass in the upper outer quadrant of the left breast causing protrusion of the areola. It measured approximately 7 cm by palpation and there was visible skin thickening. In the inferior left axilla there was a firm palpable mass measuring 2 cm.  Ultrasound confirmed an irregular hypoechoic vascular mass in the left breast centered on the 1:00 position 4 cm from the nipple measuring 6.7 cm. This was abutting the overlying skin. It was a 2.3 cm hypoechoic vascular mass in the left axilla. The right breast was unremarkable.  On 06/22/2017 the patient had left breast and left axillary node  biopsy, showing ([id number]) both sites to be involved by invasive ductal carcinoma, grade 2, with prognostic panels on both biopsies showing estrogen receptor 100% positivity, progesterone receptor 40-50% positivity, all with strong staining intensity, MIB-1:30 percent in the breast and 15% in the lymph nodes, and both HER-2 negative, with ratios of 0.76-0.85 and number per cell 1.10-1.30.  The patient's subsequent history is as detailed below.  INTERVAL HISTORY: Jessica Glenn returns today for follow-up and treatment of her estrogen receptor positive breast cancer accompanied by her husband. She continues on anastrozole, with good tolerance. She denies issues with hot flashes or vaginal dryness. She pays $4 for a 3 month supply.   Since her last visit, she completed a total spinal MRI on 03/14/2018 showing: Unchanged size of T2 vertebral body metastasis. No epidural tumor. 4 mm enhancing lesion in the T8 vertebral body, indeterminate. No evidence of metastatic disease in the cervical or lumbar spine.  She also had a PET scan on 03/29/2018 with results, by my reading, showing no additional suspicious areas that might be amenable to biopsy.  The 2 spinal lesions do not seem to show a high SUV.  The definitive reading is pending however  REVIEW OF SYSTEMS: Jessica Glenn is having urinary urgency that is difficult to control. This has been a new issue for her that occurs about once per day. She notices this more when she is in public because of problems finding the restroom. She denies dysuria or hematuria. For exercise, she walks often. She took a break from walking after having her drain removed in the left breast on 03/20/2018. She  enjoyed going to the beach last weekend, and she had no issues with pain or swelling in the left breast during her trip. She denies unusual headaches, visual changes, nausea, vomiting, or dizziness. There has been no unusual cough, phlegm production, or pleurisy. This been no change in bowel  or bladder habits. She denies unexplained fatigue or unexplained weight loss, bleeding, rash, or fever. A detailed review of systems was otherwise stable.     PAST MEDICAL HISTORY: Past Medical History:  Diagnosis Date  . Basal cell carcinoma    s/p Moh's surgery  to left side of nose  . Breast cancer, left breast ([place]) dx'd 05/2017  . History of tobacco use   . Varicose vein of leg    BLE    PAST SURGICAL HISTORY: Past Surgical History:  Procedure Laterality Date  . BREAST BIOPSY Left 05/2017  . CATARACT EXTRACTION W/ INTRAOCULAR LENS  IMPLANT, BILATERAL Bilateral   . DILATION AND CURETTAGE OF UTERUS    . FRACTURE SURGERY    . HARDWARE REMOVAL Right 02/14/2017   Procedure: RIGHT ANKLE HARDWARE REMOVAL;  Surgeon: [name], MD;  Location: [facility];  Service: Orthopedics;  Laterality: Right;  . I&D EXTREMITY Right 02/14/2017   Procedure: RIGHT IRRIGATION AND DEBRIDEMENT ANKLE;  Surgeon: [name], MD;  Location: [facility];  Service: Orthopedics;  Laterality: Right;  . MASTECTOMY Left 02/14/2018   LEFT MASTECTOMY WITH DEEP LEFT AXILLARY SENTINEL LYMPH NODE BIOPSY   . MASTECTOMY W/ SENTINEL NODE BIOPSY Left 02/14/2018   Procedure: LEFT MASTECTOMY WITH SENTINEL LYMPH NODE BIOPSY AND POSSIBLE NODE DISSECTION;  Surgeon: Jovita Kussmaul, MD;  Location: [facility];  Service: General;  Laterality: Left;  . MOHS SURGERY Left    "side of nose"  . ORIF ANKLE FRACTURE Right 02/02/2016   Procedure: OPEN REDUCTION INTERNAL FIXATION (ORIF) ANKLE FRACTURE;  Surgeon: [name], MD;  Location: [facility];  Service: Orthopedics;  Laterality: Right;  strykermini c armreg bedbone foam    FAMILY HISTORY Family History  Problem Relation Age of Onset  . COPD Mother   The patient's father had a history of alcoholism and a band in the family. The patient has no information on the paternal side. The patient's mother died from emphysema at the age of  48. The patient has one sister, 2 brothers. There is no history of breast or ovarian cancer in the family to her knowledge.  GYNECOLOGIC HISTORY:  No LMP recorded. Patient is postmenopausal. Menarche age 40, first live birth age 55, the patient is Humphreys P4. She went through the change of life in her 28s. She took hormone replacement for a few years.   SOCIAL HISTORY:  The patient is originally from [place]. Her husband Laurey Arrow is a traveling Theme park manager, Research officer, political party. Son peaked lives in [place] where he works as a Theme park manager; son Ronalee Belts lives in [place] where he works as an Optometrist; son Annie Main lives in [place] where he works as a Chief Strategy Officer; son Rodman Key also lives in [place] and works as a Chief Strategy Officer. The patient has 14 grandchildren and 69 great-grandchildren    ADVANCED DIRECTIVES: In place   HEALTH MAINTENANCE: Social History   Tobacco Use  . Smoking status: Former Smoker    Packs/day: 1.00    Years: 8.00    Pack years: 8.00    Types: Cigarettes    Last attempt to quit: 1960    Years since quitting: 59.4  . Smokeless tobacco: Never Used  Substance Use Topics  . Alcohol use: Yes    Alcohol/week: 3.0 oz    Types: 5 Glasses of wine per week  . Drug use: No     Colonoscopy:Never  PAP:  Bone density:Osteopenia   No Known Allergies  Current Outpatient Medications  Medication Sig Dispense Refill  . anastrozole (ARIMIDEX) 1 MG tablet Take 1 tablet (1 mg total) by mouth daily. (Patient taking differently: Take 1 mg by mouth every evening. ) 90 tablet 4  . ascorbic acid (VITAMIN C) 1000 MG tablet Take 1,000 mg by mouth daily.    . Calcium Carb-Cholecalciferol (CALCIUM 600 + D PO) Take 1 tablet by mouth daily.    . Calcium-Magnesium-Zinc (CAL-MAG-ZINC PO) Take 1 tablet by mouth daily.    . Cholecalciferol (VITAMIN D3) 3000 units TABS Take 3,000 Units by mouth daily.    . Cyanocobalamin (B-12) 5000 MCG CAPS Take 5,000 mcg by mouth daily.    . diphenhydramine-acetaminophen  (TYLENOL PM) 25-500 MG TABS tablet Take 1-2 tablets by mouth at bedtime.     Marland Kitchen doxylamine, Sleep, (UNISOM) 25 MG tablet Take 25 mg by mouth at bedtime as needed for sleep.    . Multiple Vitamin (MULTIVITAMIN WITH MINERALS) TABS tablet Take 1 tablet by mouth daily.    . Probiotic CAPS Take 1 capsule by mouth daily.    . Red Yeast Rice Extract (RED YEAST RICE PO) Take 1,200 mg by mouth 3 (three) times a week.    Marland Kitchen VITAMIN E PO Take 180 Units by mouth daily.     No current facility-administered medications for this visit.     OBJECTIVE: Older white woman who appears stated age  21:   03/30/18 0929  BP: (!) 152/67  Pulse: 68  Resp: 18  Temp: 98.4 F (36.9 C)  SpO2: 98%     Body mass index is 25.54 kg/m.   Wt Readings from Last 3 Encounters:  03/30/18 153 lb 8 oz (69.6 kg)  02/23/18 149 lb 9.6 oz (67.9 kg)  02/14/18 157 lb 3 oz (71.3 kg)      ECOG FS:1 - Symptomatic but completely ambulatory  Sclerae unicteric, EOMs intact No cervical or supraclavicular adenopathy Lungs no rales or rhonchi Heart regular rate and rhythm Abd soft, nontender, positive bowel sounds MSK no focal spinal tenderness and specifically no tenderness around the T2 or T8 areas, no upper extremity lymphedema Neuro: nonfocal, well oriented, appropriate affect Breasts: The right breast is benign.  The left breast is status post mastectomy.  The final incision result is irregular, but there is no evidence of infection or dehiscence.  In the inferior lateral aspect there is a small residual seroma measuring approximately 1-1/2 x 2/3 of a centimeter.  Both axillae are benign   LAB RESULTS:  CMP     Component Value Date/Time   NA 141 02/23/2018 1501   NA 140 10/27/2017 0908   K 4.0 02/23/2018 1501   K 4.8 10/27/2017 0908   CL 104 02/23/2018 1501   CO2 28 02/23/2018 1501   CO2 27 10/27/2017 0908   GLUCOSE 101 02/23/2018 1501   GLUCOSE 96 10/27/2017 0908   BUN 15 02/23/2018 1501   BUN 20.5 10/27/2017  0908   CREATININE 0.85 02/23/2018 1501   CREATININE 0.9 10/27/2017 0908   CALCIUM 9.7 02/23/2018 1501   CALCIUM 9.7 10/27/2017 0908   PROT 7.3 02/23/2018 1501   PROT 7.2 10/27/2017 0908   ALBUMIN 4.1 02/23/2018 1501   ALBUMIN 4.2 10/27/2017 0908  AST 16 02/23/2018 1501   AST 19 10/27/2017 0908   ALT 7 02/23/2018 1501   ALT 8 10/27/2017 0908   ALKPHOS 69 02/23/2018 1501   ALKPHOS 58 10/27/2017 0908   BILITOT 0.4 02/23/2018 1501   BILITOT 0.62 10/27/2017 0908   GFRNONAA 60 (L) 02/23/2018 1501   GFRAA >60 02/23/2018 1501    No results found for: TOTALPROTELP, ALBUMINELP, A1GS, A2GS, BETS, BETA2SER, GAMS, MSPIKE, SPEI  No results found for: KPAFRELGTCHN, LAMBDASER, KAPLAMBRATIO  Lab Results  Component Value Date   WBC 4.3 03/30/2018   NEUTROABS 2.2 03/30/2018   HGB 13.2 03/30/2018   HCT 39.7 03/30/2018   MCV 90.8 03/30/2018   PLT 192 03/30/2018     Lab Results  Component Value Date   CA2729 162.2 (H) 02/23/2018       Urinalysis No results found for: COLORURINE, APPEARANCEUR, LABSPEC, PHURINE, GLUCOSEU, HGBUR, BILIRUBINUR, KETONESUR, PROTEINUR, UROBILINOGEN, NITRITE, LEUKOCYTESUR   STUDIES: Mr Total Spine Mets Screening  Result Date: 03/14/2018 CLINICAL DATA:  History of left upper outer quadrant breast cancer. Thoracic spine metastasis on prior CT. EXAM: MRI TOTAL SPINE WITHOUT AND WITH CONTRAST TECHNIQUE: Multisequence MR imaging of the spine from the cervical spine to the sacrum was performed prior to and following IV contrast administration for evaluation of spinal metastatic disease. Large field-of-view sagittal imaging was performed of the entire spine. Limited axial imaging was performed through the upper thoracic spine. CONTRAST:  106m MULTIHANCE GADOBENATE DIMEGLUMINE 529 MG/ML IV SOLN COMPARISON:  Chest CT 02/05/2018. Nuclear medicine bone scan 07/11/2017. FINDINGS: MRI CERVICAL SPINE FINDINGS Alignment: Cervical spine straightening.  No significant listhesis.  Vertebrae: No fracture or suspicious osseous lesion. C5 vertebral body hemangioma. Cord: Normal signal and morphology. Posterior Fossa, vertebral arteries, paraspinal tissues: Mild bilateral maxillary sinus mucosal thickening. No significant paraspinal soft tissue abnormality identified on this limited study. Disc levels: Mild disc degeneration throughout the mid and lower cervical spine, incompletely evaluated given lack of axial imaging. Broad-based disc osteophyte complex at C5-6 may contact the ventral spinal cord without evidence of cord compression or high-grade spinal stenosis. Mild multilevel cervical facet hypertrophy is noted. MRI THORACIC SPINE FINDINGS Alignment:  Normal. Vertebrae: 16 mm heterogeneously T2 hyperintense, enhancing lesion posteriorly in the T2 vertebral body is unchanged in size from the prior CT with disruption of the posterior vertebral body cortex again noted but without evidence of epidural tumor. A few small hemangiomas are noted in the lower thoracic spine. A 4 mm mildly enhancing lesion is present in the T8 vertebral body immediately posterior to a small hemangioma. Degenerative endplate changes and multiple Schmorl's nodes are present in the lower thoracic spine. Cord:  Normal signal and morphology. Paraspinal and other soft tissues: No significant paraspinal soft tissue abnormality identified on this limited study. Disc levels: Mild diffuse thoracic disc degeneration, greatest at T10-11. No spinal stenosis. Mild facet arthrosis scattered throughout the thoracic spine without high-grade neural foraminal stenosis. MRI LUMBAR SPINE FINDINGS Segmentation:  Standard. Alignment: Mild S-shaped lumbar scoliosis. No significant listhesis. Vertebrae: No fracture or suspicious osseous lesion. Degenerative endplate changes and Schmorl's nodes throughout the lumbar spine. Conus medullaris: Extends to the L1-2 level and appears normal. Paraspinal and other soft tissues: No significant  paraspinal soft tissue abnormality identified on this limited study. Disc levels: Disc degeneration with mild-to-moderate disc space narrowing throughout the lumbar spine. Diffuse disc bulging and facet arthrosis without evidence of significant spinal stenosis on this limited study. Right-sided neural foraminal stenosis is moderate at L4-5 and mild  at L5-S1. IMPRESSION: 1. Unchanged size of T2 vertebral body metastasis. No epidural tumor. 2. 4 mm enhancing lesion in the T8 vertebral body, indeterminate. 3. No evidence of metastatic disease in the cervical or lumbar spine. Electronically Signed   By: Logan Bores M.D.   On: 03/14/2018 14:13    ELIGIBLE FOR AVAILABLE RESEARCH PROTOCOL: no  ASSESSMENT: 84 y.o. [place] woman status post left breast overlapping sites biopsy 06/22/2017 for a clinical T3 N1-2, stage II-III invasive ductal carcinoma, grade 2, estrogen and progesterone receptor positive, HER-2 nonamplified, with an MIB-1 of 30%  (a) staging CT of the chest and bone survey and bone scan July 11, 2017 showed multiple indeterminant subcentimeter pulmonary nodules which will require follow-up, as well as an indeterminate right hepatic lobe lesion, but no definite evidence of malignancy.  (b) CA-27-29 is informative  (1) neo-adjuvant fulvestrant started on 07/05/2017, discontinued 12/20/2017 with possible progression  (2) status post left mastectomy and sentinel lymph node sampling 02/14/2018 for a T3 N1, stage IIA invasive ductal carcinoma, grade 1, with negative margins.  (a) 2 of 7 removed lymph nodes were positive  (3) adjuvant radiation to follow surgery  (4) anastrozole started 01/17/2018  (a) start palbociclib once disease progression is documented  (5) METASTATIC DISEASE:   (a) CT scan of the chest 02/05/2018 shows a new lesion at T2  (b) total spinal MRI 03/14/2018 confirms lesion at T2 and possible minimal lesion at T8, neither amenable to biopsy; there are no other  suspicious lesions  (6) referral to radiation oncology placed 03/30/2018    PLAN: I spent approximately 30 minutes with Jessica Glenn with most of that time spent discussing her complex problems.  She understands that even if she did not have a T8 lesion, she is at high risk of having microscopic disseminated disease because her tumor was large and lymph nodes were positive.  For the same reason she is going to need adjuvant radiation to the chest wall and draining lymph node areas.  I would also recommend radiation to the T2 lesion, even in the absence of biopsy-proven metastatic disease.  She will discuss that with radiation oncology and hopefully they will see her in the next few days to begin treatment planning.  I think she will benefit from capecitabine sensitization during radiation and I will discuss that with radiation oncology once she has been assigned a radiation oncologist  The other question regarding systemic therapy is whether or not to add anastrozole at this point.  [place] [address] which is informative and it is dropping.  In the absence of any active disease at present by my reading of the PET scan I would favor continuing anastrozole until either the CA-27-29 starts to rise or there is evidence of disease progression.  She is very agreeable with this plan, which I wrote out for her  Assuming all goes well she is going to see me sometime early July, by which time she will be both on radiation and on capecitabine and we will be able to assess her tolerance  She knows to call for any other issues that may develop before her next visit.   Avianah Pellman, Virgie Dad, MD  03/30/18 9:49 AM Medical Oncology and Hematology [facility] [address] [place], [place] [address] Tel. [phone]    Fax. [phone]  Alice Rieger, am acting as scribe for Chauncey Cruel MD.  I, Lurline Del MD, have reviewed the above documentation for accuracy  and completeness,  and I agree with the above.  ADDENDUM: Nm Pet Image Initial (pi) Skull Base To Thigh  Result Date: 03/30/2018 CLINICAL DATA:  Initial treatment strategy for metastatic left breast cancer. EXAM: NUCLEAR MEDICINE PET SKULL BASE TO THIGH TECHNIQUE: 7.7 mCi F-18 FDG was injected intravenously. Full-ring PET imaging was performed from the skull base to thigh after the radiotracer. CT data was obtained and used for attenuation correction and anatomic localization. Fasting blood glucose: 90 mg/dl COMPARISON:  Chest CT 02/05/2018. MRI of the thoracic spine of 03/14/2018. FINDINGS: Mediastinal blood pool activity: SUV max 2.2 NECK: No areas of abnormal hypermetabolism. Incidental CT findings: No cervical adenopathy. Bilateral carotid atherosclerosis. Right greater than left maxillary sinus mucosal thickening. CHEST: Interval left mastectomy with axillary node dissection. Postoperative hypermetabolism of the left chest wall. Left axillary low-density collection of 2.1 cm is non hypermetabolic, including on image 62/4. There is a low left axillary/lateral chest wall 1.9 cm low-density collection which is also and not hypermetabolic. Incidental CT findings: Aortic and branch vessel atherosclerosis. Mild cardiomegaly, accentuated by a pectus excavatum deformity.Relatively similar scattered bilateral pulmonary nodules. A left apical ground-glass nodule is similar at 8 mm on image 10/8. ABDOMEN/PELVIS: No abdominopelvic parenchymal or nodal hypermetabolism. Incidental CT findings: Right hepatic lobe cyst. Normal adrenal glands. Abdominal aortic atherosclerosis. Pelvic floor laxity. Left adnexal/ovarian 3.9 cm low-density lesion on image 165/4. SKELETON: Hypermetabolism within the posterior aspect of the T2 vertebral body measures a S.U.V. max of 3.9. Incidental CT findings: none IMPRESSION: 1. Interval left mastectomy and axillary node dissection. Left axillary and chest wall low-density non FDG avid structures are likely  postoperative seromas. 2. Isolated T2 osseous metastasis, as on prior CT and MRI. 3. Nonspecific bilateral pulmonary nodules, including a left apical ground-glass nodule. Below PET resolution. 4. Non FDG avid low-density left adnexal/ovarian lesion is most likely benign cyst. Consider further evaluation with pelvic ultrasound. Electronically Signed   By: Abigail Miyamoto M.D.   On: 03/30/2018 10:14   Mr Total Spine Mets Screening  Result Date: 03/14/2018 CLINICAL DATA:  History of left upper outer quadrant breast cancer. Thoracic spine metastasis on prior CT. EXAM: MRI TOTAL SPINE WITHOUT AND WITH CONTRAST TECHNIQUE: Multisequence MR imaging of the spine from the cervical spine to the sacrum was performed prior to and following IV contrast administration for evaluation of spinal metastatic disease. Large field-of-view sagittal imaging was performed of the entire spine. Limited axial imaging was performed through the upper thoracic spine. CONTRAST:  38m MULTIHANCE GADOBENATE DIMEGLUMINE 529 MG/ML IV SOLN COMPARISON:  Chest CT 02/05/2018. Nuclear medicine bone scan 07/11/2017. FINDINGS: MRI CERVICAL SPINE FINDINGS Alignment: Cervical spine straightening.  No significant listhesis. Vertebrae: No fracture or suspicious osseous lesion. C5 vertebral body hemangioma. Cord: Normal signal and morphology. Posterior Fossa, vertebral arteries, paraspinal tissues: Mild bilateral maxillary sinus mucosal thickening. No significant paraspinal soft tissue abnormality identified on this limited study. Disc levels: Mild disc degeneration throughout the mid and lower cervical spine, incompletely evaluated given lack of axial imaging. Broad-based disc osteophyte complex at C5-6 may contact the ventral spinal cord without evidence of cord compression or high-grade spinal stenosis. Mild multilevel cervical facet hypertrophy is noted. MRI THORACIC SPINE FINDINGS Alignment:  Normal. Vertebrae: 16 mm heterogeneously T2 hyperintense,  enhancing lesion posteriorly in the T2 vertebral body is unchanged in size from the prior CT with disruption of the posterior vertebral body cortex again noted but without evidence of epidural tumor. A few small hemangiomas are noted in the lower  thoracic spine. A 4 mm mildly enhancing lesion is present in the T8 vertebral body immediately posterior to a small hemangioma. Degenerative endplate changes and multiple Schmorl's nodes are present in the lower thoracic spine. Cord:  Normal signal and morphology. Paraspinal and other soft tissues: No significant paraspinal soft tissue abnormality identified on this limited study. Disc levels: Mild diffuse thoracic disc degeneration, greatest at T10-11. No spinal stenosis. Mild facet arthrosis scattered throughout the thoracic spine without high-grade neural foraminal stenosis. MRI LUMBAR SPINE FINDINGS Segmentation:  Standard. Alignment: Mild S-shaped lumbar scoliosis. No significant listhesis. Vertebrae: No fracture or suspicious osseous lesion. Degenerative endplate changes and Schmorl's nodes throughout the lumbar spine. Conus medullaris: Extends to the L1-2 level and appears normal. Paraspinal and other soft tissues: No significant paraspinal soft tissue abnormality identified on this limited study. Disc levels: Disc degeneration with mild-to-moderate disc space narrowing throughout the lumbar spine. Diffuse disc bulging and facet arthrosis without evidence of significant spinal stenosis on this limited study. Right-sided neural foraminal stenosis is moderate at L4-5 and mild at L5-S1. IMPRESSION: 1. Unchanged size of T2 vertebral body metastasis. No epidural tumor. 2. 4 mm enhancing lesion in the T8 vertebral body, indeterminate. 3. No evidence of metastatic disease in the cervical or lumbar spine. Electronically Signed   By: Logan Bores M.D.   On: 03/14/2018 14:13

## 2018-03-29 ENCOUNTER — Ambulatory Visit (HOSPITAL_COMMUNITY)
Admission: RE | Admit: 2018-03-29 | Discharge: 2018-03-29 | Disposition: A | Discharge status: Home / Self Care | Payer: Medicare Other | Source: Ambulatory Visit | Attending: Oncology | Admitting: Oncology

## 2018-03-29 DIAGNOSIS — C50412 Malignant neoplasm of upper-outer quadrant of left female breast: Secondary | ICD-10-CM

## 2018-03-29 DIAGNOSIS — R9389 Abnormal findings on diagnostic imaging of other specified body structures: Secondary | ICD-10-CM | POA: Insufficient documentation

## 2018-03-29 DIAGNOSIS — R918 Other nonspecific abnormal finding of lung field: Secondary | ICD-10-CM | POA: Insufficient documentation

## 2018-03-29 DIAGNOSIS — C7951 Secondary malignant neoplasm of bone: Secondary | ICD-10-CM | POA: Diagnosis not present

## 2018-03-29 DIAGNOSIS — Z9889 Other specified postprocedural states: Secondary | ICD-10-CM | POA: Insufficient documentation

## 2018-03-29 DIAGNOSIS — Z170 Estrogen receptor positive status [ER+]: Secondary | ICD-10-CM

## 2018-03-29 DIAGNOSIS — Z9012 Acquired absence of left breast and nipple: Secondary | ICD-10-CM | POA: Insufficient documentation

## 2018-03-29 DIAGNOSIS — C50812 Malignant neoplasm of overlapping sites of left female breast: Secondary | ICD-10-CM

## 2018-03-29 DIAGNOSIS — N839 Noninflammatory disorder of ovary, fallopian tube and broad ligament, unspecified: Secondary | ICD-10-CM | POA: Diagnosis not present

## 2018-03-29 DIAGNOSIS — C50912 Malignant neoplasm of unspecified site of left female breast: Secondary | ICD-10-CM

## 2018-03-29 LAB — GLUCOSE, CAPILLARY: Glucose-Capillary: 90 mg/dL (ref 65–99)

## 2018-03-29 MED ORDER — FLUDEOXYGLUCOSE F - 18 (FDG) INJECTION
7.7000 | Freq: Once | INTRAVENOUS | Status: AC | PRN
  Administered 2018-03-29: 7.7 via INTRAVENOUS

## 2018-03-30 ENCOUNTER — Inpatient Hospital Stay: Payer: Medicare Other

## 2018-03-30 ENCOUNTER — Other Ambulatory Visit: Payer: Self-pay | Admitting: Pharmacist

## 2018-03-30 ENCOUNTER — Inpatient Hospital Stay: Payer: Medicare Other | Attending: Oncology | Admitting: Oncology

## 2018-03-30 VITALS — BP 152/67 | HR 68 | Temp 98.4°F | Resp 18 | Ht 65.0 in | Wt 153.5 lb

## 2018-03-30 DIAGNOSIS — Z170 Estrogen receptor positive status [ER+]: Secondary | ICD-10-CM | POA: Diagnosis not present

## 2018-03-30 DIAGNOSIS — C50812 Malignant neoplasm of overlapping sites of left female breast: Secondary | ICD-10-CM

## 2018-03-30 DIAGNOSIS — Z79811 Long term (current) use of aromatase inhibitors: Secondary | ICD-10-CM | POA: Insufficient documentation

## 2018-03-30 DIAGNOSIS — C50912 Malignant neoplasm of unspecified site of left female breast: Secondary | ICD-10-CM

## 2018-03-30 DIAGNOSIS — I700 Atherosclerosis of aorta: Secondary | ICD-10-CM | POA: Insufficient documentation

## 2018-03-30 DIAGNOSIS — C7951 Secondary malignant neoplasm of bone: Secondary | ICD-10-CM | POA: Diagnosis not present

## 2018-03-30 DIAGNOSIS — C50412 Malignant neoplasm of upper-outer quadrant of left female breast: Secondary | ICD-10-CM

## 2018-03-30 LAB — CBC WITH DIFFERENTIAL/PLATELET
Basophils Absolute: 0 10*3/uL (ref 0.0–0.1)
Basophils Relative: 1 %
EOS PCT: 7 %
Eosinophils Absolute: 0.3 10*3/uL (ref 0.0–0.5)
HCT: 39.7 % (ref 34.8–46.6)
Hemoglobin: 13.2 g/dL (ref 11.6–15.9)
LYMPHS ABS: 1.3 10*3/uL (ref 0.9–3.3)
LYMPHS PCT: 30 %
MCH: 30.1 pg (ref 25.1–34.0)
MCHC: 33.1 g/dL (ref 31.5–36.0)
MCV: 90.8 fL (ref 79.5–101.0)
Monocytes Absolute: 0.5 10*3/uL (ref 0.1–0.9)
Monocytes Relative: 11 %
Neutro Abs: 2.2 10*3/uL (ref 1.5–6.5)
Neutrophils Relative %: 51 %
Platelets: 192 10*3/uL (ref 145–400)
RBC: 4.37 MIL/uL (ref 3.70–5.45)
RDW: 13.1 % (ref 11.2–14.5)
WBC: 4.3 10*3/uL (ref 3.9–10.3)

## 2018-03-30 LAB — COMPREHENSIVE METABOLIC PANEL
ALT: 9 U/L (ref 0–55)
ANION GAP: 10 (ref 3–11)
AST: 18 U/L (ref 5–34)
Albumin: 4 g/dL (ref 3.5–5.0)
Alkaline Phosphatase: 62 U/L (ref 40–150)
BILIRUBIN TOTAL: 0.4 mg/dL (ref 0.2–1.2)
BUN: 17 mg/dL (ref 7–26)
CHLORIDE: 105 mmol/L (ref 98–109)
CO2: 27 mmol/L (ref 22–29)
Calcium: 9.6 mg/dL (ref 8.4–10.4)
Creatinine, Ser: 0.92 mg/dL (ref 0.60–1.10)
GFR calc Af Amer: >60 mL/min (ref >60)
GFR, EST NON AFRICAN AMERICAN: 54 mL/min — AB (ref >60)
Glucose, Bld: 79 mg/dL (ref 70–140)
POTASSIUM: 4.8 mmol/L (ref 3.5–5.1)
Sodium: 142 mmol/L (ref 136–145)
TOTAL PROTEIN: 6.9 g/dL (ref 6.4–8.3)

## 2018-03-30 NOTE — Telephone Encounter (Signed)
Oral Oncology Pharmacist Encounter  Noted per office visit on 03/30/18, patient will not start Ibrance therapy at this time. Patient will likely start radiation to spine lesion with capecitabine sensitization.  Ibrance referral will be closed.  Oral Oncology Clinic will start capecitabine work-up once instructed by MD.  Johny Drilling, PharmD, BCPS, BCOP  03/30/2018 10:45 AM Oral Oncology Clinic [phone]

## 2018-03-31 LAB — CANCER ANTIGEN 27.29: CA 27.29: 69.5 U/mL — ABNORMAL HIGH (ref 0.0–38.6)

## 2018-04-03 ENCOUNTER — Other Ambulatory Visit: Payer: Self-pay

## 2018-04-03 ENCOUNTER — Ambulatory Visit: Payer: Medicare Other | Attending: General Surgery | Admitting: Physical Therapy

## 2018-04-03 DIAGNOSIS — M25612 Stiffness of left shoulder, not elsewhere classified: Secondary | ICD-10-CM | POA: Insufficient documentation

## 2018-04-03 DIAGNOSIS — Z483 Aftercare following surgery for neoplasm: Secondary | ICD-10-CM | POA: Insufficient documentation

## 2018-04-03 NOTE — Therapy (Signed)
[place], [place], [address] Phone: [phone]   Fax:  [phone]  Physical Therapy Evaluation  Patient Details  Name: Jessica Glenn MRN: [id number] Date of Birth: 07/11/1929 Referring Provider: Dr. Autumn Messing   Encounter Date: 04/03/2018  PT End of Session - 04/03/18 1650    Visit Number  1    Number of Visits  2    Date for PT Re-Evaluation  05/02/18    PT Start Time  1324    PT Stop Time  1645    PT Time Calculation (min)  60 min    Activity Tolerance  Patient tolerated treatment well    Behavior During Therapy  [facility] for tasks assessed/performed       Past Medical History:  Diagnosis Date  . Basal cell carcinoma    s/p Moh's surgery  to left side of nose  . Breast cancer, left breast ([place]) dx'd 05/2017  . History of tobacco use   . Varicose vein of leg    BLE    Past Surgical History:  Procedure Laterality Date  . BREAST BIOPSY Left 05/2017  . CATARACT EXTRACTION W/ INTRAOCULAR LENS  IMPLANT, BILATERAL Bilateral   . DILATION AND CURETTAGE OF UTERUS    . FRACTURE SURGERY    . HARDWARE REMOVAL Right 02/14/2017   Procedure: RIGHT ANKLE HARDWARE REMOVAL;  Surgeon: [name], MD;  Location: [place];  Service: Orthopedics;  Laterality: Right;  . I&D EXTREMITY Right 02/14/2017   Procedure: RIGHT IRRIGATION AND DEBRIDEMENT ANKLE;  Surgeon: [name], MD;  Location: [place];  Service: Orthopedics;  Laterality: Right;  . MASTECTOMY Left 02/14/2018   LEFT MASTECTOMY WITH DEEP LEFT AXILLARY SENTINEL LYMPH NODE BIOPSY   . MASTECTOMY W/ SENTINEL NODE BIOPSY Left 02/14/2018   Procedure: LEFT MASTECTOMY WITH SENTINEL LYMPH NODE BIOPSY AND POSSIBLE NODE DISSECTION;  Surgeon: Jovita Kussmaul, MD;  Location: [place];  Service: General;  Laterality: Left;  . MOHS SURGERY Left    "side of nose"  . ORIF ANKLE FRACTURE Right 02/02/2016   Procedure: OPEN REDUCTION  INTERNAL FIXATION (ORIF) ANKLE FRACTURE;  Surgeon: [name], MD;  Location: [place];  Service: Orthopedics;  Laterality: Right;  strykermini c armreg bedbone foam    There were no vitals filed for this visit.   Subjective Assessment - 04/03/18 1547    Subjective  Surgeon sent her for post-op follow-up. She does report some left flank swelling. I really haven't pushed getting back to normal yet.    Pertinent History  Left breast cancer with mastectomy 02/14/18 with 7 lymph nodes removed; drain was removed around 03/20/18. She is on anastrozole. She expects to have RT to left chest and also to a couple of hot spots on T2 (pt. thinks)--she says they were hot on PET, but it is in a difficult spot to biopsy, so XRT is preferable. She may also chemo, but does not know yet. h/o cataract surgery. h/o left frozen shoulder some years ago, but it resolved completely with PT.    Patient Stated Goals  doctor told me it would help me and I don't want to resist any help; get back to normal    Currently in Pain?  Yes    Pain Score  0-No pain up to 5    Pain Location  Chest    Pain Orientation  Left    Pain Descriptors / Indicators  Discomfort    Pain Type  Surgical pain    Aggravating Factors   when she first turns in bed    Pain Relieving Factors  sleeping in recliner, changing positions         [facility] PT Assessment - 04/03/18 0001      Assessment   Medical Diagnosis  left breast cancer    Referring Provider  Dr. Autumn Messing    Onset Date/Surgical Date  02/14/18    Hand Dominance  Right    Prior Therapy  none      Precautions   Precautions  Other (comment)    Precaution Comments  cancer precautions; POSSIBLE bone met at T2 area      Restrictions   Weight Bearing Restrictions  No      Balance Screen   Has the patient fallen in the past 6 months  No    Has the patient had a decrease in activity level because of a fear of falling?   No    Is the patient reluctant to leave  their home because of a fear of falling?   No      Home Environment   Living Environment  Private residence    Living Arrangements  Spouse/significant other    Type of [place]  Two level;Laundry or work area in basement      Prior Function   Level of Independence  Independent    Leisure  walks 40 minutes 5 days a week used to go 3 miles, but hasn't gotten back to that      Cognition   Overall Cognitive Status  Within Functional Limits for tasks assessed      Observation/Other Assessments   Observations  some puckering and puffiness at areas around left chest incisions    Skin Integrity  mastectomy and node scars mostly healed, but with slight scabbing and/or glue still visible; slight redness at area of SLNB dissection      ROM / Strength   AROM / PROM / Strength  AROM      AROM   AROM Assessment Site  Shoulder    Right/Left Shoulder  Right;Left    Right Shoulder Flexion  150 Degrees    Right Shoulder ABduction  171 Degrees    Right Shoulder Internal Rotation  -- in sitting, WFL    Right Shoulder External Rotation  73 Degrees    Left Shoulder Flexion  118 Degrees    Left Shoulder ABduction  91 Degrees    Left Shoulder Internal Rotation  -- WFL in sitting    Left Shoulder External Rotation  84 Degrees      PROM   PROM Assessment Site  Shoulder    Right/Left Shoulder  Left    Left Shoulder Flexion  128 Degrees    Left Shoulder ABduction  96 Degrees      Palpation   Palpation comment  very limited soft tissue mobility around incision areas; fullness and induration around these areas as well        LYMPHEDEMA/ONCOLOGY QUESTIONNAIRE - 04/03/18 1604      Type   Cancer Type  left breast      Surgeries   Mastectomy Date  02/14/18    Number Lymph Nodes Removed  7 possibly 2 positive      Treatment   Past Chemotherapy Treatment  No but possibly in future    Active Radiation Treatment  -- will have in future  Current Hormone Treatment  Yes    Drug  Name  anastrozole      Lymphedema Assessments   Lymphedema Assessments  Upper extremities      Right Upper Extremity Lymphedema   10 cm Proximal to Olecranon Process  28.4 cm    Olecranon Process  24.3 cm    10 cm Proximal to Ulnar Styloid Process  18.6 cm    Just Proximal to Ulnar Styloid Process  14.8 cm    Across Hand at [employer]  18.1 cm    At [place] of 2nd Digit  6 cm      Left Upper Extremity Lymphedema   10 cm Proximal to Olecranon Process  29 cm    Olecranon Process  25.2 cm    10 cm Proximal to Ulnar Styloid Process  20 cm    Just Proximal to Ulnar Styloid Process  14.8 cm    Across Hand at [employer]  17.3 cm    At [place] of 2nd Digit  5.7 cm             Objective measurements completed on examination: See above findings.              PT Education - 04/03/18 1649    Education Details  about ABC class; left shoulder dowel ROM exercises and doorway stretch; began basic education about lymphedema    Person(s) Educated  Patient    Methods  Explanation;Verbal cues;Handout    Comprehension  Verbalized understanding;Returned demonstration          PT Long Term Goals - 04/03/18 1700      PT LONG TERM GOAL #1   Title  Pt. will be independent in HEP for left shoulder ROM.    Time  2    Period  Weeks    Status  New      PT LONG TERM GOAL #2   Title  Pt. will show left shoulder active flexion to at least 130 degrees for improved overhead reach.    Baseline  118 on eval    Time  2    Period  Weeks    Status  New      PT LONG TERM GOAL #3   Title  Pt. will show left shoulder active abduction to at least 120 degrees for improved ADLs    Baseline  91 on eval    Time  2    Period  Weeks    Status  New             Plan - 04/03/18 1651    Clinical Impression Statement  Pt. is approx. 6 weeks post-op mastectomy for left breast cancer, drain having been removed two weeks ago. She had 7 lymph nodes removed, 2 positive. She expects to  have radiation treatment for incision area and for T2 area due to hot spots seen there on PET, spots that are difficult to biopsy for tissue diagnosis. She has limited left shoulder ROM and strength. She currently has a fairly low risk for lymphedema but this may go up if radiation targets lymphnode bed. Arm circumferences do not indicate a problem today. She learned some stretches today to do at home and was encouraged to attend the next free ABC class. She will return for a follow-up in two weeks to check her progress.  She is a bit hesitant to do more frequent therapy right now as she feels she has had such frequent doctor appointments  of late and will start daily XRT sometime soon.    Clinical Presentation  Evolving    Clinical Presentation due to:  still recovering from surgery and having drains removed just two weeks ago    Clinical Decision Making  Low    Rehab Potential  Good    Clinical Impairments Affecting Rehab Potential  to start XRT soon    PT Frequency  Other (comment) follow-up in two weeks    PT Treatment/Interventions  ADLs/Self Care Home Management;DME Instruction;Therapeutic exercise;Patient/family education;Manual techniques;Manual lymph drainage;Scar mobilization;Passive range of motion    PT Next Visit Plan  Pt. will return in two weeks for [place] to check her progress. Remeasure left shoulder AROM; check incision and soft tissue mobility there; see if she has managed to get into position for radiation; see if she has or can come to ABC class.    PT Home Exercise Plan  standing dowel left shoulder flexion and abduction, doorway stretches    Recommended Other Services  ABC class    Consulted and Agree with Plan of Care  Patient       Patient will benefit from skilled therapeutic intervention in order to improve the following deficits and impairments:  Decreased range of motion, Impaired UE functional use, Decreased knowledge of precautions, Decreased knowledge of use of DME,  Decreased scar mobility  Visit Diagnosis: Stiffness of left shoulder, not elsewhere classified - Plan: PT plan of care cert/re-cert  Aftercare following surgery for neoplasm - Plan: PT plan of care cert/re-cert     Problem List Patient Active Problem List   Diagnosis Date Noted  . Aortic atherosclerosis ([place]) 03/30/2018  . Breast cancer of upper-outer quadrant of left female breast ([place]) 02/14/2018  . Malignant neoplasm of overlapping sites of left breast in female, estrogen receptor positive ([place]) 06/29/2017  . Locally advanced carcinoma of breast, left ([place]) 06/29/2017  . Vitamin D insufficiency 01/28/2016  . Ankle fracture, right 01/27/2016  . Hyperglycemia 01/27/2016  . Fall (on) (from) other stairs and steps, initial encounter 01/27/2016  . History of basal cell carcinoma of skin 01/27/2016    SALISBURY,DONNA 04/03/2018, 5:04 PM  [address], [place], [address] Phone: [phone]   Fax:  [phone]  Name: OLEVIA WESTERVELT MRN: [id number] Date of Birth: 17-Apr-1929  Serafina Royals, PT 04/03/18 5:04 PM

## 2018-04-03 NOTE — Patient Instructions (Signed)

## 2018-04-13 ENCOUNTER — Other Ambulatory Visit: Payer: Self-pay | Admitting: *Deleted

## 2018-04-13 ENCOUNTER — Telehealth: Payer: Self-pay | Admitting: *Deleted

## 2018-04-13 DIAGNOSIS — C50812 Malignant neoplasm of overlapping sites of left female breast: Secondary | ICD-10-CM

## 2018-04-13 DIAGNOSIS — Z170 Estrogen receptor positive status [ER+]: Principal | ICD-10-CM

## 2018-04-13 NOTE — Telephone Encounter (Signed)
This RN received VM from pt stating " I am waiting to have an appointment with the radiation doctor - I have not heard anything"  This RN reviewed MD dictation and noted need for radiation with chemo sensitization.  No noted referral - this RN placed new referral - and left a message on VM of Education officer, museum for [employer].  Pt contacted and informed request is in process and she should hear about an appointment on Monday. This RN asked her to call if she does not receive a call regarding the appointment early next week.  This note will be forwarded to MD for further follow up.

## 2018-04-16 ENCOUNTER — Other Ambulatory Visit: Payer: Self-pay | Admitting: Oncology

## 2018-04-16 ENCOUNTER — Encounter: Payer: Self-pay | Admitting: Radiation Oncology

## 2018-04-17 ENCOUNTER — Ambulatory Visit: Payer: Medicare Other | Admitting: Physical Therapy

## 2018-04-17 DIAGNOSIS — M25612 Stiffness of left shoulder, not elsewhere classified: Secondary | ICD-10-CM | POA: Diagnosis not present

## 2018-04-17 DIAGNOSIS — Z483 Aftercare following surgery for neoplasm: Secondary | ICD-10-CM

## 2018-04-17 NOTE — Therapy (Addendum)
[place], [place], [address] Phone: [phone]   Fax:  [phone]  Physical Therapy Treatment  Patient Details  Name: Jessica Glenn MRN: [id number] Date of Birth: 1929/06/07 Referring Provider: Dr. Autumn Messing   Encounter Date: 04/17/2018  PT End of Session - 04/17/18 1643    Visit Number  2    Number of Visits  3    Date for PT Re-Evaluation  05/02/18    PT Start Time  1430    PT Stop Time  1515    PT Time Calculation (min)  45 min    Activity Tolerance  Patient tolerated treatment well    Behavior During Therapy  [facility] for tasks assessed/performed       Past Medical History:  Diagnosis Date  . Basal cell carcinoma    s/p Moh's surgery  to left side of nose  . Breast cancer, left breast ([place]) dx'd 05/2017  . History of tobacco use   . Varicose vein of leg    BLE    Past Surgical History:  Procedure Laterality Date  . BREAST BIOPSY Left 05/2017  . CATARACT EXTRACTION W/ INTRAOCULAR LENS  IMPLANT, BILATERAL Bilateral   . DILATION AND CURETTAGE OF UTERUS    . FRACTURE SURGERY    . HARDWARE REMOVAL Right 02/14/2017   Procedure: RIGHT ANKLE HARDWARE REMOVAL;  Surgeon: [name], MD;  Location: [place];  Service: Orthopedics;  Laterality: Right;  . I&D EXTREMITY Right 02/14/2017   Procedure: RIGHT IRRIGATION AND DEBRIDEMENT ANKLE;  Surgeon: [name], MD;  Location: [place];  Service: Orthopedics;  Laterality: Right;  . MASTECTOMY Left 02/14/2018   LEFT MASTECTOMY WITH DEEP LEFT AXILLARY SENTINEL LYMPH NODE BIOPSY   . MASTECTOMY W/ SENTINEL NODE BIOPSY Left 02/14/2018   Procedure: LEFT MASTECTOMY WITH SENTINEL LYMPH NODE BIOPSY AND POSSIBLE NODE DISSECTION;  Surgeon: Jovita Kussmaul, MD;  Location: [place];  Service: General;  Laterality: Left;  . MOHS SURGERY Left    "side of nose"  . ORIF ANKLE FRACTURE Right 02/02/2016   Procedure: OPEN REDUCTION  INTERNAL FIXATION (ORIF) ANKLE FRACTURE;  Surgeon: [name], MD;  Location: [place];  Service: Orthopedics;  Laterality: Right;  strykermini c armreg bedbone foam    There were no vitals filed for this visit.  Subjective Assessment - 04/17/18 1431    Subjective  I think I'm doing well. I'll meet with the radiation oncologist tomorrow. Has been doing the stretches that we went over. Has not attended ABC class yet.    Pertinent History  Left breast cancer with mastectomy 02/14/18 with 7 lymph nodes removed; drain was removed around 03/20/18. She is on anastrozole. She expects to have RT to left chest and also to a couple of hot spots on T2 (pt. thinks)--she says they were hot on PET, but it is in a difficult spot to biopsy, so XRT is preferable. She may also chemo, but does not know yet. h/o cataract surgery. h/o left frozen shoulder some years ago, but it resolved completely with PT.    Currently in Pain?  No/denies         [facility] PT Assessment - 04/17/18 0001      Observation/Other Assessments   Skin Integrity  incision healed now      AROM   Left Shoulder Flexion  135 Degrees    Left Shoulder ABduction  107 Degrees  Palpation   Palpation comment  still with limited soft tissue mobility especially inferior to incision                   [facility] Adult PT Treatment/Exercise - 04/17/18 0001      Shoulder Exercises: Standing   ABduction  AAROM;Left wall walking instructed today for HEP      Shoulder Exercises: Pulleys   Flexion  2 minutes    ABduction  2 minutes      Shoulder Exercises: Therapy Ball   Flexion  Both;5 reps      Manual Therapy   Manual Therapy  Soft tissue mobilization;Passive ROM    Soft tissue mobilization  scar mobilization to left chest area and instructed patient in doing this herself    Passive ROM  in supine to left shoulder with stretch to patient tolerance into er, abduction, flexion, and positioning for radiation              PT Education - 04/17/18 1507    Education Details  wall walking for left shoulder abduction; scar mobilization of left mastectomy scar    Person(s) Educated  Patient    Methods  Explanation;Demonstration;Tactile cues;Verbal cues;Handout    Comprehension  Verbalized understanding;Need further instruction          PT Long Term Goals - 04/17/18 1440      PT LONG TERM GOAL #1   Title  Pt. will be independent in HEP for left shoulder ROM.    Status  Partially Met      PT LONG TERM GOAL #2   Title  Pt. will show left shoulder active flexion to at least 130 degrees for improved overhead reach.    Baseline  118 on eval; 135 on 04/17/18    Status  Achieved      PT LONG TERM GOAL #3   Title  Pt. will show left shoulder active abduction to at least 120 degrees for improved ADLs    Baseline  91 on eval; 106 on 04/17/18    Status  Partially Met            Plan - 04/17/18 1643    Clinical Impression Statement  Pt. has made nice gains in flexion and abduction AROM of left shoulder, but not as much as would be desired and she has not met her goal for abduction yet. She is to have radiation oncology appointment tomorrow; it appears she will be able to get into position for radiation treatment, but perhaps not comfortable. She was shown additional exercise today for shoulder abduction stretch and was shown how to do self-scar mobilization. Plan to see her back in two weeks to make sure she is continuing to progress.    Rehab Potential  Good    Clinical Impairments Affecting Rehab Potential  to start XRT soon    PT Frequency  -- follow-up one more time in two weeks    PT Treatment/Interventions  ADLs/Self Care Home Management;DME Instruction;Therapeutic exercise;Patient/family education;Manual techniques;Manual lymph drainage;Scar mobilization;Passive range of motion    PT Next Visit Plan  Pt. will return in two weeks for [place] to check her progress. Remeasure left shoulder AROM;  check incision and soft tissue mobility there; see if she has managed to get into position for radiation; see if she has or can come to ABC class.    PT Home Exercise Plan  standing dowel left shoulder flexion and abduction, doorway stretches, wall walking with left hand, self-scar mobilization  Consulted and Agree with Plan of Care  Patient       Patient will benefit from skilled therapeutic intervention in order to improve the following deficits and impairments:  Decreased range of motion, Impaired UE functional use, Decreased knowledge of precautions, Decreased knowledge of use of DME, Decreased scar mobility  Visit Diagnosis: Stiffness of left shoulder, not elsewhere classified - Plan: PT plan of care cert/re-cert  Aftercare following surgery for neoplasm - Plan: PT plan of care cert/re-cert     Problem List Patient Active Problem List   Diagnosis Date Noted  . Aortic atherosclerosis ([place]) 03/30/2018  . Breast cancer of upper-outer quadrant of left female breast ([place]) 02/14/2018  . Malignant neoplasm of overlapping sites of left breast in female, estrogen receptor positive ([place]) 06/29/2017  . Locally advanced carcinoma of breast, left ([place]) 06/29/2017  . Vitamin D insufficiency 01/28/2016  . Ankle fracture, right 01/27/2016  . Hyperglycemia 01/27/2016  . Fall (on) (from) other stairs and steps, initial encounter 01/27/2016  . History of basal cell carcinoma of skin 01/27/2016    SALISBURY,DONNA 04/17/2018, 4:49 PM  [place], [place], [address] Phone: [phone]   Fax:  [phone]  Name: Jessica Glenn MRN: [id number] Date of Birth: Aug 11, 1929  Serafina Royals, PT 04/17/18 4:49 PM  PHYSICAL THERAPY DISCHARGE SUMMARY  Visits from Start of Care: 2  Current functional level related to goals / functional outcomes: Goals partially met as noted above.   Remaining deficits: Unknown. Patient  was to return for one more treatment including discharge assessment, but she did not return.   Education / Equipment: Home exercise program. Plan: Patient agrees to discharge.  Patient goals were partially met. Patient is being discharged due to not returning since the last visit.  ?????   Serafina Royals, PT 07/17/18 12:12 PM

## 2018-04-17 NOTE — Patient Instructions (Signed)
1) Stand with left shoulder toward the wall. Place fingers on wall and "walk" fingers up the wall until you feel a stretch at the left underarm or shoulder area. You may need to scoot in closer to the wall as you raise the arm higher to get a good stretch. Hold for 10 seconds, and do 5-10 repetitions twice a day.  2) Place your right hand near your mastectomy scar. Keep your hand "glued" to the skin, and then move your hand in every direction, feeling like you are stretching or loosening the scar as you do.  Especially work in the directions that feel tight. Do this for several minutes a day.

## 2018-04-17 NOTE — Progress Notes (Signed)
Location of Breast Cancer: Malignant neoplasm of Upper outer quadrant of left breast, estrogen receptor positive  Did patient present with symptoms (if so, please note symptoms) or was this found on screening mammography?:  Patient noticed some breast changes about a year and a half ago.    Bilateral Mammogram 06/19/2017: Found breast density to be category C.  In the upper outer retro-areolar left breast there was an irregular mass estimated to be at least 6 cm by mammography.  There were heterogenous calcifications within the mass.  There was skin thickening of the areola and an enlarged inferior left axillary lymph node was noted.    Staging CT Chest/ Bone Survey 07/11/2017: multiple indeterminant subcentimeter pulmonary nodules.  Indeterminant right hepatic lobe lesion, but no definite evidence of malignancy.  On exam there was a large fixed hard mass in the upper outer quadrant of the left breast causing protrusion of the areola.  It measured approximately 7 cm by palpation and there was visible skin thickening.  In the inferior left axilla there was a firm palpable mass measuring 2 cm.  Ultrasound: irregular hypoechoic vascular mass in the left breast centered on the 1:00 position 4 cm from the nipple measuring 6.7 cm.  This was abutting the overlying skin.  It was a 2.3 cm hypoechoic vascular mass in the left axilla.  The right breast was unremarkable.  MRI Spine 03/14/2018: Unchanged size of T2 vertebral body metastasis.  No epidural tumor. 4 mm enhancing lesion in the T8 vertebral body, indeterminate.  No evidence of metastatic disease in the cervical or lumbar spine.  PET 03/29/2018: No additional suspicious areas that might be amenable to biopsy.  The 2 spinal lesions do not seem to show a high SUV.    Histology per Pathology Report: Left Breast 06/22/2017   Receptor Status: ER(+ 100%), PR (+ 50%), Her2-neu (-), Ki-67(15%)   Past/Anticipated interventions by surgeon, if any: Left  mastectomy and sentinel lymph node sampling 02/14/2018. 2/7 removed lymph nodes were positive.  Negative margins  Past/Anticipated interventions by medical oncology, if any: Chemotherapy  Dr. Jana Hakim 03/30/2018 -She understands that even if she did not have a T8 lesion, she is at high risk of having microscopic disseminated disease because her tumor was large and lymph nodes were positive.  For the same reason she is going to need adjuvant radiation to the chest wall and draining lymph node areas. -I would also recommend radiation to the T2 lesion, even in the absence of biopsy-proven metastatic disease.  She will discuss that with radiation oncology and hopefully they will see her in the next few days to begin treatment planning. -I think she will benefit from capecitabine sensitization during radiation and I will discuss that with radiation oncology once she has been assigned a radiation oncologist -The other question regarding systemic therapy is whether or not to add anastrozole at this point.  She does have a [place] [id number] which is informative and it is dropping.  In the absence of any active disease at present by my reading of the PET scan I would favor continuing anastrozole until either the CA-27-29 starts to rise or there is evidence of disease progression.  She is very agreeable with this plan, which I wrote out for her.  (1) neo-adjuvant fulvestrant started on 07/05/2017, discontinued 12/20/2017 with possible progression  (2) status post left mastectomy and sentinel lymph node sampling 02/14/2018 for a T3 N1, stage IIA invasive ductal carcinoma, grade 1, with negative margins.             (  a) 2 of 7 removed lymph nodes were positive  (3) adjuvant radiation to follow surgery  (4) anastrozole started 01/17/2018             (a) start palbociclib once disease progression is documented  (5) METASTATIC DISEASE:              (a) CT scan of the chest 02/05/2018 shows a new lesion at T2              (b) total spinal MRI 03/14/2018 confirms lesion at T2 and possible minimal lesion at T8, neither amenable to biopsy; there are no other suspicious lesions  (6) referral to radiation oncology placed 03/30/2018   Lymphedema issues, if any:  None at this time.  Patient has been to PT and determined its just soft tissue.  Numbness under the arm is starting to improve.  Pain issues, if any: Some tenderness and soreness under the arm.  BP (!) 162/75   Pulse 63   Temp 98.3 F (36.8 C) (Oral)   Resp 17   Ht _0  (1.651 m)   Wt 152 lb (68.9 kg)   SpO2 100%   BMI 25.29 kg/m    Wt Readings from Last 3 Encounters:  04/18/18 152 lb (68.9 kg)  03/30/18 153 lb 8 oz (69.6 kg)  02/23/18 149 lb 9.6 oz (67.9 kg)   SAFETY ISSUES:  Prior radiation? No  Pacemaker/ICD? No  Possible current pregnancy? No  Is the patient on methotrexate? No  Current Complaints / other details:  Patient takes anastrozole.    Cori Razor, RN 04/17/2018,12:27 PM

## 2018-04-18 ENCOUNTER — Ambulatory Visit
Admission: RE | Admit: 2018-04-18 | Discharge: 2018-04-18 | Disposition: A | Discharge status: Home / Self Care | Payer: Medicare Other | Source: Ambulatory Visit | Attending: Radiation Oncology | Admitting: Radiation Oncology

## 2018-04-18 ENCOUNTER — Other Ambulatory Visit: Payer: Self-pay

## 2018-04-18 ENCOUNTER — Encounter: Payer: Self-pay | Admitting: Radiation Oncology

## 2018-04-18 VITALS — BP 162/75 | HR 63 | Temp 98.3°F | Resp 17 | Ht 65.0 in | Wt 152.0 lb

## 2018-04-18 DIAGNOSIS — Z9012 Acquired absence of left breast and nipple: Secondary | ICD-10-CM | POA: Diagnosis not present

## 2018-04-18 DIAGNOSIS — C50812 Malignant neoplasm of overlapping sites of left female breast: Secondary | ICD-10-CM

## 2018-04-18 DIAGNOSIS — Z170 Estrogen receptor positive status [ER+]: Principal | ICD-10-CM

## 2018-04-18 DIAGNOSIS — C50412 Malignant neoplasm of upper-outer quadrant of left female breast: Secondary | ICD-10-CM

## 2018-04-18 DIAGNOSIS — Z87891 Personal history of nicotine dependence: Secondary | ICD-10-CM | POA: Diagnosis not present

## 2018-04-18 DIAGNOSIS — L821 Other seborrheic keratosis: Secondary | ICD-10-CM | POA: Diagnosis not present

## 2018-04-18 DIAGNOSIS — M488X4 Other specified spondylopathies, thoracic region: Secondary | ICD-10-CM | POA: Diagnosis not present

## 2018-04-18 DIAGNOSIS — Z85828 Personal history of other malignant neoplasm of skin: Secondary | ICD-10-CM | POA: Insufficient documentation

## 2018-04-18 DIAGNOSIS — Z79899 Other long term (current) drug therapy: Secondary | ICD-10-CM | POA: Insufficient documentation

## 2018-04-18 NOTE — Progress Notes (Signed)
Radiation Oncology         [phone] ________________________________  Name: Jessica Glenn        MRN: [id number]  Date of Service: 04/18/2018 DOB: [birth year]  HY:QMVHQ, Herbie Baltimore, MD  Magrinat, Virgie Dad, MD     REFERRING PHYSICIAN: Magrinat, Virgie Dad, MD   DIAGNOSIS: The primary encounter diagnosis was Malignant neoplasm of upper-outer quadrant of left breast in female, estrogen receptor positive ([place]). A diagnosis of Malignant neoplasm of overlapping sites of left breast in female, estrogen receptor positive ([place]) was also pertinent to this visit.   HISTORY OF PRESENT ILLNESS: Jessica Glenn is a 84 y.o. female seen in the multidisciplinary breast clinic for a new diagnosis of left breast cancer. The patient was noted to have a palpable mass in August 2018 that led to further work up. She was found to have at least stage II disease, possibly stage III disease due to N1-2 nodal involvement. Her tumor was 67 mm by ultrasound and in the left axilla there was a mass measuring 23 mm. On 06/22/17 a biopsy of both the breast and axillary lesion to be a grade 2 ER/PR positive, HER2 negative invasive ductal carcinoma. She went on to received neoadjuvant Fulvestrant between September 2018 and February 2019. The medication was discontinued due to concerns for progression clinically and she continued with antiestrogen therapy. A CT on 02/05/18 revealed her axillary node measuring 15 x 17 mm, improved from 26 x 19 mm, and her breast mass measured 43 x 48 mm, prior it was 50 x 48 mm. She did have a lesion at T2 of concern for possible disease. She met with Dr. Marlou Starks and underwent left mastectomy with targeted dissesection on 02/14/18. Final pathology revealed a grade 1, 6.1 cm invasive ductal carcinoma. 2/7 nodes were positive, and her margins were negative. She did have repeat imaging and an MRI of the thoracic spine on 03/14/18 again confirmed enhancement at T2 as well as T8. A PET scan on 03/29/18 revealed activity in T2  and nonspecific pulmonary nodules. No additional concerns for metastatic disease were noted. She comes today to discuss options of adjuvant radiotherapy to the chest wall, regional nodes, and T2 lesion.    PREVIOUS RADIATION THERAPY: No   PAST MEDICAL HISTORY:  Past Medical History:  Diagnosis Date  . Basal cell carcinoma    s/p Moh's surgery  to left side of nose  . Breast cancer, left breast ([place]) dx'd 05/2017  . History of tobacco use   . Varicose vein of leg    BLE       PAST SURGICAL HISTORY: Past Surgical History:  Procedure Laterality Date  . BREAST BIOPSY Left 05/2017  . CATARACT EXTRACTION W/ INTRAOCULAR LENS  IMPLANT, BILATERAL Bilateral   . DILATION AND CURETTAGE OF UTERUS    . FRACTURE SURGERY    . HARDWARE REMOVAL Right 02/14/2017   Procedure: RIGHT ANKLE HARDWARE REMOVAL;  Surgeon: [name], MD;  Location: [facility];  Service: Orthopedics;  Laterality: Right;  . I&D EXTREMITY Right 02/14/2017   Procedure: RIGHT IRRIGATION AND DEBRIDEMENT ANKLE;  Surgeon: [name], MD;  Location: [facility];  Service: Orthopedics;  Laterality: Right;  . MASTECTOMY Left 02/14/2018   LEFT MASTECTOMY WITH DEEP LEFT AXILLARY SENTINEL LYMPH NODE BIOPSY   . MASTECTOMY W/ SENTINEL NODE BIOPSY Left 02/14/2018   Procedure: LEFT MASTECTOMY WITH SENTINEL LYMPH NODE BIOPSY AND POSSIBLE NODE DISSECTION;  Surgeon: Jovita Kussmaul, MD;  Location: [facility];  Service: General;  Laterality: Left;  . MOHS SURGERY Left    "side of nose"  . ORIF ANKLE FRACTURE Right 02/02/2016   Procedure: OPEN REDUCTION INTERNAL FIXATION (ORIF) ANKLE FRACTURE;  Surgeon: [name], MD;  Location: [facility];  Service: Orthopedics;  Laterality: Right;  strykermini c armreg bedbone foam     FAMILY HISTORY:  Family History  Problem Relation Age of Onset  . COPD Mother   . Emphysema Mother      SOCIAL HISTORY:  reports that she quit smoking about 59 years  ago. Her smoking use included cigarettes. She has a 8.00 pack-year smoking history. She has never used smokeless tobacco. She reports that she drinks about 3.0 oz of alcohol per week. She reports that she does not use drugs.   ALLERGIES: Patient has no known allergies.   MEDICATIONS:  Current Outpatient Medications  Medication Sig Dispense Refill  . anastrozole (ARIMIDEX) 1 MG tablet Take 1 tablet (1 mg total) by mouth daily. (Patient taking differently: Take 1 mg by mouth every evening. ) 90 tablet 4  . ascorbic acid (VITAMIN C) 1000 MG tablet Take 1,000 mg by mouth daily.    . Calcium Carb-Cholecalciferol (CALCIUM 600 + D PO) Take 1 tablet by mouth daily.    . Calcium-Magnesium-Zinc (CAL-MAG-ZINC PO) Take 1 tablet by mouth daily.    . Cholecalciferol (VITAMIN D3) 3000 units TABS Take 3,000 Units by mouth daily.    . Cyanocobalamin (B-12) 5000 MCG CAPS Take 5,000 mcg by mouth daily.    . diphenhydramine-acetaminophen (TYLENOL PM) 25-500 MG TABS tablet Take 1-2 tablets by mouth at bedtime.     Marland Kitchen doxylamine, Sleep, (UNISOM) 25 MG tablet Take 25 mg by mouth at bedtime as needed for sleep.    . Multiple Vitamin (MULTIVITAMIN WITH MINERALS) TABS tablet Take 1 tablet by mouth daily.    . Probiotic CAPS Take 1 capsule by mouth daily.    . Red Yeast Rice Extract (RED YEAST RICE PO) Take 1,200 mg by mouth 3 (three) times a week.    Marland Kitchen VITAMIN E PO Take 180 Units by mouth daily.     No current facility-administered medications for this encounter.      REVIEW OF SYSTEMS: On review of systems, the patient reports that she is doing well overall. She denies any chest pain, shortness of breath, cough, fevers, chills, night sweats, unintended weight changes. She denies any bowel or bladder disturbances, and denies abdominal pain, nausea or vomiting. She denies any new musculoskeletal or joint aches or pains. A complete review of systems is obtained and is otherwise negative.     PHYSICAL EXAM:  Wt  Readings from Last 3 Encounters:  04/18/18 152 lb (68.9 kg)  03/30/18 153 lb 8 oz (69.6 kg)  02/23/18 149 lb 9.6 oz (67.9 kg)   Temp Readings from Last 3 Encounters:  04/18/18 98.3 F (36.8 C) (Oral)  03/30/18 98.4 F (36.9 C) (Oral)  02/23/18 98.6 F (37 C) (Oral)   BP Readings from Last 3 Encounters:  04/18/18 (!) 162/75  03/30/18 (!) 152/67  02/23/18 130/65   Pulse Readings from Last 3 Encounters:  04/18/18 63  03/30/18 68  02/23/18 68     In general this is a well appearing caucasian female in no acute distress. She is alert and oriented x4 and appropriate throughout the examination. HEENT reveals that the patient is normocephalic, atraumatic. EOMs are intact. PERRLA. Skin is intact without any  evidence of gross lesions. Cardiopulmonary assessment is negative for acute distress and she exhibits normal effort. The left chest wall is intact with a well healed mastectomy scar. She has multiple seborrheic keratoses. She does not have any erythema. No palpable mass is noted with evaluating the mid back.   ECOG = 1 0 - Asymptomatic (Fully active, able to carry on all predisease activities without restriction)  1 - Symptomatic but completely ambulatory (Restricted in physically strenuous activity but ambulatory and able to carry out work of a light or sedentary nature. For example, light housework, office work)  2 - Symptomatic, <50% in bed during the day (Ambulatory and capable of all self care but unable to carry out any work activities. Up and about more than 50% of waking hours)  3 - Symptomatic, >50% in bed, but not bedbound (Capable of only limited self-care, confined to bed or chair 50% or more of waking hours)  4 - Bedbound (Completely disabled. Cannot carry on any self-care. Totally confined to bed or chair)  5 - Death   Eustace Pen MM, Creech RH, Tormey DC, et al. [phone]). "Toxicity and response criteria of the [facility]". [place] Oncol. 5 (6):  649-55    LABORATORY DATA:  Lab Results  Component Value Date   WBC 4.3 03/30/2018   HGB 13.2 03/30/2018   HCT 39.7 03/30/2018   MCV 90.8 03/30/2018   PLT 192 03/30/2018   Lab Results  Component Value Date   NA 142 03/30/2018   K 4.8 03/30/2018   CL 105 03/30/2018   CO2 27 03/30/2018   Lab Results  Component Value Date   ALT 9 03/30/2018   AST 18 03/30/2018   ALKPHOS 62 03/30/2018   BILITOT 0.4 03/30/2018      RADIOGRAPHY: Nm Pet Image Initial (pi) Skull Base To Thigh  Result Date: 03/30/2018 CLINICAL DATA:  Initial treatment strategy for metastatic left breast cancer. EXAM: NUCLEAR MEDICINE PET SKULL BASE TO THIGH TECHNIQUE: 7.7 mCi F-18 FDG was injected intravenously. Full-ring PET imaging was performed from the skull base to thigh after the radiotracer. CT data was obtained and used for attenuation correction and anatomic localization. Fasting blood glucose: 90 mg/dl COMPARISON:  Chest CT 02/05/2018. MRI of the thoracic spine of 03/14/2018. FINDINGS: Mediastinal blood pool activity: SUV max 2.2 NECK: No areas of abnormal hypermetabolism. Incidental CT findings: No cervical adenopathy. Bilateral carotid atherosclerosis. Right greater than left maxillary sinus mucosal thickening. CHEST: Interval left mastectomy with axillary node dissection. Postoperative hypermetabolism of the left chest wall. Left axillary low-density collection of 2.1 cm is non hypermetabolic, including on image 62/4. There is a low left axillary/lateral chest wall 1.9 cm low-density collection which is also and not hypermetabolic. Incidental CT findings: Aortic and branch vessel atherosclerosis. Mild cardiomegaly, accentuated by a pectus excavatum deformity.Relatively similar scattered bilateral pulmonary nodules. A left apical ground-glass nodule is similar at 8 mm on image 10/8. ABDOMEN/PELVIS: No abdominopelvic parenchymal or nodal hypermetabolism. Incidental CT findings: Right hepatic lobe cyst. Normal adrenal  glands. Abdominal aortic atherosclerosis. Pelvic floor laxity. Left adnexal/ovarian 3.9 cm low-density lesion on image 165/4. SKELETON: Hypermetabolism within the posterior aspect of the T2 vertebral body measures a S.U.V. max of 3.9. Incidental CT findings: none IMPRESSION: 1. Interval left mastectomy and axillary node dissection. Left axillary and chest wall low-density non FDG avid structures are likely postoperative seromas. 2. Isolated T2 osseous metastasis, as on prior CT and MRI. 3. Nonspecific bilateral pulmonary nodules, including a left  apical ground-glass nodule. Below PET resolution. 4. Non FDG avid low-density left adnexal/ovarian lesion is most likely benign cyst. Consider further evaluation with pelvic ultrasound. Electronically Signed   By: Abigail Miyamoto M.D.   On: 03/30/2018 10:14       IMPRESSION/PLAN: 1. Progressive Stage II-III, grade 1, ER/PR positive invasive ductal carcinoma of the left breast. Dr. Lisbeth Renshaw discusses the pathology findings and reviews the nature of locally advanced breast disease and oligometastatic disease. We discussed the rationale or radiotherapy now that she has completed her systemic therapy and surgical resection. She is ready to proceed with  external radiotherapy to the breast followed by antiestrogen therapy. We discussed the risks, benefits, short, and long term effects of radiotherapy, and the patient is interested in proceeding. Dr. Lisbeth Renshaw discusses the delivery and logistics of radiotherapy and anticipates a course of 6 1/2 weeks of radiotherapy to the chest wall and regional nodes as well as Stereotactic Radiosurgery to the T2 location. Written consent is obtained and placed in the chart, a copy was provided to the patient. 2. T2 lesion secondary to #1. As above, Dr. Lisbeth Renshaw feels this would be a good site to target with stereotactic radiotherapy given that it is the only site of active disease on PET imaging. We will contact her to set this portion of the  treatment up as well.   In a visit lasting 60 minutes, greater than 50% of the time was spent face to face discussing her case, and coordinating the patient's care.   The above documentation reflects my direct findings during this shared patient visit. Please see the separate note by Dr. Lisbeth Renshaw on this date for the remainder of the patient's plan of care.    Carola Rhine, PAC

## 2018-04-20 ENCOUNTER — Telehealth: Payer: Self-pay | Admitting: Pharmacist

## 2018-04-20 ENCOUNTER — Other Ambulatory Visit: Payer: Self-pay | Admitting: Oncology

## 2018-04-20 DIAGNOSIS — C50412 Malignant neoplasm of upper-outer quadrant of left female breast: Secondary | ICD-10-CM

## 2018-04-20 MED ORDER — CAPECITABINE 500 MG PO TABS: ORAL_TABLET | ORAL | 1 refills | Status: DC

## 2018-04-20 MED ORDER — CAPECITABINE 500 MG PO TABS: ORAL_TABLET | ORAL | 0 refills | Status: DC

## 2018-04-20 NOTE — Telephone Encounter (Signed)
Oral Oncology Pharmacist Encounter  Received new prescription for Xeloda (capecitabine) for the treatment of breast cancer in conjunction with radiation, planned duration 6 1/2 week course of chemoradiation.  Labs from 03/30/18 assessed, OK for treatment.  Current medication list in Epic reviewed, no significant DDIs with Xeloda identified.  Prescription has been e-scribed to the [facility] for benefits analysis and approval. Test claim at the pharmacy revealed $134 for entire treatment course. This will be fully covered by [place] already secured by this office.  Oral Oncology Clinic will continue to follow for initial counseling and start date.  Johny Drilling, PharmD, BCPS, BCOP  04/20/2018 2:31 PM Oral Oncology Clinic [phone]

## 2018-04-20 NOTE — Telephone Encounter (Signed)
Oral Chemotherapy Pharmacist Encounter   I spoke with patient for overview of: Xeloda.   Counseled patient on administration, dosing, side effects, monitoring, drug-food interactions, safe handling, storage, and disposal.  Patient will take Xeloda 500mg  tablets, 2 tablets (1000mg ) by mouth in AM and 2 tabs (1000mg ) by mouth in PM, within 30 minutes of finishing meals, on days of radiation only.  Xeloda and radiation start date: planned for 04/30/18  Adverse effects of Xeloda include but are not limited to: fatigue, decreased blood counts, GI upset, diarrhea, and hand-foot syndrome.  Patient has anti-emetic on hand and knows to take it if nausea develops.   Patient will obtain anti diarrheal and alert the office of 4 or more loose stools above baseline.   Reviewed with patient importance of keeping a medication schedule and plan for any missed doses.  Ms. Weyman voiced understanding and appreciation.   All questions answered.  Will follow-up face to face with patient at office visit on 04/26/18 with Wilber Bihari, NP. I will arrange either pick-up at the [facility] or shipment from the pharmacy to patient's home at that time.  Medication reconciliation performed and medication/allergy list updated.  Patient knows to call the office with questions or concerns. Oral Oncology Clinic will continue to follow.  Thank you,  Johny Drilling, PharmD, BCPS, BCOP  04/20/2018  3:24 PM Oral Oncology Clinic [phone]

## 2018-04-20 NOTE — Progress Notes (Unsigned)
Radiation oncology, Dr. Lisbeth Renshaw, is in agreement with capecitabine sensitization.  We will start 1000 mg twice daily with radiation, which is scheduled to start 04/30/2018.

## 2018-04-25 ENCOUNTER — Ambulatory Visit
Admission: RE | Admit: 2018-04-25 | Discharge: 2018-04-25 | Disposition: A | Discharge status: Home / Self Care | Payer: Medicare Other | Source: Ambulatory Visit | Attending: Radiation Oncology | Admitting: Radiation Oncology

## 2018-04-25 DIAGNOSIS — Z510 Encounter for antineoplastic radiation therapy: Secondary | ICD-10-CM | POA: Diagnosis present

## 2018-04-25 DIAGNOSIS — C50412 Malignant neoplasm of upper-outer quadrant of left female breast: Secondary | ICD-10-CM | POA: Diagnosis not present

## 2018-04-25 NOTE — Progress Notes (Signed)
  Radiation Oncology         [phone] ________________________________  Name: Jessica Glenn MRN: [id number]  Date: 04/25/2018  DOB: [birth year]  Diagnosis DIAGNOSIS:     ICD-10-CM   1. Malignant neoplasm of upper-outer quadrant of left female breast, unspecified estrogen receptor status ([name]) C50.412      SIMULATION AND TREATMENT PLANNING NOTE  The patient presented for simulation prior to beginning her course of radiation treatment for her diagnosis of left-sided breast cancer. The patient was placed in a supine position on a breast board. A customized vac-lock bag was also constructed and this complex treatment device will be used on a daily basis during her treatment. In this fashion, a CT scan was obtained through the chest area and an isocenter was placed near the chest wall at the upper aspect of the right chest. A breath-hold technique has also been evaluated to determine if this significantly improves the spatial relationship between the target region and the heart. Based on this analysis, a breath-hold technique has been ordered for the patient's treatment.  The patient will be planned to receive a course of radiation initially to a dose of 50.4 gray. This will consist of a 4 field technique targeting the left chest wall as well as the supraclavicular region. Therefore 2 customized medial and lateral tangent fields have been created targeting the chest wall, and also 2 additional customized fields have been designed to treat the supraclavicular region both with a left supraclavicular field and a left posterior axillary boost field. A forward planning/reduced field technique will also be evaluated to determine if this significantly improves the dose homogeneity of the overall plan. Therefore, additional customized blocks/fields may be necessary.  This initial treatment will be accomplished at 1.8 gray per fraction.   The initial plan will consist of a 3-D conformal technique. The target  volume/scar, heart and lungs have been contoured and dose volume histograms of each of these structures will be evaluated as part of the 3-D conformal treatment planning process.   It is anticipated that the patient will then receive a 10 gray boost to the surgical scar. This will be accomplished at 2 gray per fraction. The final anticipated total dose therefore will correspond to 60.4 gray.  Special treatment procedure was performed today due to the extra time and effort required by myself to plan and prepare this patient for deep inspiration breath hold technique.  I have determined cardiac sparing to be of benefit to this patient to prevent long term cardiac damage due to radiation of the heart.  Bellows were placed on the patient's abdomen. To facilitate cardiac sparing, the patient was coached by the radiation therapists on breath hold techniques and breathing practice was performed. Practice waveforms were obtained. The patient was then scanned while maintaining breath hold in the treatment position.  This image was then transferred over to the imaging specialist. The imaging specialist then created a fusion of the free breathing and breath hold scans using the chest wall as the stable structure. I personally reviewed the fusion in axial, coronal and sagittal image planes.  Excellent cardiac sparing was obtained.  I felt the patient is an appropriate candidate for breath hold and the patient will be treated as such.  The image fusion was then reviewed with the patient to reinforce the necessity of reproducible breath hold.     _______________________________   Jodelle Gross, MD, PhD

## 2018-04-25 NOTE — Progress Notes (Signed)
  Radiation Oncology         ([phone]) [phone] ________________________________  Name: Jessica Glenn MRN: [id number]  Date: 04/25/2018  DOB: July 18, 1929  Optical Surface Tracking Plan:  Since intensity modulated radiotherapy (IMRT) and 3D conformal radiation treatment methods are predicated on accurate and precise positioning for treatment, intrafraction motion monitoring is medically necessary to ensure accurate and safe treatment delivery.  The ability to quantify intrafraction motion without excessive ionizing radiation dose can only be performed with optical surface tracking. Accordingly, surface imaging offers the opportunity to obtain 3D measurements of patient position throughout IMRT and 3D treatments without excessive radiation exposure.  I am ordering optical surface tracking for this patient's upcoming course of radiotherapy. ________________________________  Jessica Rudd, MD 04/25/2018 7:52 PM    Reference:   Jessica Glenn, J, et al. Surface imaging-based analysis of intrafraction motion for breast radiotherapy patients.Journal of [place], n. 6, nov. 2014. ISSN [id number].   Available at: <http://www.jacmp.org/index.php/jacmp/article/view/4957>.

## 2018-04-26 ENCOUNTER — Telehealth: Payer: Self-pay | Admitting: Adult Health

## 2018-04-26 ENCOUNTER — Inpatient Hospital Stay: Payer: Medicare Other | Attending: Oncology | Admitting: Adult Health

## 2018-04-26 ENCOUNTER — Telehealth: Payer: Self-pay | Admitting: Pharmacist

## 2018-04-26 ENCOUNTER — Encounter: Payer: Self-pay | Admitting: Adult Health

## 2018-04-26 VITALS — BP 151/73 | HR 68 | Temp 98.3°F | Resp 17 | Ht 65.0 in | Wt 155.0 lb

## 2018-04-26 DIAGNOSIS — C7951 Secondary malignant neoplasm of bone: Secondary | ICD-10-CM | POA: Diagnosis not present

## 2018-04-26 DIAGNOSIS — Z510 Encounter for antineoplastic radiation therapy: Secondary | ICD-10-CM | POA: Diagnosis not present

## 2018-04-26 DIAGNOSIS — C50112 Malignant neoplasm of central portion of left female breast: Secondary | ICD-10-CM

## 2018-04-26 DIAGNOSIS — C773 Secondary and unspecified malignant neoplasm of axilla and upper limb lymph nodes: Secondary | ICD-10-CM | POA: Diagnosis not present

## 2018-04-26 DIAGNOSIS — C50812 Malignant neoplasm of overlapping sites of left female breast: Secondary | ICD-10-CM

## 2018-04-26 DIAGNOSIS — Z170 Estrogen receptor positive status [ER+]: Secondary | ICD-10-CM

## 2018-04-26 MED FILL — CAPECITABINE 500 MG TABLET: 500 | 46 days supply | Qty: 132 | Fill #0

## 2018-04-26 NOTE — Telephone Encounter (Signed)
Oral Chemotherapy Pharmacist Encounter   I spoke with patient and husband in exam room to review Xeloda.   Counseledon administration, dosing, side effects, monitoring, drug-food interactions, safe handling, storage, and disposal.  Patient will take Xeloda 500mg  tablets, 2 tablets (1000mg ) by mouth in AM and 2 tabs (1000mg ) by mouth in PM, within 30 minutes of finishing meals, on days of radiation only.  Xeloda and radiation start date: planned for 04/30/18  Adverse effects of Xeloda include but are not limited to: fatigue, decreased blood counts, GI upset, diarrhea, and hand-foot syndrome.  Patient has anti-emetic on hand and knows to take it if nausea develops.   Patient will obtain anti diarrheal and alert the office of 4 or more loose stools above baseline.   Reviewed with patient importance of keeping a medication schedule and plan for any missed doses.  Ms. Porcaro voiced understanding and appreciation.   All questions answered.  Patient will pick up her Xeloda today after office visit. Copayment due is $0 (covered by [employer]) She will pick up entire Xeloda treatment course (#134) for planned treatments 04/30/18-06/14/18.  Medication reconciliation performed and medication/allergy list updated.  Patient knows to call the office with questions or concerns. Oral Oncology Clinic will continue to follow.  Thank you,  Johny Drilling, PharmD, BCPS, BCOP  04/26/2018  11:58 AM  Oral Oncology Clinic [phone]

## 2018-04-26 NOTE — Progress Notes (Addendum)
[place]  Telephone:(336) [phone] Fax:(336) [phone]     ID: Jessica Glenn DOB: 04-01-1929  MR#: [id number]  TMA#:[id number]  Patient Care Team: Maury Dus, MD as PCP - General (Family Medicine) Magrinat, Virgie Dad, MD as Consulting Physician (Oncology) Jovita Kussmaul, MD as Consulting Physician (General Surgery) OTHER MD:  CHIEF COMPLAINT: Locally advanced/metastatic estrogen receptor positive left breast cancer  CURRENT TREATMENT: [anastrozole]; radiation therapy   HISTORY OF CURRENT ILLNESS: From the original intake note:  Jessica Glenn tells me she first noted a changes in her left breast about a year and a half ago. She had had some dental work around that time and thought possibly that could be related. She said her breast felt "like steel". She did not bring it to medical attention until her recent appointment with Dr. Alyson Ingles and he set her up for bilateral diagnostic mammography with tomography and left breast ultrasonography at the [facility] 06/19/2017. This found the breast density to be category C. In the upper outer retroareolar left breast there was an irregular mass estimated to be at least 6 cm by mammography. There were heterogeneous calcifications within the mass. There was skin thickening of the areola and an enlarged inferior left axillary lymph node was noted. On exam there was a large fixed hard mass in the upper outer quadrant of the left breast causing protrusion of the areola. It measured approximately 7 cm by palpation and there was visible skin thickening. In the inferior left axilla there was a firm palpable mass measuring 2 cm.  Ultrasound confirmed an irregular hypoechoic vascular mass in the left breast centered on the 1:00 position 4 cm from the nipple measuring 6.7 cm. This was abutting the overlying skin. It was a 2.3 cm hypoechoic vascular mass in the left axilla. The right breast was unremarkable.  On 06/22/2017 the patient had left breast and  left axillary node biopsy, showing ([id number]) both sites to be involved by invasive ductal carcinoma, grade 2, with prognostic panels on both biopsies showing estrogen receptor 100% positivity, progesterone receptor 40-50% positivity, all with strong staining intensity, MIB-1:30 percent in the breast and 15% in the lymph nodes, and both HER-2 negative, with ratios of 0.76-0.85 and number per cell 1.10-1.30.  The patient's subsequent history is as detailed below.  INTERVAL HISTORY: Jessica Glenn is here today for follow up of her newly metastatic breast cancer accompanied by her husband.  She is doing well today.  She is due to start radiation next week.  She is anxious about her upcoming therapy.  She is unsure about taking the Capecitabine as a radiosensitizer and wants to talk more about it.    REVIEW OF SYSTEMS: Jessica Glenn is doing well today.  She occasionally has back pain.  Otherwise, she denies any new issues such as fever, chills, headaches, chest pain, palpitations, shortness of breath, cough, nausea, vomiting, constipation, diarrhea, or any other issues.  A detailed ROS was non contributory today.      PAST MEDICAL HISTORY: Past Medical History:  Diagnosis Date  . Basal cell carcinoma    s/p Moh's surgery  to left side of nose  . Breast cancer, left breast ([place]) dx'd 05/2017  . History of tobacco use   . Varicose vein of leg    BLE    PAST SURGICAL HISTORY: Past Surgical History:  Procedure Laterality Date  . BREAST BIOPSY Left 05/2017  . CATARACT EXTRACTION W/ INTRAOCULAR LENS  IMPLANT, BILATERAL Bilateral   . DILATION AND  CURETTAGE OF UTERUS    . FRACTURE SURGERY    . HARDWARE REMOVAL Right 02/14/2017   Procedure: RIGHT ANKLE HARDWARE REMOVAL;  Surgeon: [name], MD;  Location: [facility];  Service: Orthopedics;  Laterality: Right;  . I&D EXTREMITY Right 02/14/2017   Procedure: RIGHT IRRIGATION AND DEBRIDEMENT ANKLE;  Surgeon: [name], MD;  Location: [place];  Service: Orthopedics;  Laterality: Right;  . MASTECTOMY Left 02/14/2018   LEFT MASTECTOMY WITH DEEP LEFT AXILLARY SENTINEL LYMPH NODE BIOPSY   . MASTECTOMY W/ SENTINEL NODE BIOPSY Left 02/14/2018   Procedure: LEFT MASTECTOMY WITH SENTINEL LYMPH NODE BIOPSY AND POSSIBLE NODE DISSECTION;  Surgeon: Jovita Kussmaul, MD;  Location: [place] [facility];  Service: General;  Laterality: Left;  . MOHS SURGERY Left    "side of nose"  . ORIF ANKLE FRACTURE Right 02/02/2016   Procedure: OPEN REDUCTION INTERNAL FIXATION (ORIF) ANKLE FRACTURE;  Surgeon: [name], MD;  Location: [place];  Service: Orthopedics;  Laterality: Right;  strykermini c armreg bedbone foam    FAMILY HISTORY Family History  Problem Relation Age of Onset  . COPD Mother   . Emphysema Mother   The patient's father had a history of alcoholism and a band in the family. The patient has no information on the paternal side. The patient's mother died from emphysema at the age of 4. The patient has one sister, 2 brothers. There is no history of breast or ovarian cancer in the family to her knowledge.  GYNECOLOGIC HISTORY:  No LMP recorded. Patient is postmenopausal. Menarche age 89, first live birth age 27, the patient is Jessica Glenn. She went through the change of life in her 38s. She took hormone replacement for a few years.   SOCIAL HISTORY:  The patient is originally from [place]. Her husband Jessica Glenn is a traveling Theme park manager, Research officer, political party. Son peaked lives in [place] where he works as a Theme park manager; son Ronalee Belts lives in [place] where he works as an Optometrist; son Annie Main lives in [place] where he works as a Chief Strategy Officer; son Rodman Key also lives in [place] and works as a Chief Strategy Officer. The patient has 14 grandchildren and 82 great-grandchildren    ADVANCED DIRECTIVES: In place   HEALTH MAINTENANCE: Social History   Tobacco Use  . Smoking status: Former Smoker    Packs/day: 1.00    Years: 8.00    Pack  years: 8.00    Types: Cigarettes    Last attempt to quit: 1960    Years since quitting: 59.5  . Smokeless tobacco: Never Used  Substance Use Topics  . Alcohol use: Yes    Alcohol/week: 3.0 oz    Types: 5 Glasses of wine per week  . Drug use: No     Colonoscopy:Never  PAP:  Bone density:Osteopenia   No Known Allergies  Current Outpatient Medications  Medication Sig Dispense Refill  . anastrozole (ARIMIDEX) 1 MG tablet Take 1 tablet (1 mg total) by mouth daily. (Patient taking differently: Take 1 mg by mouth every evening. ) 90 tablet 4  . diphenhydramine-acetaminophen (TYLENOL PM) 25-500 MG TABS tablet Take 1-2 tablets by mouth at bedtime.     Marland Kitchen ascorbic acid (VITAMIN C) 1000 MG tablet Take 1,000 mg by mouth daily.    . Calcium Carb-Cholecalciferol (CALCIUM 600 + D PO) Take 1 tablet by mouth daily.    . Calcium-Magnesium-Zinc (CAL-MAG-ZINC PO) Take 1 tablet by mouth daily.    . capecitabine (XELODA) 500 MG tablet Take  2 tablets (1046m) by mouth 2 times daily, within 30 min of finishing food. Take on days of radiation only, M-F (Patient not taking: Reported on 04/26/2018) 132 tablet 0  . Cholecalciferol (VITAMIN D3) 3000 units TABS Take 3,000 Units by mouth daily.    . Cyanocobalamin (B-12) 5000 MCG CAPS Take 5,000 mcg by mouth daily.    .Marland Kitchendoxylamine, Sleep, (UNISOM) 25 MG tablet Take 25 mg by mouth at bedtime as needed for sleep.    . Multiple Vitamin (MULTIVITAMIN WITH MINERALS) TABS tablet Take 1 tablet by mouth daily.    . Probiotic CAPS Take 1 capsule by mouth daily.    . Red Yeast Rice Extract (RED YEAST RICE PO) Take 1,200 mg by mouth 3 (three) times a week.    .Marland KitchenVITAMIN E PO Take 180 Units by mouth daily.     No current facility-administered medications for this visit.     OBJECTIVE:  Vitals:   04/26/18 1028  BP: (!) 151/73  Pulse: 68  Resp: 17  Temp: 98.3 F (36.8 C)  SpO2: 98%     Body mass index is 25.79 kg/m.   Wt Readings from Last 3 Encounters:  04/26/18  155 lb (70.3 kg)  04/18/18 152 lb (68.9 kg)  03/30/18 153 lb 8 oz (69.6 kg)  ECOG FS:1 - Symptomatic but completely ambulatory GENERAL: Patient is a well appearing female in no acute distress HEENT:  Sclerae anicteric.  Oropharynx clear and moist. No ulcerations or evidence of oropharyngeal candidiasis. Neck is supple.  NODES:  No cervical, supraclavicular, or axillary lymphadenopathy palpated.  BREAST EXAM:  deferred LUNGS:  Clear to auscultation bilaterally.  No wheezes or rhonchi. HEART:  Regular rate and rhythm. No murmur appreciated. ABDOMEN:  Soft, nontender.  Positive, normoactive bowel sounds. No organomegaly palpated. MSK:  No focal spinal tenderness to palpation. Full range of motion bilaterally in the upper extremities. EXTREMITIES:  No peripheral edema.   SKIN:  Clear with no obvious rashes or skin changes. No nail dyscrasia. NEURO:  Nonfocal. Well oriented.  Appropriate affect.     LAB RESULTS:  CMP     Component Value Date/Time   NA 142 03/30/2018 0913   NA 140 10/27/2017 0908   K 4.8 03/30/2018 0913   K 4.8 10/27/2017 0908   CL 105 03/30/2018 0913   CO2 27 03/30/2018 0913   CO2 27 10/27/2017 0908   GLUCOSE 79 03/30/2018 0913   GLUCOSE 96 10/27/2017 0908   BUN 17 03/30/2018 0913   BUN 20.5 10/27/2017 0908   CREATININE 0.92 03/30/2018 0913   CREATININE 0.9 10/27/2017 0908   CALCIUM 9.6 03/30/2018 0913   CALCIUM 9.7 10/27/2017 0908   PROT 6.9 03/30/2018 0913   PROT 7.2 10/27/2017 0908   ALBUMIN 4.0 03/30/2018 0913   ALBUMIN 4.2 10/27/2017 0908   AST 18 03/30/2018 0913   AST 19 10/27/2017 0908   ALT 9 03/30/2018 0913   ALT 8 10/27/2017 0908   ALKPHOS 62 03/30/2018 0913   ALKPHOS 58 10/27/2017 0908   BILITOT 0.4 03/30/2018 0913   BILITOT 0.62 10/27/2017 0908   GFRNONAA 54 (L) 03/30/2018 0913   GFRAA >60 03/30/2018 0913    No results found for: TOTALPROTELP, ALBUMINELP, A1GS, A2GS, BETS, BETA2SER, GAMS, MSPIKE, SPEI  No results found for:  KPAFRELGTCHN, LAMBDASER, KAPLAMBRATIO  Lab Results  Component Value Date   WBC 4.3 03/30/2018   NEUTROABS 2.2 03/30/2018   HGB 13.2 03/30/2018   HCT 39.7 03/30/2018   MCV 90.8  03/30/2018   PLT 192 03/30/2018     Lab Results  Component Value Date   CA2729 69.5 (H) 03/30/2018       Urinalysis No results found for: COLORURINE, APPEARANCEUR, LABSPEC, PHURINE, GLUCOSEU, HGBUR, BILIRUBINUR, KETONESUR, PROTEINUR, UROBILINOGEN, NITRITE, LEUKOCYTESUR   STUDIES: Nm Pet Image Initial (pi) Skull Base To Thigh  Result Date: 03/30/2018 CLINICAL DATA:  Initial treatment strategy for metastatic left breast cancer. EXAM: NUCLEAR MEDICINE PET SKULL BASE TO THIGH TECHNIQUE: 7.7 mCi F-18 FDG was injected intravenously. Full-ring PET imaging was performed from the skull base to thigh after the radiotracer. CT data was obtained and used for attenuation correction and anatomic localization. Fasting blood glucose: 90 mg/dl COMPARISON:  Chest CT 02/05/2018. MRI of the thoracic spine of 03/14/2018. FINDINGS: Mediastinal blood pool activity: SUV max 2.2 NECK: No areas of abnormal hypermetabolism. Incidental CT findings: No cervical adenopathy. Bilateral carotid atherosclerosis. Right greater than left maxillary sinus mucosal thickening. CHEST: Interval left mastectomy with axillary node dissection. Postoperative hypermetabolism of the left chest wall. Left axillary low-density collection of 2.1 cm is non hypermetabolic, including on image 62/4. There is a low left axillary/lateral chest wall 1.9 cm low-density collection which is also and not hypermetabolic. Incidental CT findings: Aortic and branch vessel atherosclerosis. Mild cardiomegaly, accentuated by a pectus excavatum deformity.Relatively similar scattered bilateral pulmonary nodules. A left apical ground-glass nodule is similar at 8 mm on image 10/8. ABDOMEN/PELVIS: No abdominopelvic parenchymal or nodal hypermetabolism. Incidental CT findings: Right  hepatic lobe cyst. Normal adrenal glands. Abdominal aortic atherosclerosis. Pelvic floor laxity. Left adnexal/ovarian 3.9 cm low-density lesion on image 165/4. SKELETON: Hypermetabolism within the posterior aspect of the T2 vertebral body measures a S.U.V. max of 3.9. Incidental CT findings: none IMPRESSION: 1. Interval left mastectomy and axillary node dissection. Left axillary and chest wall low-density non FDG avid structures are likely postoperative seromas. 2. Isolated T2 osseous metastasis, as on prior CT and MRI. 3. Nonspecific bilateral pulmonary nodules, including a left apical ground-glass nodule. Below PET resolution. 4. Non FDG avid low-density left adnexal/ovarian lesion is most likely benign cyst. Consider further evaluation with pelvic ultrasound. Electronically Signed   By: Abigail Miyamoto M.D.   On: 03/30/2018 10:14    ELIGIBLE FOR AVAILABLE RESEARCH PROTOCOL: no  ASSESSMENT: 84 y.o. Jessica Glenn woman status post left breast overlapping sites biopsy 06/22/2017 for a clinical T3 N1-2, stage II-III invasive ductal carcinoma, grade 2, estrogen and progesterone receptor positive, HER-2 nonamplified, with an MIB-1 of 30%  (a) staging CT of the chest and bone survey and bone scan July 11, 2017 showed multiple indeterminant subcentimeter pulmonary nodules which will require follow-up, as well as an indeterminate right hepatic lobe lesion, but no definite evidence of malignancy.  (b) CA-27-29 is informative  (1) neo-adjuvant fulvestrant started on 07/05/2017, discontinued 12/20/2017 with possible progression  (2) status post left mastectomy and sentinel lymph node sampling 02/14/2018 for a T3 N1, stage IIA invasive ductal carcinoma, grade 1, with negative margins.  (a) 2 of 7 removed lymph nodes were positive  (3) adjuvant radiation to follow surgery  (4) anastrozole started 01/17/2018  (a) start palbociclib once disease progression is documented  (5) METASTATIC DISEASE:   (a) CT  scan of the chest 02/05/2018 shows a new lesion at T2  (b) total spinal MRI 03/14/2018 confirms lesion at T2 and possible minimal lesion at T8, neither amenable to biopsy; there are no other suspicious lesions  (6)  to start adjuvant radiation 05/01/2018  (a) will receive capecitabine  sensitization given the extent of local disease  (b) radiation to T8 planned    PLAN:  Marda is doing well today.  She will start radiation therapy next week.  We reviewed that she will start Capecitabine, 2 tablets, twice a day on her radiation day.  The purpose of this is to increase the cancer cells sensitivity to radiation.  We reviewed possible adverse effects.  She also met with Dr. Jana Hakim to review the plan.  After meeting with myself and Dr. Jana Hakim, she met with Johny Drilling PharmD to review the capecitabine in detail, including possible adverse effects of the medication.    After discussion with all of [place], Dalina has verbalized understanding and is ready to start taking Capecitabine on radiation day.  She will f/u with Dr. Jana Hakim on 05/07/2018 for labs and f/u.  She knows to call for any other issues that may develop before her next visit.   Wilber Bihari, NP  04/26/18 2:42 PM Medical Oncology and Hematology [facility] [address] [place], [place] [address] Tel. [phone]    Fax. [phone]   ADDENDUM: Saralynn is understandably anxious regarding starting radiation.  We also discussed capecitabine sensitization today.  She understands she will not be receiving "treatment doses" of this drug and that therefore many of the side effects listed are very unlikely to occur.  Patient's receiving the dose that she will be receiving seldom have problems with counts fatigue hair loss or nausea.  There is always the idiosyncratic case were diarrhea may develop and she was repeatedly instructed to call us if that develops if she does not become dehydrated.  When she completes her radiation  and recovers from it she will see me and we will consider low-dose palbociclib.  She will stop the anastrozole until she is done with radiation  I personally saw this patient and performed a substantive portion of this encounter with the listed APP documented above.   Chauncey Cruel, MD Medical Oncology and Hematology [facility] [address] [place], [place] [address] Tel. [phone]    Fax. [phone]

## 2018-04-26 NOTE — Telephone Encounter (Signed)
No 6/27 los, orders or referrals.  °

## 2018-04-30 ENCOUNTER — Ambulatory Visit
Admission: RE | Admit: 2018-04-30 | Discharge: 2018-04-30 | Disposition: A | Discharge status: Home / Self Care | Payer: Medicare Other | Source: Ambulatory Visit | Attending: Radiation Oncology | Admitting: Radiation Oncology

## 2018-04-30 DIAGNOSIS — Z510 Encounter for antineoplastic radiation therapy: Secondary | ICD-10-CM | POA: Insufficient documentation

## 2018-04-30 DIAGNOSIS — C50812 Malignant neoplasm of overlapping sites of left female breast: Secondary | ICD-10-CM | POA: Diagnosis not present

## 2018-04-30 DIAGNOSIS — Z170 Estrogen receptor positive status [ER+]: Secondary | ICD-10-CM | POA: Insufficient documentation

## 2018-04-30 DIAGNOSIS — C7951 Secondary malignant neoplasm of bone: Secondary | ICD-10-CM | POA: Diagnosis not present

## 2018-05-01 ENCOUNTER — Ambulatory Visit
Admission: RE | Admit: 2018-05-01 | Discharge: 2018-05-01 | Disposition: A | Discharge status: Home / Self Care | Payer: Medicare Other | Source: Ambulatory Visit | Attending: Radiation Oncology | Admitting: Radiation Oncology

## 2018-05-01 ENCOUNTER — Ambulatory Visit: Payer: Medicare Other | Admitting: Physical Therapy

## 2018-05-01 DIAGNOSIS — C7951 Secondary malignant neoplasm of bone: Secondary | ICD-10-CM | POA: Diagnosis not present

## 2018-05-02 ENCOUNTER — Ambulatory Visit
Admission: RE | Admit: 2018-05-02 | Discharge: 2018-05-02 | Disposition: A | Discharge status: Home / Self Care | Payer: Medicare Other | Source: Ambulatory Visit | Attending: Radiation Oncology | Admitting: Radiation Oncology

## 2018-05-02 DIAGNOSIS — C7951 Secondary malignant neoplasm of bone: Secondary | ICD-10-CM | POA: Diagnosis not present

## 2018-05-04 ENCOUNTER — Ambulatory Visit
Admission: RE | Admit: 2018-05-04 | Discharge: 2018-05-04 | Disposition: A | Discharge status: Home / Self Care | Payer: Medicare Other | Source: Ambulatory Visit | Attending: Radiation Oncology | Admitting: Radiation Oncology

## 2018-05-04 DIAGNOSIS — C7951 Secondary malignant neoplasm of bone: Secondary | ICD-10-CM | POA: Diagnosis not present

## 2018-05-06 NOTE — Progress Notes (Signed)
Jessica Glenn  [phone] [phone]) [phone]     ID: Jessica Glenn DOB: [birth year]  MR#: [id number]  NOT#:[id number]  Patient Care Team: Maury Dus, MD as PCP - General (Family Medicine) Magrinat, Virgie Dad, MD as Consulting Physician (Oncology) Jovita Kussmaul, MD as Consulting Physician (General Surgery) OTHER MD:  CHIEF COMPLAINT: Locally advanced/metastatic estrogen receptor positive left breast cancer  CURRENT TREATMENT: [anastrozole]; radiation therapy with capecitabine sensitizaton   HISTORY OF CURRENT ILLNESS: From the original intake note:  Jessica Glenn tells me she first noted a changes in her left breast about a year and a half ago. She had had some dental work around that time and thought possibly that could be related. She said her breast felt "like steel". She did not bring it to medical attention until her recent appointment with Dr. Alyson Ingles and he set her up for bilateral diagnostic mammography with tomography and left breast ultrasonography at the [facility] 06/19/2017. This found the breast density to be category C. In the upper outer retroareolar left breast there was an irregular mass estimated to be at least 6 cm by mammography. There were heterogeneous calcifications within the mass. There was skin thickening of the areola and an enlarged inferior left axillary lymph node was noted. On exam there was a large fixed hard mass in the upper outer quadrant of the left breast causing protrusion of the areola. It measured approximately 7 cm by palpation and there was visible skin thickening. In the inferior left axilla there was a firm palpable mass measuring 2 cm.  Ultrasound confirmed an irregular hypoechoic vascular mass in the left breast centered on the 1:00 position 4 cm from the nipple measuring 6.7 cm. This was abutting the overlying skin. It was a 2.3 cm hypoechoic vascular mass in the left axilla. The right breast was unremarkable.  On 06/22/2017 the  patient had left breast and left axillary node biopsy, showing ([id number]) both sites to be involved by invasive ductal carcinoma, grade 2, with prognostic panels on both biopsies showing estrogen receptor 100% positivity, progesterone receptor 40-50% positivity, all with strong staining intensity, MIB-1:30 percent in the breast and 15% in the lymph nodes, and both HER-2 negative, with ratios of 0.76-0.85 and number per cell 1.10-1.30.  The patient's subsequent history is as detailed below.  INTERVAL HISTORY: Jessica Glenn returns today for follow-up and treatment of her locally advanced/metastatic estrogen receptor positive left breast cancer.   She started her radiation last week.  She has only had 3 days so far.  She has had no side effects whatsoever.  She is able to lie flat without difficulty and to get herself into position.  She does think that she needs to do further stretching to get full range of motion in her left upper extremity.  She is currently off the anastrozole of course, while receiving the capecitabine.  She started the capecitabine last week as well.  She is taking sensitization doses.  She has had no side effects from this so far.    REVIEW OF SYSTEMS: Jessica Glenn is doing well. She reports her blood pressure has gone up since coming to the breast center. At this time she is tolerating radiation well without any issues at this time. Yesterday, she experienced an episode of loose stool, that has since resolved.  She feels this is related to some food she ate over the holiday.  The patient denies unusual headaches, visual changes, nausea, vomiting, or dizziness. There has been no unusual  cough, phlegm production, or pleurisy. This been no change in bladder habits. The patient denies unexplained fatigue or unexplained weight loss, bleeding, rash, or fever. A detailed review of systems was otherwise noncontributory.      PAST MEDICAL HISTORY: Past Medical History:  Diagnosis Date  . Basal  cell carcinoma    s/p Moh's surgery  to left side of nose  . Breast cancer, left breast ([place]) dx'd 05/2017  . History of tobacco use   . Varicose vein of leg    BLE    PAST SURGICAL HISTORY: Past Surgical History:  Procedure Laterality Date  . BREAST BIOPSY Left 05/2017  . CATARACT EXTRACTION W/ INTRAOCULAR LENS  IMPLANT, BILATERAL Bilateral   . DILATION AND CURETTAGE OF UTERUS    . FRACTURE SURGERY    . HARDWARE REMOVAL Right 02/14/2017   Procedure: RIGHT ANKLE HARDWARE REMOVAL;  Surgeon: [name], MD;  Location: [facility];  Service: Orthopedics;  Laterality: Right;  . I&D EXTREMITY Right 02/14/2017   Procedure: RIGHT IRRIGATION AND DEBRIDEMENT ANKLE;  Surgeon: [name], MD;  Location: [facility];  Service: Orthopedics;  Laterality: Right;  . MASTECTOMY Left 02/14/2018   LEFT MASTECTOMY WITH DEEP LEFT AXILLARY SENTINEL LYMPH NODE BIOPSY   . MASTECTOMY W/ SENTINEL NODE BIOPSY Left 02/14/2018   Procedure: LEFT MASTECTOMY WITH SENTINEL LYMPH NODE BIOPSY AND POSSIBLE NODE DISSECTION;  Surgeon: Jovita Kussmaul, MD;  Location: [facility];  Service: General;  Laterality: Left;  . MOHS SURGERY Left    "side of nose"  . ORIF ANKLE FRACTURE Right 02/02/2016   Procedure: OPEN REDUCTION INTERNAL FIXATION (ORIF) ANKLE FRACTURE;  Surgeon: [name], MD;  Location: [facility];  Service: Orthopedics;  Laterality: Right;  strykermini c armreg bedbone foam    FAMILY HISTORY Family History  Problem Relation Age of Onset  . COPD Mother   . Emphysema Mother   The patient's father had a history of alcoholism and a band in the family. The patient has no information on the paternal side. The patient's mother died from emphysema at the age of 54. The patient has one sister, 2 brothers. There is no history of breast or ovarian cancer in the family to her knowledge.  GYNECOLOGIC HISTORY:  No LMP recorded. Patient is postmenopausal. Menarche age 72,  first live birth age 50, the patient is [place] P4. She went through the change of life in her 36s. She took hormone replacement for a few years.   SOCIAL HISTORY:  The patient is originally from [place]. Her husband Jessica Glenn is a traveling Theme park manager, Research officer, political party. Son peaked lives in [place] where he works as a Theme park manager; son Jessica Glenn lives in [place] where he works as an Optometrist; son Jessica Glenn lives in [place] where he works as a Chief Strategy Officer; son Jessica Glenn also lives in [place] and works as a Chief Strategy Officer. The patient has 14 grandchildren and 38 great-grandchildren    ADVANCED DIRECTIVES: In place   HEALTH MAINTENANCE: Social History   Tobacco Use  . Smoking status: Former Smoker    Packs/day: 1.00    Years: 8.00    Pack years: 8.00    Types: Cigarettes    Last attempt to quit: 1960    Years since quitting: 59.5  . Smokeless tobacco: Never Used  Substance Use Topics  . Alcohol use: Yes    Alcohol/week: 3.0 oz    Types: 5 Glasses of wine per week  . Drug use: No  Colonoscopy:Never  PAP:  Bone density:Osteopenia   No Known Allergies  Current Outpatient Medications  Medication Sig Dispense Refill  . anastrozole (ARIMIDEX) 1 MG tablet Take 1 tablet (1 mg total) by mouth daily. (Patient taking differently: Take 1 mg by mouth every evening. ) 90 tablet 4  . ascorbic acid (VITAMIN C) 1000 MG tablet Take 1,000 mg by mouth daily.    . Calcium Carb-Cholecalciferol (CALCIUM 600 + D PO) Take 1 tablet by mouth daily.    . Calcium-Magnesium-Zinc (CAL-MAG-ZINC PO) Take 1 tablet by mouth daily.    . capecitabine (XELODA) 500 MG tablet Take 2 tablets (1040m) by mouth 2 times daily, within 30 min of finishing food. Take on days of radiation only, M-F (Patient not taking: Reported on 04/26/2018) 132 tablet 0  . Cholecalciferol (VITAMIN D3) 3000 units TABS Take 3,000 Units by mouth daily.    . Cyanocobalamin (B-12) 5000 MCG CAPS Take 5,000 mcg by mouth daily.    .  diphenhydramine-acetaminophen (TYLENOL PM) 25-500 MG TABS tablet Take 1-2 tablets by mouth at bedtime.     .Marland Kitchendoxylamine, Sleep, (UNISOM) 25 MG tablet Take 25 mg by mouth at bedtime as needed for sleep.    . Multiple Vitamin (MULTIVITAMIN WITH MINERALS) TABS tablet Take 1 tablet by mouth daily.    . Probiotic CAPS Take 1 capsule by mouth daily.    . Red Yeast Rice Extract (RED YEAST RICE PO) Take 1,200 mg by mouth 3 (three) times a week.    .Marland KitchenVITAMIN E PO Take 180 Units by mouth daily.     No current facility-administered medications for this visit.     OBJECTIVE: Older white woman in no acute distress  Vitals:   05/07/18 1515  BP: (!) 161/78  Pulse: 66  Resp: 18  Temp: 98 F (36.7 C)  SpO2: 97%     Body mass index is 25.84 kg/m.   Wt Readings from Last 3 Encounters:  05/07/18 155 lb 4.8 oz (70.4 kg)  04/26/18 155 lb (70.3 kg)  04/18/18 152 lb (68.9 kg)  ECOG FS:1 - Symptomatic but completely ambulatory   Sclerae unicteric, EOMs intact Oropharynx clear and moist No cervical or supraclavicular adenopathy Lungs no rales or rhonchi Heart regular rate and rhythm Abd soft, nontender, positive bowel sounds MSK no focal spinal tenderness, no upper extremity lymphedema Neuro: nonfocal, well oriented, appropriate affect Breasts: The right breast is unremarkable.  The left breast is status post mastectomy.  There is minimal erythema associated with the scar.  There is no dehiscence.  There is no evidence of recurrence.  Both axillae are benign.   LAB RESULTS:  CMP     Component Value Date/Time   NA 142 03/30/2018 0913   NA 140 10/27/2017 0908   K 4.8 03/30/2018 0913   K 4.8 10/27/2017 0908   CL 105 03/30/2018 0913   CO2 27 03/30/2018 0913   CO2 27 10/27/2017 0908   GLUCOSE 79 03/30/2018 0913   GLUCOSE 96 10/27/2017 0908   BUN 17 03/30/2018 0913   BUN 20.5 10/27/2017 0908   CREATININE 0.92 03/30/2018 0913   CREATININE 0.9 10/27/2017 0908   CALCIUM 9.6 03/30/2018 0913    CALCIUM 9.7 10/27/2017 0908   PROT 6.9 03/30/2018 0913   PROT 7.2 10/27/2017 0908   ALBUMIN 4.0 03/30/2018 0913   ALBUMIN 4.2 10/27/2017 0908   AST 18 03/30/2018 0913   AST 19 10/27/2017 0908   ALT 9 03/30/2018 0913   ALT 8 10/27/2017  0908   ALKPHOS 62 03/30/2018 0913   ALKPHOS 58 10/27/2017 0908   BILITOT 0.4 03/30/2018 0913   BILITOT 0.62 10/27/2017 0908   GFRNONAA 54 (L) 03/30/2018 0913   GFRAA >60 03/30/2018 0913    No results found for: TOTALPROTELP, ALBUMINELP, A1GS, A2GS, BETS, BETA2SER, GAMS, MSPIKE, SPEI  No results found for: KPAFRELGTCHN, LAMBDASER, KAPLAMBRATIO  Lab Results  Component Value Date   WBC 5.2 05/07/2018   NEUTROABS 2.8 05/07/2018   HGB 13.7 05/07/2018   HCT 41.5 05/07/2018   MCV 91.8 05/07/2018   PLT 137 (L) 05/07/2018     Lab Results  Component Value Date   CA2729 69.5 (H) 03/30/2018       Urinalysis No results found for: COLORURINE, APPEARANCEUR, LABSPEC, PHURINE, GLUCOSEU, HGBUR, BILIRUBINUR, KETONESUR, PROTEINUR, UROBILINOGEN, NITRITE, LEUKOCYTESUR   STUDIES: No results found.  ELIGIBLE FOR AVAILABLE RESEARCH PROTOCOL: no  ASSESSMENT: 84 y.o. [place] woman status post left breast overlapping sites biopsy 06/22/2017 for a clinical T3 N1-2, stage II-III invasive ductal carcinoma, grade 2, estrogen and progesterone receptor positive, HER-2 nonamplified, with an MIB-1 of 30%  (a) staging CT of the chest and bone survey and bone scan July 11, 2017 showed multiple indeterminant subcentimeter pulmonary nodules which will require follow-up, as well as an indeterminate right hepatic lobe lesion, but no definite evidence of malignancy.  (b) CA-27-29 is informative  (1) neo-adjuvant fulvestrant started on 07/05/2017, discontinued 12/20/2017 with possible progression  (2) status post left mastectomy and sentinel lymph node sampling 02/14/2018 for a T3 N1, stage IIA invasive ductal carcinoma, grade 1, with negative margins.  (a) 2  of 7 removed lymph nodes were positive  (3) adjuvant radiation to follow surgery  (4) anastrozole started 01/17/2018  (a) start palbociclib once disease progression is documented  (5) METASTATIC DISEASE:   (a) CT scan of the chest 02/05/2018 shows a new lesion at T2  (b) total spinal MRI 03/14/2018 confirms lesion at T2 and possible minimal lesion at T8, neither amenable to biopsy; there are no other suspicious lesions  (6)  to start adjuvant radiation 05/01/2018, to be completed 06/15/2018  (a) will receive capecitabine sensitization given the extent of local disease  (b) radiation to T8 planned    PLAN: Chandler is tolerating radiation well.  Of course he only did just start.  More importantly she is having no problems from the capecitabine sensitization pills and she has a good understanding of how to take them.  Specifically she understands not to take them on days when she is not receiving radiation.  I have encouraged her to continue her activity level as normal as possible.  She is concerned about the blood pressure and I have asked her to get that checked downstairs, with different equipment, to see if there is a difference.  She will need further rehab of the left upper extremity once she recovers from radiation but for now I see no reason why she could not be doing some stretching on her own  If she does develop diarrhea or any other side effects from the capecitabine I have asked her to stop in my office without an appointment on radiation days.  Otherwise she will finish radiation on 06/25/2018 and she will see me the first week in September.  At that time we will discuss palbociclib  She knows to call for any other issues that may develop before then.  Magrinat, Virgie Dad, MD  05/07/18 3:40 PM Medical Oncology and Hematology [facility]  [place], [place] [address] Tel. [phone]    Fax. [phone]   I, Margit Banda am acting as a scribe  for Chauncey Cruel, MD.   I, Lurline Del MD, have reviewed the above documentation for accuracy and completeness, and I agree with the above.

## 2018-05-07 ENCOUNTER — Ambulatory Visit
Admission: RE | Admit: 2018-05-07 | Discharge: 2018-05-07 | Disposition: A | Discharge status: Home / Self Care | Payer: Medicare Other | Source: Ambulatory Visit | Attending: Radiation Oncology | Admitting: Radiation Oncology

## 2018-05-07 ENCOUNTER — Inpatient Hospital Stay: Payer: Medicare Other | Attending: Oncology | Admitting: Oncology

## 2018-05-07 ENCOUNTER — Inpatient Hospital Stay: Payer: Medicare Other

## 2018-05-07 ENCOUNTER — Telehealth: Payer: Self-pay | Admitting: Oncology

## 2018-05-07 VITALS — BP 161/78 | HR 66 | Temp 98.0°F | Resp 18 | Ht 65.0 in | Wt 155.3 lb

## 2018-05-07 DIAGNOSIS — Z170 Estrogen receptor positive status [ER+]: Secondary | ICD-10-CM | POA: Diagnosis not present

## 2018-05-07 DIAGNOSIS — C50812 Malignant neoplasm of overlapping sites of left female breast: Secondary | ICD-10-CM

## 2018-05-07 DIAGNOSIS — C50412 Malignant neoplasm of upper-outer quadrant of left female breast: Secondary | ICD-10-CM | POA: Insufficient documentation

## 2018-05-07 DIAGNOSIS — C7951 Secondary malignant neoplasm of bone: Secondary | ICD-10-CM | POA: Diagnosis not present

## 2018-05-07 DIAGNOSIS — C50912 Malignant neoplasm of unspecified site of left female breast: Secondary | ICD-10-CM

## 2018-05-07 LAB — COMPREHENSIVE METABOLIC PANEL
ALT: 10 U/L (ref 0–44)
AST: 18 U/L (ref 15–41)
Albumin: 4.6 g/dL (ref 3.5–5.0)
Alkaline Phosphatase: 66 U/L (ref 38–126)
Anion gap: 7 (ref 5–15)
BUN: 19 mg/dL (ref 8–23)
CHLORIDE: 105 mmol/L (ref 98–111)
CO2: 29 mmol/L (ref 22–32)
CREATININE: 0.91 mg/dL (ref 0.44–1.00)
Calcium: 10 mg/dL (ref 8.9–10.3)
GFR calc Af Amer: >60 mL/min (ref >60)
GFR calc non Af Amer: 55 mL/min — ABNORMAL LOW (ref >60)
Glucose, Bld: 91 mg/dL (ref 70–99)
POTASSIUM: 4.7 mmol/L (ref 3.5–5.1)
Sodium: 141 mmol/L (ref 135–145)
Total Bilirubin: 0.6 mg/dL (ref 0.3–1.2)
Total Protein: 7.4 g/dL (ref 6.5–8.1)

## 2018-05-07 LAB — CBC WITH DIFFERENTIAL/PLATELET
Basophils Absolute: 0 10*3/uL (ref 0.0–0.1)
Basophils Relative: 0 %
EOS ABS: 0.2 10*3/uL (ref 0.0–0.5)
Eosinophils Relative: 3 %
HCT: 41.5 % (ref 34.8–46.6)
Hemoglobin: 13.7 g/dL (ref 11.6–15.9)
LYMPHS ABS: 1.8 10*3/uL (ref 0.9–3.3)
Lymphocytes Relative: 34 %
MCH: 30.3 pg (ref 25.1–34.0)
MCHC: 33 g/dL (ref 31.5–36.0)
MCV: 91.8 fL (ref 79.5–101.0)
Monocytes Absolute: 0.5 10*3/uL (ref 0.1–0.9)
Monocytes Relative: 9 %
Neutro Abs: 2.8 10*3/uL (ref 1.5–6.5)
Neutrophils Relative %: 54 %
Platelets: 137 10*3/uL — ABNORMAL LOW (ref 145–400)
RBC: 4.52 MIL/uL (ref 3.70–5.45)
RDW: 13 % (ref 11.2–14.5)
WBC: 5.2 10*3/uL (ref 3.9–10.3)

## 2018-05-07 NOTE — Telephone Encounter (Signed)
Gave pt avs and calendar with appts per 7/8 los.

## 2018-05-08 ENCOUNTER — Ambulatory Visit
Admission: RE | Admit: 2018-05-08 | Discharge: 2018-05-08 | Disposition: A | Discharge status: Home / Self Care | Payer: Medicare Other | Source: Ambulatory Visit | Attending: Radiation Oncology | Admitting: Radiation Oncology

## 2018-05-08 DIAGNOSIS — C7951 Secondary malignant neoplasm of bone: Secondary | ICD-10-CM | POA: Diagnosis not present

## 2018-05-08 LAB — CANCER ANTIGEN 27.29: CAN 27.29: 55.9 U/mL — AB (ref 0.0–38.6)

## 2018-05-09 ENCOUNTER — Ambulatory Visit
Admission: RE | Admit: 2018-05-09 | Discharge: 2018-05-09 | Disposition: A | Discharge status: Home / Self Care | Payer: Medicare Other | Source: Ambulatory Visit | Attending: Radiation Oncology | Admitting: Radiation Oncology

## 2018-05-09 DIAGNOSIS — C7951 Secondary malignant neoplasm of bone: Secondary | ICD-10-CM | POA: Diagnosis not present

## 2018-05-10 ENCOUNTER — Ambulatory Visit
Admission: RE | Admit: 2018-05-10 | Discharge: 2018-05-10 | Disposition: A | Discharge status: Home / Self Care | Payer: Medicare Other | Source: Ambulatory Visit | Attending: Radiation Oncology | Admitting: Radiation Oncology

## 2018-05-10 DIAGNOSIS — Z170 Estrogen receptor positive status [ER+]: Principal | ICD-10-CM

## 2018-05-10 DIAGNOSIS — C50812 Malignant neoplasm of overlapping sites of left female breast: Secondary | ICD-10-CM

## 2018-05-10 DIAGNOSIS — C7951 Secondary malignant neoplasm of bone: Secondary | ICD-10-CM | POA: Diagnosis not present

## 2018-05-10 MED ORDER — ALRA NON-METALLIC DEODORANT (RAD-ONC)
1.0000 "application " | Freq: Once | TOPICAL | Status: AC
  Administered 2018-05-10: 1 via TOPICAL

## 2018-05-10 MED ORDER — RADIAPLEXRX EX GEL
Freq: Once | CUTANEOUS | Status: AC
  Administered 2018-05-10: 15:00:00 via TOPICAL

## 2018-05-11 ENCOUNTER — Ambulatory Visit
Admission: RE | Admit: 2018-05-11 | Discharge: 2018-05-11 | Disposition: A | Discharge status: Home / Self Care | Payer: Medicare Other | Source: Ambulatory Visit | Attending: Radiation Oncology | Admitting: Radiation Oncology

## 2018-05-11 DIAGNOSIS — C7951 Secondary malignant neoplasm of bone: Secondary | ICD-10-CM | POA: Diagnosis not present

## 2018-05-14 ENCOUNTER — Ambulatory Visit
Admission: RE | Admit: 2018-05-14 | Discharge: 2018-05-14 | Disposition: A | Discharge status: Home / Self Care | Payer: Medicare Other | Source: Ambulatory Visit | Attending: Radiation Oncology | Admitting: Radiation Oncology

## 2018-05-14 DIAGNOSIS — C7951 Secondary malignant neoplasm of bone: Secondary | ICD-10-CM | POA: Diagnosis not present

## 2018-05-15 ENCOUNTER — Ambulatory Visit
Admission: RE | Admit: 2018-05-15 | Discharge: 2018-05-15 | Disposition: A | Discharge status: Home / Self Care | Payer: Medicare Other | Source: Ambulatory Visit | Attending: Radiation Oncology | Admitting: Radiation Oncology

## 2018-05-15 DIAGNOSIS — C7951 Secondary malignant neoplasm of bone: Secondary | ICD-10-CM | POA: Diagnosis not present

## 2018-05-16 ENCOUNTER — Ambulatory Visit
Admission: RE | Admit: 2018-05-16 | Discharge: 2018-05-16 | Disposition: A | Discharge status: Home / Self Care | Payer: Medicare Other | Source: Ambulatory Visit | Attending: Radiation Oncology | Admitting: Radiation Oncology

## 2018-05-16 DIAGNOSIS — C7951 Secondary malignant neoplasm of bone: Secondary | ICD-10-CM | POA: Diagnosis not present

## 2018-05-17 ENCOUNTER — Ambulatory Visit
Admission: RE | Admit: 2018-05-17 | Discharge: 2018-05-17 | Disposition: A | Discharge status: Home / Self Care | Payer: Medicare Other | Source: Ambulatory Visit | Attending: Radiation Oncology | Admitting: Radiation Oncology

## 2018-05-17 DIAGNOSIS — C7951 Secondary malignant neoplasm of bone: Secondary | ICD-10-CM | POA: Diagnosis not present

## 2018-05-18 ENCOUNTER — Ambulatory Visit
Admission: RE | Admit: 2018-05-18 | Discharge: 2018-05-18 | Disposition: A | Discharge status: Home / Self Care | Payer: Medicare Other | Source: Ambulatory Visit | Attending: Radiation Oncology | Admitting: Radiation Oncology

## 2018-05-18 DIAGNOSIS — C7951 Secondary malignant neoplasm of bone: Secondary | ICD-10-CM | POA: Diagnosis not present

## 2018-05-21 ENCOUNTER — Ambulatory Visit
Admission: RE | Admit: 2018-05-21 | Discharge: 2018-05-21 | Disposition: A | Discharge status: Home / Self Care | Payer: Medicare Other | Source: Ambulatory Visit | Attending: Radiation Oncology | Admitting: Radiation Oncology

## 2018-05-21 DIAGNOSIS — C7951 Secondary malignant neoplasm of bone: Secondary | ICD-10-CM | POA: Diagnosis not present

## 2018-05-22 ENCOUNTER — Ambulatory Visit
Admission: RE | Admit: 2018-05-22 | Discharge: 2018-05-22 | Disposition: A | Discharge status: Home / Self Care | Payer: Medicare Other | Source: Ambulatory Visit | Attending: Radiation Oncology | Admitting: Radiation Oncology

## 2018-05-22 DIAGNOSIS — C7951 Secondary malignant neoplasm of bone: Secondary | ICD-10-CM | POA: Diagnosis not present

## 2018-05-23 ENCOUNTER — Other Ambulatory Visit: Payer: Self-pay | Admitting: Radiation Therapy

## 2018-05-23 ENCOUNTER — Ambulatory Visit
Admission: RE | Admit: 2018-05-23 | Discharge: 2018-05-23 | Disposition: A | Discharge status: Home / Self Care | Payer: Medicare Other | Source: Ambulatory Visit | Attending: Radiation Oncology | Admitting: Radiation Oncology

## 2018-05-23 DIAGNOSIS — C7951 Secondary malignant neoplasm of bone: Secondary | ICD-10-CM

## 2018-05-24 ENCOUNTER — Ambulatory Visit
Admission: RE | Admit: 2018-05-24 | Discharge: 2018-05-24 | Disposition: A | Discharge status: Home / Self Care | Payer: Medicare Other | Source: Ambulatory Visit | Attending: Radiation Oncology | Admitting: Radiation Oncology

## 2018-05-24 DIAGNOSIS — C7951 Secondary malignant neoplasm of bone: Secondary | ICD-10-CM | POA: Diagnosis not present

## 2018-05-25 ENCOUNTER — Ambulatory Visit
Admission: RE | Admit: 2018-05-25 | Discharge: 2018-05-25 | Disposition: A | Discharge status: Home / Self Care | Payer: Medicare Other | Source: Ambulatory Visit | Attending: Radiation Oncology | Admitting: Radiation Oncology

## 2018-05-25 DIAGNOSIS — C7951 Secondary malignant neoplasm of bone: Secondary | ICD-10-CM | POA: Diagnosis not present

## 2018-05-28 ENCOUNTER — Ambulatory Visit
Admission: RE | Admit: 2018-05-28 | Discharge: 2018-05-28 | Disposition: A | Discharge status: Home / Self Care | Payer: Medicare Other | Source: Ambulatory Visit | Attending: Radiation Oncology | Admitting: Radiation Oncology

## 2018-05-28 DIAGNOSIS — C7951 Secondary malignant neoplasm of bone: Secondary | ICD-10-CM | POA: Diagnosis not present

## 2018-05-29 ENCOUNTER — Ambulatory Visit
Admission: RE | Admit: 2018-05-29 | Discharge: 2018-05-29 | Disposition: A | Discharge status: Home / Self Care | Payer: Medicare Other | Source: Ambulatory Visit | Attending: Radiation Oncology | Admitting: Radiation Oncology

## 2018-05-29 DIAGNOSIS — C7951 Secondary malignant neoplasm of bone: Secondary | ICD-10-CM | POA: Diagnosis not present

## 2018-05-30 ENCOUNTER — Ambulatory Visit
Admission: RE | Admit: 2018-05-30 | Discharge: 2018-05-30 | Disposition: A | Discharge status: Home / Self Care | Payer: Medicare Other | Source: Ambulatory Visit | Attending: Radiation Oncology | Admitting: Radiation Oncology

## 2018-05-30 DIAGNOSIS — C7951 Secondary malignant neoplasm of bone: Secondary | ICD-10-CM | POA: Diagnosis not present

## 2018-05-31 ENCOUNTER — Ambulatory Visit
Admission: RE | Admit: 2018-05-31 | Discharge: 2018-05-31 | Disposition: A | Discharge status: Home / Self Care | Payer: Medicare Other | Source: Ambulatory Visit | Attending: Radiation Oncology | Admitting: Radiation Oncology

## 2018-05-31 DIAGNOSIS — Z510 Encounter for antineoplastic radiation therapy: Secondary | ICD-10-CM | POA: Diagnosis not present

## 2018-05-31 DIAGNOSIS — C50412 Malignant neoplasm of upper-outer quadrant of left female breast: Secondary | ICD-10-CM | POA: Insufficient documentation

## 2018-05-31 DIAGNOSIS — C7951 Secondary malignant neoplasm of bone: Secondary | ICD-10-CM | POA: Diagnosis not present

## 2018-05-31 DIAGNOSIS — Z170 Estrogen receptor positive status [ER+]: Secondary | ICD-10-CM | POA: Diagnosis not present

## 2018-06-01 ENCOUNTER — Ambulatory Visit
Admission: RE | Admit: 2018-06-01 | Discharge: 2018-06-01 | Disposition: A | Discharge status: Home / Self Care | Payer: Medicare Other | Source: Ambulatory Visit | Attending: Radiation Oncology | Admitting: Radiation Oncology

## 2018-06-01 ENCOUNTER — Ambulatory Visit: Payer: Medicare Other | Admitting: Radiation Oncology

## 2018-06-01 DIAGNOSIS — C7951 Secondary malignant neoplasm of bone: Secondary | ICD-10-CM | POA: Diagnosis not present

## 2018-06-04 ENCOUNTER — Ambulatory Visit
Admission: RE | Admit: 2018-06-04 | Discharge: 2018-06-04 | Disposition: A | Discharge status: Home / Self Care | Payer: Medicare Other | Source: Ambulatory Visit | Attending: Radiation Oncology | Admitting: Radiation Oncology

## 2018-06-04 DIAGNOSIS — C7951 Secondary malignant neoplasm of bone: Secondary | ICD-10-CM | POA: Diagnosis not present

## 2018-06-05 ENCOUNTER — Ambulatory Visit
Admission: RE | Admit: 2018-06-05 | Discharge: 2018-06-05 | Disposition: A | Discharge status: Home / Self Care | Payer: Medicare Other | Source: Ambulatory Visit | Attending: Radiation Oncology | Admitting: Radiation Oncology

## 2018-06-05 DIAGNOSIS — C7951 Secondary malignant neoplasm of bone: Secondary | ICD-10-CM | POA: Diagnosis not present

## 2018-06-06 ENCOUNTER — Ambulatory Visit
Admission: RE | Admit: 2018-06-06 | Discharge: 2018-06-06 | Disposition: A | Discharge status: Home / Self Care | Payer: Medicare Other | Source: Ambulatory Visit | Attending: Radiation Oncology | Admitting: Radiation Oncology

## 2018-06-06 DIAGNOSIS — C7951 Secondary malignant neoplasm of bone: Secondary | ICD-10-CM | POA: Diagnosis not present

## 2018-06-07 ENCOUNTER — Ambulatory Visit
Admission: RE | Admit: 2018-06-07 | Discharge: 2018-06-07 | Disposition: A | Discharge status: Home / Self Care | Payer: Medicare Other | Source: Ambulatory Visit | Attending: Radiation Oncology | Admitting: Radiation Oncology

## 2018-06-07 DIAGNOSIS — C7951 Secondary malignant neoplasm of bone: Secondary | ICD-10-CM | POA: Diagnosis not present

## 2018-06-08 ENCOUNTER — Ambulatory Visit: Payer: Medicare Other

## 2018-06-08 ENCOUNTER — Ambulatory Visit
Admission: RE | Admit: 2018-06-08 | Discharge: 2018-06-08 | Disposition: A | Discharge status: Home / Self Care | Payer: Medicare Other | Source: Ambulatory Visit | Attending: Radiation Oncology | Admitting: Radiation Oncology

## 2018-06-08 DIAGNOSIS — Z170 Estrogen receptor positive status [ER+]: Principal | ICD-10-CM

## 2018-06-08 DIAGNOSIS — C7951 Secondary malignant neoplasm of bone: Secondary | ICD-10-CM | POA: Diagnosis not present

## 2018-06-08 DIAGNOSIS — C50412 Malignant neoplasm of upper-outer quadrant of left female breast: Secondary | ICD-10-CM

## 2018-06-08 MED ORDER — RADIAPLEXRX EX GEL
Freq: Once | CUTANEOUS | Status: AC
  Administered 2018-06-08: 14:00:00 via TOPICAL

## 2018-06-11 ENCOUNTER — Ambulatory Visit
Admission: RE | Admit: 2018-06-11 | Discharge: 2018-06-11 | Disposition: A | Discharge status: Home / Self Care | Payer: Medicare Other | Source: Ambulatory Visit | Attending: Radiation Oncology | Admitting: Radiation Oncology

## 2018-06-11 ENCOUNTER — Ambulatory Visit: Payer: Medicare Other

## 2018-06-11 DIAGNOSIS — C7951 Secondary malignant neoplasm of bone: Secondary | ICD-10-CM | POA: Diagnosis not present

## 2018-06-12 ENCOUNTER — Ambulatory Visit
Admission: RE | Admit: 2018-06-12 | Discharge: 2018-06-12 | Disposition: A | Discharge status: Home / Self Care | Payer: Medicare Other | Source: Ambulatory Visit | Attending: Radiation Oncology | Admitting: Radiation Oncology

## 2018-06-12 DIAGNOSIS — Z170 Estrogen receptor positive status [ER+]: Principal | ICD-10-CM

## 2018-06-12 DIAGNOSIS — C50812 Malignant neoplasm of overlapping sites of left female breast: Secondary | ICD-10-CM

## 2018-06-12 DIAGNOSIS — C7951 Secondary malignant neoplasm of bone: Secondary | ICD-10-CM | POA: Diagnosis not present

## 2018-06-12 MED ORDER — SONAFINE EX EMUL
1.0000 "application " | Freq: Two times a day (BID) | CUTANEOUS | Status: DC
  Administered 2018-06-12: 1 via TOPICAL

## 2018-06-13 ENCOUNTER — Ambulatory Visit
Admission: RE | Admit: 2018-06-13 | Discharge: 2018-06-13 | Disposition: A | Discharge status: Home / Self Care | Payer: Medicare Other | Source: Ambulatory Visit | Attending: Radiation Oncology | Admitting: Radiation Oncology

## 2018-06-13 ENCOUNTER — Other Ambulatory Visit: Payer: Self-pay | Admitting: Radiation Therapy

## 2018-06-13 DIAGNOSIS — C7951 Secondary malignant neoplasm of bone: Secondary | ICD-10-CM | POA: Diagnosis not present

## 2018-06-14 ENCOUNTER — Ambulatory Visit
Admission: RE | Admit: 2018-06-14 | Discharge: 2018-06-14 | Disposition: A | Discharge status: Home / Self Care | Payer: Medicare Other | Source: Ambulatory Visit | Attending: Radiation Oncology | Admitting: Radiation Oncology

## 2018-06-14 ENCOUNTER — Ambulatory Visit: Payer: Medicare Other

## 2018-06-14 DIAGNOSIS — C7951 Secondary malignant neoplasm of bone: Secondary | ICD-10-CM

## 2018-06-14 MED ORDER — GADOBENATE DIMEGLUMINE 529 MG/ML IV SOLN
15.0000 mL | Freq: Once | INTRAVENOUS | Status: AC | PRN
  Administered 2018-06-14: 15 mL via INTRAVENOUS

## 2018-06-15 ENCOUNTER — Ambulatory Visit
Admission: RE | Admit: 2018-06-15 | Discharge: 2018-06-15 | Disposition: A | Discharge status: Home / Self Care | Payer: Medicare Other | Source: Ambulatory Visit | Attending: Radiation Oncology | Admitting: Radiation Oncology

## 2018-06-15 ENCOUNTER — Encounter: Payer: Self-pay | Admitting: Radiation Oncology

## 2018-06-15 DIAGNOSIS — C7951 Secondary malignant neoplasm of bone: Secondary | ICD-10-CM

## 2018-06-15 DIAGNOSIS — C50412 Malignant neoplasm of upper-outer quadrant of left female breast: Secondary | ICD-10-CM

## 2018-06-15 DIAGNOSIS — Z170 Estrogen receptor positive status [ER+]: Principal | ICD-10-CM

## 2018-06-15 MED ORDER — SILVER SULFADIAZINE 1 % EX CREA
TOPICAL_CREAM | Freq: Two times a day (BID) | CUTANEOUS | Status: DC
  Administered 2018-06-15: 15:00:00 via TOPICAL

## 2018-06-17 ENCOUNTER — Ambulatory Visit: Admission: RE | Admit: 2018-06-17 | Payer: Medicare Other | Source: Ambulatory Visit

## 2018-06-18 ENCOUNTER — Other Ambulatory Visit: Payer: Medicare Other

## 2018-06-18 NOTE — Progress Notes (Signed)
  Radiation Oncology         [phone] ________________________________  Name: Jessica Glenn MRN: [id number]  Date: 06/15/2018  DOB: April 21, 1929  End of Treatment Note  Diagnosis:   84 y.o. female with pT3N1M1, grade 1, ER/PR positive invasive ductal carcinoma of the left breast  Indication for treatment:  Curative       Radiation treatment dates:   05/01/2018 - 06/15/2018  Site/dose:   The patient initially received a dose of 50.4 Gy in 28 fractions to the left chest wall and supraclavicular region. This was delivered using a 3-D conformal, 4 field technique. The patient then received a boost to the mastectomy scar. This delivered an additional 10 Gy in 5 fractions using an en face electron field. The total dose was 60.4 Gy.  Narrative: The patient tolerated radiation treatment relatively well.   The patient had some expected skin irritation as she progressed during treatment. Moist desquamation was present in the left axilla at the end of treatment. She has been given Silvadene to apply to this area and will continue her current skin care elsewhere throughout the treated area.  Plan: The patient has completed radiation treatment. The patient will return to radiation oncology clinic on 06/22/18 to proceed with stereotactic radiosurgery to the T2 lesion. I advised the patient to call or return sooner if they have any questions or concerns related to their recovery or treatment. ________________________________  Jodelle Gross, MD, PhD  This document serves as a record of services personally performed by Kyung Rudd, MD. It was created on his behalf by Rae Lips, a trained medical scribe. The creation of this record is based on the scribe's personal observations and the provider's statements to them. This document has been checked and approved by the attending provider.

## 2018-06-21 DIAGNOSIS — C7951 Secondary malignant neoplasm of bone: Secondary | ICD-10-CM | POA: Diagnosis not present

## 2018-06-21 NOTE — Progress Notes (Signed)
Brain and Spine Tumor Board Documentation  Jessica Glenn was presented by Cecil Cobbs, MD at Brain and Spine Tumor Board on 06/21/2018, which included representatives from neuro oncology, radiation oncology, surgical oncology, radiology, pathology, navigation, genetics.  Jessica Glenn was presented as a current patient with history of the following treatments:  .  Additionally, we reviewed previous medical and familial history, history of present illness, and recent lab results along with all available histopathologic and imaging studies. The tumor board considered available treatment options and made the following recommendations:  Adjuvant radiation  Radiosurgery to T2 metastasis  Tumor board is a meeting of clinicians from various specialty areas who evaluate and discuss patients for whom a multidisciplinary approach is being considered. Final determinations in the plan of care are those of the provider(s). The responsibility for follow up of recommendations given during tumor board is that of the provider.   Today's extended care, comprehensive team conference, Jessica Glenn was not present for the discussion and was not examined.

## 2018-06-22 ENCOUNTER — Telehealth: Payer: Self-pay | Admitting: *Deleted

## 2018-06-22 ENCOUNTER — Ambulatory Visit: Payer: Medicare Other | Admitting: Radiation Oncology

## 2018-06-22 NOTE — Telephone Encounter (Signed)
TC from patient regarding a call she received this morning from the cancer center. She is unsure who called. Jessica Glenn states she has an appt today for a procedure with Dr. Trenton Gammon and Dr. Lisbeth Renshaw.  Reviewing her appt schedule, it appears that her appt was changed to next Tuesday d/t insurance reasons. Transferred call to [facility] in [place]. Onc as pt did not know this.  Spoke with Enid Derry and she will talk with pt.  Call transferred.

## 2018-06-26 ENCOUNTER — Ambulatory Visit: Payer: Medicare Other | Admitting: Radiation Oncology

## 2018-06-29 ENCOUNTER — Ambulatory Visit: Payer: Medicare Other | Admitting: Radiation Oncology

## 2018-07-04 NOTE — Progress Notes (Signed)
[facility]  Telephone:([phone] Fax:(336) [phone]     ID: [name] DOB: Sep 17, 1929  MR#: [id number]  WIO#:[id number]  Patient Care Team: Maury Dus, MD as PCP - General (Family Medicine) Jessika Rothery, Virgie Dad, MD as Consulting Physician (Oncology) Jovita Kussmaul, MD as Consulting Physician (General Surgery) Kyung Rudd, MD as Consulting Physician (Radiation Oncology) OTHER MD:  CHIEF COMPLAINT: Locally advanced/metastatic estrogen receptor positive left breast cancer  CURRENT TREATMENT: Anastrozole; zolendronate   HISTORY OF CURRENT ILLNESS: From the original intake note:  Jessica Glenn tells me she first noted a changes in her left breast about a year and a half ago. She had had some dental work around that time and thought possibly that could be related. She said her breast felt "like steel". She did not bring it to medical attention until her recent appointment with Dr. Alyson Ingles and he set her up for bilateral diagnostic mammography with tomography and left breast ultrasonography at the [facility] 06/19/2017. This found the breast density to be category C. In the upper outer retroareolar left breast there was an irregular mass estimated to be at least 6 cm by mammography. There were heterogeneous calcifications within the mass. There was skin thickening of the areola and an enlarged inferior left axillary lymph node was noted. On exam there was a large fixed hard mass in the upper outer quadrant of the left breast causing protrusion of the areola. It measured approximately 7 cm by palpation and there was visible skin thickening. In the inferior left axilla there was a firm palpable mass measuring 2 cm.  Ultrasound confirmed an irregular hypoechoic vascular mass in the left breast centered on the 1:00 position 4 cm from the nipple measuring 6.7 cm. This was abutting the overlying skin. It was a 2.3 cm hypoechoic vascular mass in the left axilla. The right breast was  unremarkable.  On 06/22/2017 the patient had left breast and left axillary node biopsy, showing ([id number]) both sites to be involved by invasive ductal carcinoma, grade 2, with prognostic panels on both biopsies showing estrogen receptor 100% positivity, progesterone receptor 40-50% positivity, all with strong staining intensity, MIB-1:82 percent in the breast and 15% in the lymph nodes, and both HER-2 negative, with ratios of 0.76-0.85 and number per cell 1.10-1.30.  The patient's subsequent history is as detailed below.  INTERVAL HISTORY: [name] returns today for follow-up and treatment of her locally advanced/metastatic estrogen receptor positive left breast cancer accompanied by her husband. She completed radiation treatments on 06/15/2018.  She had significant desquamation including wet desquamation and felt her chest on the left looks like a side of beef.  She is now healing over and just beginning to get a little bit of energy back.  She went off all medications doing radiation but is now ready to go back on anastrozole.  REVIEW OF SYSTEMS: Tayona had stereotactic radiosurgery to T2 today.  After that she had significant pain in that area.  She took 1 Aleve and that took care of it.  She denies unusual headaches, visual changes, nausea, vomiting, cough, phlegm production, pleurisy, shortness of breath, or change in bowel or bladder habits.  A detailed review of systems was otherwise stable.  PAST MEDICAL HISTORY: Past Medical History:  Diagnosis Date  . Basal cell carcinoma    s/p Moh's surgery  to left side of nose  . Breast cancer, left breast ([place]) dx'd 05/2017  . History of tobacco use   . Varicose vein of leg  BLE    PAST SURGICAL HISTORY: Past Surgical History:  Procedure Laterality Date  . BREAST BIOPSY Left 05/2017  . CATARACT EXTRACTION W/ INTRAOCULAR LENS  IMPLANT, BILATERAL Bilateral   . DILATION AND CURETTAGE OF UTERUS    . FRACTURE SURGERY    . HARDWARE REMOVAL  Right 02/14/2017   Procedure: RIGHT ANKLE HARDWARE REMOVAL;  Surgeon: [name], MD;  Location: [facility];  Service: Orthopedics;  Laterality: Right;  . I&D EXTREMITY Right 02/14/2017   Procedure: RIGHT IRRIGATION AND DEBRIDEMENT ANKLE;  Surgeon: [name], MD;  Location: [facility];  Service: Orthopedics;  Laterality: Right;  . MASTECTOMY Left 02/14/2018   LEFT MASTECTOMY WITH DEEP LEFT AXILLARY SENTINEL LYMPH NODE BIOPSY   . MASTECTOMY W/ SENTINEL NODE BIOPSY Left 02/14/2018   Procedure: LEFT MASTECTOMY WITH SENTINEL LYMPH NODE BIOPSY AND POSSIBLE NODE DISSECTION;  Surgeon: Jovita Kussmaul, MD;  Location: [facility];  Service: General;  Laterality: Left;  . MOHS SURGERY Left    "side of nose"  . ORIF ANKLE FRACTURE Right 02/02/2016   Procedure: OPEN REDUCTION INTERNAL FIXATION (ORIF) ANKLE FRACTURE;  Surgeon: [name], MD;  Location: [facility];  Service: Orthopedics;  Laterality: Right;  strykermini c armreg bedbone foam    FAMILY HISTORY Family History  Problem Relation Age of Onset  . COPD Mother   . Emphysema Mother   The patient's father had a history of alcoholism and a band in the family. The patient has no information on the paternal side. The patient's mother died from emphysema at the age of 61. The patient has one sister, 2 brothers. There is no history of breast or ovarian cancer in the family to her knowledge.  GYNECOLOGIC HISTORY:  No LMP recorded. Patient is postmenopausal. Menarche age 39, first live birth age 84, the patient is [place] P4. She went through the change of life in her 30s. She took hormone replacement for a few years.   SOCIAL HISTORY:  The patient is originally from [place]. Her husband Laurey Arrow is a traveling Theme park manager, Research officer, political party. Son peaked lives in [place] where he works as a Theme park manager; son Ronalee Belts lives in [place] where he works as an Optometrist; son Annie Main lives in [place] where he works as a  Chief Strategy Officer; son Rodman Key also lives in [place] and works as a Chief Strategy Officer. The patient has 14 grandchildren and 41 great-grandchildren    ADVANCED DIRECTIVES: In place   HEALTH MAINTENANCE: Social History   Tobacco Use  . Smoking status: Former Smoker    Packs/day: 1.00    Years: 8.00    Pack years: 8.00    Types: Cigarettes    Last attempt to quit: 1960    Years since quitting: 59.7  . Smokeless tobacco: Never Used  Substance Use Topics  . Alcohol use: Yes    Alcohol/week: 5.0 standard drinks    Types: 5 Glasses of wine per week  . Drug use: No     Colonoscopy:Never  PAP:  Bone density:Osteopenia   No Known Allergies  Current Outpatient Medications  Medication Sig Dispense Refill  . anastrozole (ARIMIDEX) 1 MG tablet Take 1 tablet (1 mg total) by mouth daily. (Patient taking differently: Take 1 mg by mouth every evening. ) 90 tablet 4  . ascorbic acid (VITAMIN C) 1000 MG tablet Take 1,000 mg by mouth daily.    . Calcium Carb-Cholecalciferol (CALCIUM 600 + D PO) Take 1 tablet by mouth daily.    . Calcium-Magnesium-Zinc (  CAL-MAG-ZINC PO) Take 1 tablet by mouth daily.    . Cholecalciferol (VITAMIN D3) 3000 units TABS Take 3,000 Units by mouth daily.    . Cyanocobalamin (B-12) 5000 MCG CAPS Take 5,000 mcg by mouth daily.    . diphenhydramine-acetaminophen (TYLENOL PM) 25-500 MG TABS tablet Take 1-2 tablets by mouth at bedtime.     Marland Kitchen doxylamine, Sleep, (UNISOM) 25 MG tablet Take 25 mg by mouth at bedtime as needed for sleep.    . Multiple Vitamin (MULTIVITAMIN WITH MINERALS) TABS tablet Take 1 tablet by mouth daily.    . Probiotic CAPS Take 1 capsule by mouth daily.    . Red Yeast Rice Extract (RED YEAST RICE PO) Take 1,200 mg by mouth 3 (three) times a week.    Marland Kitchen VITAMIN E PO Take 180 Units by mouth daily.     No current facility-administered medications for this visit.     OBJECTIVE: Older white woman who appears stated age  84:   07/05/18 1400  BP: (!) 141/77    Pulse: 85  Resp: 18  Temp: 98.4 F (36.9 C)  SpO2: 97%     Body mass index is 25.84 kg/m.   Wt Readings from Last 3 Encounters:  07/05/18 155 lb 4.8 oz (70.4 kg)  05/07/18 155 lb 4.8 oz (70.4 kg)  04/26/18 155 lb (70.3 kg)  ECOG FS:1 - Symptomatic but completely ambulatory   Sclerae unicteric, pupils round and equal Oropharynx clear and moist No cervical or supraclavicular adenopathy Lungs no rales or rhonchi Heart regular rate and rhythm Abd soft, nontender, positive bowel sounds MSK no focal spinal tenderness, no upper extremity lymphedema Neuro: nonfocal, well oriented, appropriate affect Breasts: The right breast is benign.  The left breast is status post mastectomy and radiation.  It is imaged below.  It is healing over well.  There is no evidence of residual disease.  Both axillae are benign.  Left chest wall 07/05/2018   LAB RESULTS:  CMP     Component Value Date/Time   NA 144 07/05/2018 1347   NA 140 10/27/2017 0908   K 4.7 07/05/2018 1347   K 4.8 10/27/2017 0908   CL 106 07/05/2018 1347   CO2 28 07/05/2018 1347   CO2 27 10/27/2017 0908   GLUCOSE 117 (H) 07/05/2018 1347   GLUCOSE 96 10/27/2017 0908   BUN 21 07/05/2018 1347   BUN 20.5 10/27/2017 0908   CREATININE 0.98 07/05/2018 1347   CREATININE 0.9 10/27/2017 0908   CALCIUM 10.0 07/05/2018 1347   CALCIUM 9.7 10/27/2017 0908   PROT 7.6 07/05/2018 1347   PROT 7.2 10/27/2017 0908   ALBUMIN 4.3 07/05/2018 1347   ALBUMIN 4.2 10/27/2017 0908   AST 15 07/05/2018 1347   AST 19 10/27/2017 0908   ALT 7 07/05/2018 1347   ALT 8 10/27/2017 0908   ALKPHOS 78 07/05/2018 1347   ALKPHOS 58 10/27/2017 0908   BILITOT 0.4 07/05/2018 1347   BILITOT 0.62 10/27/2017 0908   GFRNONAA 50 (L) 07/05/2018 1347   GFRAA 58 (L) 07/05/2018 1347    No results found for: TOTALPROTELP, ALBUMINELP, A1GS, A2GS, BETS, BETA2SER, GAMS, MSPIKE, SPEI  No results found for: KPAFRELGTCHN, LAMBDASER, KAPLAMBRATIO  Lab Results   Component Value Date   WBC 8.4 07/05/2018   NEUTROABS 6.7 (H) 07/05/2018   HGB 13.6 07/05/2018   HCT 40.7 07/05/2018   MCV 96.7 07/05/2018   PLT 136 (L) 07/05/2018     Lab Results  Component Value Date  CA2729 55.9 (H) 05/07/2018       Urinalysis No results found for: COLORURINE, APPEARANCEUR, LABSPEC, PHURINE, GLUCOSEU, HGBUR, BILIRUBINUR, KETONESUR, PROTEINUR, UROBILINOGEN, NITRITE, LEUKOCYTESUR   STUDIES: Mr Thoracic Spine W Wo Contrast  Addendum Date: 06/18/2018   ADDENDUM REPORT: 06/18/2018 12:40 ADDENDUM: I was asked to addend the report to state that the patient has not had treatment of her metastatic disease. To my review of the medical records, she has had the following treatment: Neo-adjuvant fulvestrant started on 07/05/2017. Electronically Signed   By: Nelson Chimes M.D.   On: 06/18/2018 12:40   Result Date: 06/18/2018 CLINICAL DATA:  Followup prior exam for [facility] planning. Metastatic carcinoma. EXAM: MRI THORACIC WITHOUT AND WITH CONTRAST TECHNIQUE: Multiplanar and multiecho pulse sequences of the thoracic spine were obtained without and with intravenous contrast. CONTRAST:  13m MULTIHANCE GADOBENATE DIMEGLUMINE 529 MG/ML IV SOLN COMPARISON:  MRI 03/14/2018.  PET scan 03/29/2018. FINDINGS: MRI THORACIC SPINE FINDINGS Alignment:  Normal Vertebrae: Re- demonstrated is a treated metastasis at the posterosuperior corner of the T2 vertebral body, measuring 12 mm in diameter. The lesion shows more internal cystic change than was noted on the previous exam. Small cystic area present in the left side of the T8 vertebral body, 7 mm in diameter, also presumably a treated lesion. Inferior endplate edema and enhancement at T9 with an appearance most consistent with degenerative disease/Schmorl's node formation. This would be an unusual manifestation of metastatic disease. Chronic degenerative discogenic marrow changes at T10-11. Cord:  No cord compression or primary cord lesion.  Paraspinal and other soft tissues: Negative Disc levels: No significant or compressive disc pathology. Mild disc degeneration and bulging as seen previously. IMPRESSION: Previously treated metastasis of the T2 vertebral body affecting the posterior upper corner. The lesion measures about 12 mm. Since the prior study, there is more internal cystic change. Second 7 mm focus within the left side of the T8 vertebral body at the site of a previous abnormality. Again, this shows cystic change suggesting that it is a treated lesion. Inferior endplate enhancement at the T9 level most consistent with degenerative Schmorl's node formation. This would be an unusual appearance for metastatic disease. Electronically Signed: By: MNelson ChimesM.D. On: 06/14/2018 16:34    ELIGIBLE FOR AVAILABLE RESEARCH PROTOCOL: no  ASSESSMENT: 84y.o. [place] woman status post left breast overlapping sites biopsy 06/22/2017 for a clinical T3 N1-2, stage II-III invasive ductal carcinoma, grade 2, estrogen and progesterone receptor positive, HER-2 nonamplified, with an MIB-1 of 30%  (a) staging CT of the chest and bone survey and bone scan July 11, 2017 showed multiple indeterminant subcentimeter pulmonary nodules which will require follow-up, as well as an indeterminate right hepatic lobe lesion, but no definite evidence of malignancy.  (b) CA-27-29 is informative  (1) neo-adjuvant fulvestrant started on 07/05/2017, discontinued 12/20/2017 with possible progression  (2) status post left mastectomy and sentinel lymph node sampling 02/14/2018 for a T3 N1, stage IIA invasive ductal carcinoma, grade 1, with negative margins.  Repeat prognostic panel again estrogen and progesterone receptor positive, HER-2 negative  (a) 2 of 7 removed lymph nodes were positive  (3) adjuvant radiation to follow surgery  (4) anastrozole started 01/17/2018  (a) start palbociclib once disease progression is documented  (5) METASTATIC  DISEASE:   (a) CT scan of the chest 02/05/2018 shows a new lesion at T2  (b) total spinal MRI 03/14/2018 confirms lesion at T2 and possible minimal lesion at T8, neither amenable to biopsy; there are no  other suspicious lesions  (6)  adjuvant radiation 05/01/2018 -06/15/2018  (a) received capecitabine sensitization given the extent of local disease  (b) Site/dose:   The patient initially received a dose of 50.4 Gy in 28 fractions to the left chest wall and supraclavicular region. This was delivered using a 3-D conformal, 4 field technique. The patient then received a boost to the mastectomy scar. This delivered an additional 10 Gy in 5 fractions using an en face electron field. The total dose was 60.4 Gy  (c) stereotactic radiosurgery to T2 given 07/05/2018  (7) to start zolendronate 07/11/2018, to be repeated every 12 weeks  PLAN: Lynne had significant discomfort from her radiation but we were really concerned about the possibility of local recurrence and hopefully the pain discomfort and suffering she has undergone will pay off in the long run and she will not have local recurrence.  She did well with her T2 stereotactic radiosurgery today except for some pain and that seems to be better with a little bit of Aleve.  We discussed the use of Aleve plus Tylenol for pain control if the pain recurs.  At this point she is ready to resume anastrozole.  She tolerated that previously.  The plan will be to continue that indefinitely until there is evidence of disease progression.  We also need to address the metastases to the bone.  I am going to start her on zolendronate every 12 weeks for 2 years then every 6 months for 1 or 2 years then yearly.  We discussed the possible toxicities and side effects of this medication including the rare possibility of osteonecrosis of the jaw.  She will have her first zolendronate dose 07/10/2018.  She will see [place] again in September 03, 2018.  Just before that visit she  will have repeat lab work including her CA-27-29 and she will have a bone scan.  She understands the bone scan after zolendronate make would light up" areas that were previously not well imaged and that will not mean that the cancer has grown only that it is more visible if it is there.  They will call with any other issues that may develop before the next visit.   Shamariah Shewmake, Virgie Dad, MD  07/05/18 2:42 PM Medical Oncology and Hematology [facility] [address] [place], [place] [address] Tel. [phone]    Fax. [phone]  Alice Rieger, am acting as scribe for Chauncey Cruel MD.  I, Lurline Del MD, have reviewed the above documentation for accuracy and completeness, and I agree with the above.

## 2018-07-05 ENCOUNTER — Inpatient Hospital Stay: Payer: Medicare Other | Attending: Oncology | Admitting: Oncology

## 2018-07-05 ENCOUNTER — Ambulatory Visit
Admission: RE | Admit: 2018-07-05 | Discharge: 2018-07-05 | Disposition: A | Discharge status: Home / Self Care | Payer: Medicare Other | Source: Ambulatory Visit | Attending: Radiation Oncology | Admitting: Radiation Oncology

## 2018-07-05 ENCOUNTER — Inpatient Hospital Stay: Payer: Medicare Other

## 2018-07-05 ENCOUNTER — Telehealth: Payer: Self-pay | Admitting: Oncology

## 2018-07-05 ENCOUNTER — Encounter: Payer: Self-pay | Admitting: Radiation Oncology

## 2018-07-05 VITALS — BP 159/69 | HR 72 | Temp 98.7°F | Resp 18

## 2018-07-05 VITALS — BP 141/77 | HR 85 | Temp 98.4°F | Resp 18 | Ht 65.0 in | Wt 155.3 lb

## 2018-07-05 DIAGNOSIS — C50112 Malignant neoplasm of central portion of left female breast: Secondary | ICD-10-CM | POA: Diagnosis present

## 2018-07-05 DIAGNOSIS — Z170 Estrogen receptor positive status [ER+]: Principal | ICD-10-CM

## 2018-07-05 DIAGNOSIS — Z79811 Long term (current) use of aromatase inhibitors: Secondary | ICD-10-CM | POA: Diagnosis not present

## 2018-07-05 DIAGNOSIS — Z510 Encounter for antineoplastic radiation therapy: Secondary | ICD-10-CM | POA: Diagnosis not present

## 2018-07-05 DIAGNOSIS — C773 Secondary and unspecified malignant neoplasm of axilla and upper limb lymph nodes: Secondary | ICD-10-CM | POA: Insufficient documentation

## 2018-07-05 DIAGNOSIS — C7951 Secondary malignant neoplasm of bone: Secondary | ICD-10-CM | POA: Insufficient documentation

## 2018-07-05 DIAGNOSIS — C50912 Malignant neoplasm of unspecified site of left female breast: Secondary | ICD-10-CM

## 2018-07-05 DIAGNOSIS — C50812 Malignant neoplasm of overlapping sites of left female breast: Secondary | ICD-10-CM

## 2018-07-05 LAB — COMPREHENSIVE METABOLIC PANEL
ALT: 7 U/L (ref 0–44)
ANION GAP: 10 (ref 5–15)
AST: 15 U/L (ref 15–41)
Albumin: 4.3 g/dL (ref 3.5–5.0)
Alkaline Phosphatase: 78 U/L (ref 38–126)
BUN: 21 mg/dL (ref 8–23)
CO2: 28 mmol/L (ref 22–32)
Calcium: 10 mg/dL (ref 8.9–10.3)
Chloride: 106 mmol/L (ref 98–111)
Creatinine, Ser: 0.98 mg/dL (ref 0.44–1.00)
GFR, EST AFRICAN AMERICAN: 58 mL/min — AB (ref >60)
GFR, EST NON AFRICAN AMERICAN: 50 mL/min — AB (ref >60)
Glucose, Bld: 117 mg/dL — ABNORMAL HIGH (ref 70–99)
Potassium: 4.7 mmol/L (ref 3.5–5.1)
Sodium: 144 mmol/L (ref 135–145)
Total Bilirubin: 0.4 mg/dL (ref 0.3–1.2)
Total Protein: 7.6 g/dL (ref 6.5–8.1)

## 2018-07-05 LAB — CBC WITH DIFFERENTIAL/PLATELET
Basophils Absolute: 0 10*3/uL (ref 0.0–0.1)
Basophils Relative: 0 %
Eosinophils Absolute: 0.1 10*3/uL (ref 0.0–0.5)
Eosinophils Relative: 2 %
HCT: 40.7 % (ref 34.8–46.6)
Hemoglobin: 13.6 g/dL (ref 11.6–15.9)
LYMPHS ABS: 0.7 10*3/uL — AB (ref 0.9–3.3)
LYMPHS PCT: 8 %
MCH: 32.3 pg (ref 25.1–34.0)
MCHC: 33.4 g/dL (ref 31.5–36.0)
MCV: 96.7 fL (ref 79.5–101.0)
MONO ABS: 0.9 10*3/uL (ref 0.1–0.9)
MONOS PCT: 10 %
Neutro Abs: 6.7 10*3/uL — ABNORMAL HIGH (ref 1.5–6.5)
Neutrophils Relative %: 80 %
Platelets: 136 10*3/uL — ABNORMAL LOW (ref 145–400)
RBC: 4.21 MIL/uL (ref 3.70–5.45)
RDW: 16.8 % — ABNORMAL HIGH (ref 11.2–14.5)
WBC: 8.4 10*3/uL (ref 3.9–10.3)

## 2018-07-05 NOTE — Progress Notes (Addendum)
Jessica Glenn rested with [place] for 20 minutes following her SRS treatment.  Patient denies headache, dizziness, nausea, diplopia or ringing in the ears. Denies fatigue. Patient without complaints. Understands to avoid strenuous activity for the next 24 hours and call [phone] with needs.  Patient given instructions by Mont Dutton RN Navigator.  Will continue to follow as necessary.  BP (!) 159/69   Pulse 72   Temp 98.7 F (37.1 C) (Oral)   Resp 18   SpO2 99%    Jessica Glenn M. [name], BSN

## 2018-07-05 NOTE — Telephone Encounter (Signed)
Gave pt avs and calendar  °

## 2018-07-05 NOTE — Progress Notes (Signed)
   Name: Jessica [facility]  MRN: [id number]  Date: 07/05/2018   DOB: 1929/06/25  Stereotactic Radiosurgery Operative Note  PRE-OPERATIVE DIAGNOSIS:  Spinal Metastasis  POST-OPERATIVE DIAGNOSIS:  Spinal Metastasis  PROCEDURE:  Stereotactic Radiosurgery  SURGEON:  Charlie Pitter, MD  NARRATIVE: The patient underwent a radiation treatment planning session in the radiation oncology simulation suite under the care of the radiation oncology physician and physicist.  I participated closely in the radiation treatment planning afterwards. The patient underwent planning CT myelogram which was fused to the MRI.  These images were fused on the planning system.  Radiation oncology contoured the gross target volume and subsequently expanded this to yield the Planning Target Volume. I actively participated in the planning process.  I helped to define and review the target contours and also the contours of the spinal cord, and selected nearby organs at risk.  All the dose constraints for critical structures were reviewed and compared to AAPM Task Group 101.  The prescription dose conformity was reviewed.  I approved the plan electronically.    Accordingly, [name] was brought to the TrueBeam stereotactic radiation treatment linac and placed in the custom immobilization device.  The patient was aligned according to the IR fiducial markers with BrainLab Exactrac, then orthogonal x-rays were used in ExacTrac with the 6DOF robotic table and the shifts were made to align the patient.  Then conebeam CT was performed to verify precision.  [name] received stereotactic radiosurgery uneventfully.  The detailed description of the procedure is recorded in the radiation oncology procedure note.  I was present for the duration of the procedure.  DISPOSITION:  Following delivery, the patient was transported to nursing in stable condition and monitored for possible acute effects to be discharged to home in stable condition with  follow-up in one month.  Charlie Pitter, MD 07/05/2018 8:41 AM

## 2018-07-06 LAB — CANCER ANTIGEN 27.29: CA 27.29: 61.6 U/mL — ABNORMAL HIGH (ref 0.0–38.6)

## 2018-07-10 ENCOUNTER — Inpatient Hospital Stay: Payer: Medicare Other

## 2018-07-10 VITALS — BP 129/83 | HR 71 | Temp 98.7°F | Resp 17

## 2018-07-10 DIAGNOSIS — C50912 Malignant neoplasm of unspecified site of left female breast: Secondary | ICD-10-CM

## 2018-07-10 DIAGNOSIS — Z170 Estrogen receptor positive status [ER+]: Principal | ICD-10-CM

## 2018-07-10 DIAGNOSIS — C50812 Malignant neoplasm of overlapping sites of left female breast: Secondary | ICD-10-CM

## 2018-07-10 DIAGNOSIS — C50112 Malignant neoplasm of central portion of left female breast: Secondary | ICD-10-CM | POA: Diagnosis not present

## 2018-07-10 MED ORDER — SODIUM CHLORIDE 0.9 % IV SOLN
Freq: Once | INTRAVENOUS | Status: AC
  Administered 2018-07-10: 15:00:00 via INTRAVENOUS
  Filled 2018-07-10: qty 250

## 2018-07-10 MED ORDER — ZOLEDRONIC ACID 4 MG/5ML IV CONC
3.3000 mg | Freq: Once | INTRAVENOUS | Status: AC
  Administered 2018-07-10: 3.3 mg via INTRAVENOUS
  Filled 2018-07-10: qty 4.13

## 2018-07-10 NOTE — Patient Instructions (Signed)

## 2018-07-11 ENCOUNTER — Other Ambulatory Visit: Payer: Self-pay | Admitting: Oncology

## 2018-07-17 DIAGNOSIS — C7951 Secondary malignant neoplasm of bone: Secondary | ICD-10-CM | POA: Insufficient documentation

## 2018-07-17 NOTE — Progress Notes (Signed)
  Radiation Oncology         [phone] ________________________________  Name: Jessica [facility] MRN: [id number]  Date: 07/05/2018  DOB: May 21, 1929   SPECIAL TREATMENT PROCEDURE   3D TREATMENT PLANNING AND DOSIMETRY: The patient's radiation plan was reviewed and approved by Dr. Annette Stable from neurosurgery and radiation oncology prior to treatment. It showed 3-dimensional radiation distributions overlaid onto the planning CT/MRI image set. The [facility] for the target structures as well as the organs at risk were reviewed. The documentation of the 3D plan and dosimetry are filed in the radiation oncology EMR.   NARRATIVE: The patient was brought to the TrueBeam stereotactic radiation treatment machine and placed supine on the CT couch. The patient was placed in the BodyFix bag to set up the patient for stereotactic radiosurgery and vacuum immobilization was applied. Neurosurgery was present for the set-up and delivery   SIMULATION VERIFICATION: In the couch zero-angle position, the patient underwent Exactrac imaging using the Brainlab system with orthogonal KV images. These were carefully aligned and repeated to confirm treatment position for each of the isocenters. The Exactrac snap film verification was repeated at each couch angle.   SPECIAL TREATMENT PROCEDURE: The patient received stereotactic radiosurgery to the following targets:  PTV1 T2 target target was treated using 3 Arcs to a prescription dose of 18 Gy. ExacTrac Snap verification was performed for each couch angle.   STEREOTACTIC TREATMENT MANAGEMENT: Following delivery, the patient was transported to nursing in stable condition and monitored for possible acute effects. Vital signs were recorded . The patient tolerated treatment without significant acute effects, and was discharged to home in stable condition.  PLAN: Follow-up in one month.   ------------------------------------------------  Jodelle Gross, MD, PhD

## 2018-07-17 NOTE — Progress Notes (Signed)
  Radiation Oncology         [phone] ________________________________  Name: Jessica [facility] MRN: [id number]  Date: 07/05/2018  DOB: May 27, 1929  SRS End of Treatment Note  Diagnosis:   84 y.o. female with Progressive Stage II-III, T3N1M1, grade 1, ER/PR positive invasive ductal carcinoma of the left breast with T2 lesion    Indication for treatment:  palliative       Radiation treatment dates:   07/05/2018  Site/dose:   T2 Spine // 18 Gy in 1 fraction  Beams/energy:   SBRT/SRT-IMRT // 10X-FFF Photon  Narrative: The patient tolerated radiation treatment well.   There were no signs of acute toxicity after treatment.  Plan: The patient has completed radiation treatment. The patient will return to radiation oncology clinic for routine followup in one month. I advised the patient to call or return sooner if they have any questions or concerns related to their recovery or treatment. ________________________________  Jodelle Gross, MD, PhD  This document serves as a record of services personally performed by Kyung Rudd, MD. It was created on his behalf by Rae Lips, a trained medical scribe. The creation of this record is based on the scribe's personal observations and the provider's statements to them. This document has been checked and approved by the attending provider.

## 2018-07-17 NOTE — Progress Notes (Signed)
  Radiation Oncology         [phone] ________________________________  Name: LANEISHA MINO MRN: [id number]  Date: 06/15/2018  DOB: 1929-04-24  SIMULATION AND TREATMENT PLANNING NOTE  DIAGNOSIS:     ICD-10-CM   1. Malignant neoplasm of upper-outer quadrant of left breast in female, estrogen receptor positive (HCC) C50.412 DISCONTINUED: silver sulfADIAZINE (SILVADENE) 1 % cream   Z17.0   2. Bone metastasis ([facility]) C79.51     Site:  Spine at the T2 level  NARRATIVE:  The patient was brought to the [facility].  Identity was confirmed.  All relevant records and images related to the planned course of therapy were reviewed.   Written consent to proceed with treatment was confirmed which was freely given after reviewing the details related to the planned course of therapy had been reviewed with the patient.  Then, the patient was set-up in a stable reproducible supine position for radiation therapy.  CT images were obtained.  Surface markings were placed.    Medically necessary complex treatment device(s) for immobilization:  1.  customized body-fix device, 2.  customized accuform device.   The CT images were loaded into the planning software.  Then the target and avoidance structures were contoured.  Treatment planning then occurred.  The radiation prescription was entered and confirmed.   The patient will receive a course of stereotactic radiosurgery to the spine. I am therefore requesting a 3-D conformal technique. The target volume has been contoured in addition to the spinal cord and surrounding critical at risk organs.  Dose volume histograms of each of these structures will be carefully reviewed as part of the 3-D conformal technique.   PLAN:  The patient will receive 18 Gy in 1 fraction(s).  ________________________________   Jodelle Gross, MD, PhD

## 2018-07-30 ENCOUNTER — Ambulatory Visit: Payer: Self-pay | Admitting: Radiation Oncology

## 2018-07-30 ENCOUNTER — Encounter (HOSPITAL_COMMUNITY)
Admission: RE | Admit: 2018-07-30 | Discharge: 2018-07-30 | Disposition: A | Discharge status: Home / Self Care | Payer: Medicare Other | Source: Ambulatory Visit | Attending: Oncology | Admitting: Oncology

## 2018-07-30 DIAGNOSIS — C50812 Malignant neoplasm of overlapping sites of left female breast: Secondary | ICD-10-CM | POA: Insufficient documentation

## 2018-07-30 DIAGNOSIS — Z170 Estrogen receptor positive status [ER+]: Secondary | ICD-10-CM | POA: Diagnosis present

## 2018-07-30 MED ORDER — TECHNETIUM TC 99M MEDRONATE IV KIT
20.0000 | PACK | Freq: Once | INTRAVENOUS | Status: AC | PRN
  Administered 2018-07-30: 21.6 via INTRAVENOUS

## 2018-07-31 ENCOUNTER — Other Ambulatory Visit: Payer: Self-pay | Admitting: Oncology

## 2018-08-13 ENCOUNTER — Ambulatory Visit: Payer: Medicare Other | Admitting: Radiation Oncology

## 2018-08-31 ENCOUNTER — Inpatient Hospital Stay: Payer: Medicare Other | Attending: Oncology

## 2018-08-31 DIAGNOSIS — R918 Other nonspecific abnormal finding of lung field: Secondary | ICD-10-CM | POA: Insufficient documentation

## 2018-08-31 DIAGNOSIS — Z79811 Long term (current) use of aromatase inhibitors: Secondary | ICD-10-CM | POA: Diagnosis not present

## 2018-08-31 DIAGNOSIS — Z79899 Other long term (current) drug therapy: Secondary | ICD-10-CM | POA: Diagnosis not present

## 2018-08-31 DIAGNOSIS — Z9012 Acquired absence of left breast and nipple: Secondary | ICD-10-CM | POA: Diagnosis not present

## 2018-08-31 DIAGNOSIS — Z923 Personal history of irradiation: Secondary | ICD-10-CM | POA: Insufficient documentation

## 2018-08-31 DIAGNOSIS — Z170 Estrogen receptor positive status [ER+]: Secondary | ICD-10-CM | POA: Insufficient documentation

## 2018-08-31 DIAGNOSIS — C50912 Malignant neoplasm of unspecified site of left female breast: Secondary | ICD-10-CM

## 2018-08-31 DIAGNOSIS — Z87891 Personal history of nicotine dependence: Secondary | ICD-10-CM | POA: Diagnosis not present

## 2018-08-31 DIAGNOSIS — C50812 Malignant neoplasm of overlapping sites of left female breast: Secondary | ICD-10-CM | POA: Diagnosis not present

## 2018-08-31 LAB — COMPREHENSIVE METABOLIC PANEL
ALK PHOS: 62 U/L (ref 38–126)
ALT: 9 U/L (ref 0–44)
ANION GAP: 10 (ref 5–15)
AST: 18 U/L (ref 15–41)
Albumin: 4.1 g/dL (ref 3.5–5.0)
BUN: 18 mg/dL (ref 8–23)
CALCIUM: 9.2 mg/dL (ref 8.9–10.3)
CO2: 26 mmol/L (ref 22–32)
CREATININE: 0.89 mg/dL (ref 0.44–1.00)
Chloride: 106 mmol/L (ref 98–111)
GFR, EST NON AFRICAN AMERICAN: 57 mL/min — AB (ref >60)
Glucose, Bld: 103 mg/dL — ABNORMAL HIGH (ref 70–99)
Potassium: 4.5 mmol/L (ref 3.5–5.1)
SODIUM: 142 mmol/L (ref 135–145)
TOTAL PROTEIN: 7.3 g/dL (ref 6.5–8.1)
Total Bilirubin: 0.6 mg/dL (ref 0.3–1.2)

## 2018-08-31 LAB — CBC WITH DIFFERENTIAL/PLATELET
Abs Immature Granulocytes: 0.01 10*3/uL (ref 0.00–0.07)
BASOS ABS: 0 10*3/uL (ref 0.0–0.1)
BASOS PCT: 1 %
EOS ABS: 0.1 10*3/uL (ref 0.0–0.5)
EOS PCT: 3 %
HEMATOCRIT: 41.3 % (ref 36.0–46.0)
Hemoglobin: 13.3 g/dL (ref 12.0–15.0)
IMMATURE GRANULOCYTES: 0 %
LYMPHS ABS: 1 10*3/uL (ref 0.7–4.0)
Lymphocytes Relative: 20 %
MCH: 30.7 pg (ref 26.0–34.0)
MCHC: 32.2 g/dL (ref 30.0–36.0)
MCV: 95.4 fL (ref 80.0–100.0)
Monocytes Absolute: 0.6 10*3/uL (ref 0.1–1.0)
Monocytes Relative: 11 %
NEUTROS PCT: 65 %
NRBC: 0 % (ref 0.0–0.2)
Neutro Abs: 3.5 10*3/uL (ref 1.7–7.7)
PLATELETS: 139 10*3/uL — AB (ref 150–400)
RBC: 4.33 MIL/uL (ref 3.87–5.11)
RDW: 12.3 % (ref 11.5–15.5)
WBC: 5.2 10*3/uL (ref 4.0–10.5)

## 2018-09-03 ENCOUNTER — Telehealth: Payer: Self-pay | Admitting: Adult Health

## 2018-09-03 ENCOUNTER — Encounter: Payer: Self-pay | Admitting: Radiation Oncology

## 2018-09-03 ENCOUNTER — Ambulatory Visit
Admission: RE | Admit: 2018-09-03 | Discharge: 2018-09-03 | Disposition: A | Discharge status: Home / Self Care | Payer: Medicare Other | Source: Ambulatory Visit | Attending: Radiation Oncology | Admitting: Radiation Oncology

## 2018-09-03 ENCOUNTER — Other Ambulatory Visit: Payer: Self-pay

## 2018-09-03 ENCOUNTER — Inpatient Hospital Stay: Payer: Medicare Other | Admitting: Adult Health

## 2018-09-03 ENCOUNTER — Encounter: Payer: Self-pay | Admitting: Adult Health

## 2018-09-03 VITALS — BP 141/62 | HR 76 | Temp 97.8°F | Resp 18 | Ht 65.0 in | Wt 160.4 lb

## 2018-09-03 DIAGNOSIS — Z79899 Other long term (current) drug therapy: Secondary | ICD-10-CM | POA: Insufficient documentation

## 2018-09-03 DIAGNOSIS — C50412 Malignant neoplasm of upper-outer quadrant of left female breast: Secondary | ICD-10-CM

## 2018-09-03 DIAGNOSIS — C50912 Malignant neoplasm of unspecified site of left female breast: Secondary | ICD-10-CM

## 2018-09-03 DIAGNOSIS — C7951 Secondary malignant neoplasm of bone: Secondary | ICD-10-CM

## 2018-09-03 DIAGNOSIS — R918 Other nonspecific abnormal finding of lung field: Secondary | ICD-10-CM | POA: Diagnosis not present

## 2018-09-03 DIAGNOSIS — Z923 Personal history of irradiation: Secondary | ICD-10-CM | POA: Insufficient documentation

## 2018-09-03 DIAGNOSIS — C50812 Malignant neoplasm of overlapping sites of left female breast: Secondary | ICD-10-CM | POA: Diagnosis not present

## 2018-09-03 DIAGNOSIS — Z9012 Acquired absence of left breast and nipple: Secondary | ICD-10-CM | POA: Diagnosis not present

## 2018-09-03 DIAGNOSIS — Z79811 Long term (current) use of aromatase inhibitors: Secondary | ICD-10-CM

## 2018-09-03 DIAGNOSIS — Z87891 Personal history of nicotine dependence: Secondary | ICD-10-CM

## 2018-09-03 DIAGNOSIS — Z170 Estrogen receptor positive status [ER+]: Secondary | ICD-10-CM | POA: Diagnosis not present

## 2018-09-03 NOTE — Progress Notes (Signed)
[facility]  [phone] [phone]     ID: Jessica Glenn DOB: [birth year]  MR#: [id number]  URK#:[id number]  Patient Care Team: Maury Dus, MD as PCP - General (Family Medicine) Magrinat, Virgie Dad, MD as Consulting Physician (Oncology) Jovita Kussmaul, MD as Consulting Physician (General Surgery) Kyung Rudd, MD as Consulting Physician (Radiation Oncology) OTHER MD:  CHIEF COMPLAINT: Locally advanced/metastatic estrogen receptor positive left breast cancer  CURRENT TREATMENT: Anastrozole; zolendronate   HISTORY OF CURRENT ILLNESS: From the original intake note:  Jessica Glenn tells me she first noted a changes in her left breast about a year and a half ago. She had had some dental work around that time and thought possibly that could be related. She said her breast felt "like steel". She did not bring it to medical attention until her recent appointment with Dr. Alyson Ingles and he set her up for bilateral diagnostic mammography with tomography and left breast ultrasonography at the [facility] 06/19/2017. This found the breast density to be category C. In the upper outer retroareolar left breast there was an irregular mass estimated to be at least 6 cm by mammography. There were heterogeneous calcifications within the mass. There was skin thickening of the areola and an enlarged inferior left axillary lymph node was noted. On exam there was a large fixed hard mass in the upper outer quadrant of the left breast causing protrusion of the areola. It measured approximately 7 cm by palpation and there was visible skin thickening. In the inferior left axilla there was a firm palpable mass measuring 2 cm.  Ultrasound confirmed an irregular hypoechoic vascular mass in the left breast centered on the 1:00 position 4 cm from the nipple measuring 6.7 cm. This was abutting the overlying skin. It was a 2.3 cm hypoechoic vascular mass in the left axilla. The right breast was  unremarkable.  On 06/22/2017 the patient had left breast and left axillary node biopsy, showing ([id number]) both sites to be involved by invasive ductal carcinoma, grade 2, with prognostic panels on both biopsies showing estrogen receptor 100% positivity, progesterone receptor 40-50% positivity, all with strong staining intensity, MIB-1:30 percent in the breast and 15% in the lymph nodes, and both HER-2 negative, with ratios of 0.76-0.85 and number per cell 1.10-1.30.  The patient's subsequent history is as detailed below.  INTERVAL HISTORY: Jessica Glenn returns today for follow up of her metastatic breast cancer.  She is doing well today.  She is taking Anastrozole daily.  She is tolerating this well.  She also receives Zolendronate and tolerates it well also.  She denies any dental concerns and sees her dentist regularly.    REVIEW OF SYSTEMS: Jessica Glenn denies any new pain, unusual headaches, vision changes.  She is without any appetite changes, nausea, vomiting, constipation, diarrhea, chest pain, cough, shortness of breath.  A detailed ROS was otherwise non contributory.    PAST MEDICAL HISTORY: Past Medical History:  Diagnosis Date  . Basal cell carcinoma    s/p Moh's surgery  to left side of nose  . Breast cancer, left breast ([facility]) dx'd 05/2017  . History of tobacco use   . Varicose vein of leg    BLE    PAST SURGICAL HISTORY: Past Surgical History:  Procedure Laterality Date  . BREAST BIOPSY Left 05/2017  . CATARACT EXTRACTION W/ INTRAOCULAR LENS  IMPLANT, BILATERAL Bilateral   . DILATION AND CURETTAGE OF UTERUS    . FRACTURE SURGERY    . HARDWARE REMOVAL  Right 02/14/2017   Procedure: RIGHT ANKLE HARDWARE REMOVAL;  Surgeon: [name], MD;  Location: [facility];  Service: Orthopedics;  Laterality: Right;  . I&D EXTREMITY Right 02/14/2017   Procedure: RIGHT IRRIGATION AND DEBRIDEMENT ANKLE;  Surgeon: [name], MD;  Location: [facility];  Service:  Orthopedics;  Laterality: Right;  . MASTECTOMY Left 02/14/2018   LEFT MASTECTOMY WITH DEEP LEFT AXILLARY SENTINEL LYMPH NODE BIOPSY   . MASTECTOMY W/ SENTINEL NODE BIOPSY Left 02/14/2018   Procedure: LEFT MASTECTOMY WITH SENTINEL LYMPH NODE BIOPSY AND POSSIBLE NODE DISSECTION;  Surgeon: Jovita Kussmaul, MD;  Location: [facility];  Service: General;  Laterality: Left;  . MOHS SURGERY Left    "side of nose"  . ORIF ANKLE FRACTURE Right 02/02/2016   Procedure: OPEN REDUCTION INTERNAL FIXATION (ORIF) ANKLE FRACTURE;  Surgeon: [name], MD;  Location: [facility];  Service: Orthopedics;  Laterality: Right;  strykermini c armreg bedbone foam    FAMILY HISTORY Family History  Problem Relation Age of Onset  . COPD Mother   . Emphysema Mother   The patient's father had a history of alcoholism and a band in the family. The patient has no information on the paternal side. The patient's mother died from emphysema at the age of 44. The patient has one sister, 2 brothers. There is no history of breast or ovarian cancer in the family to her knowledge.  GYNECOLOGIC HISTORY:  No LMP recorded. Patient is postmenopausal. Menarche age 80, first live birth age 57, the patient is Jessica Glenn P4. She went through the change of life in her 4s. She took hormone replacement for a few years.   SOCIAL HISTORY:  The patient is originally from [place]. Her husband Laurey Arrow is a traveling Theme park manager, Research officer, political party. Son peaked lives in [place] where he works as a Theme park manager; son Ronalee Belts lives in [place] where he works as an Optometrist; son Annie Main lives in [place] where he works as a Chief Strategy Officer; son Rodman Key also lives in [place] and works as a Chief Strategy Officer. The patient has 14 grandchildren and 48 great-grandchildren    ADVANCED DIRECTIVES: In place   HEALTH MAINTENANCE: Social History   Tobacco Use  . Smoking status: Former Smoker    Packs/day: 1.00    Years: 8.00    Pack years: 8.00    Types: Cigarettes      Last attempt to quit: 1960    Years since quitting: 59.8  . Smokeless tobacco: Never Used  Substance Use Topics  . Alcohol use: Yes    Alcohol/week: 5.0 standard drinks    Types: 5 Glasses of wine per week  . Drug use: No     Colonoscopy:Never  PAP:  Bone density:Osteopenia   No Known Allergies  Current Outpatient Medications  Medication Sig Dispense Refill  . anastrozole (ARIMIDEX) 1 MG tablet Take 1 tablet (1 mg total) by mouth daily. (Patient taking differently: Take 1 mg by mouth every evening. ) 90 tablet 4  . ascorbic acid (VITAMIN C) 1000 MG tablet Take 1,000 mg by mouth daily.    . Calcium Carb-Cholecalciferol (CALCIUM 600 + D PO) Take 1 tablet by mouth daily.    . Calcium-Magnesium-Zinc (CAL-MAG-ZINC PO) Take 1 tablet by mouth daily.    . Cholecalciferol (VITAMIN D3) 3000 units TABS Take 3,000 Units by mouth daily.    . Cyanocobalamin (B-12) 5000 MCG CAPS Take 5,000 mcg by mouth daily.    . diphenhydramine-acetaminophen (TYLENOL PM) 25-500 MG TABS tablet  Take 1-2 tablets by mouth at bedtime.     Marland Kitchen doxylamine, Sleep, (UNISOM) 25 MG tablet Take 25 mg by mouth at bedtime as needed for sleep.    . Multiple Vitamin (MULTIVITAMIN WITH MINERALS) TABS tablet Take 1 tablet by mouth daily.    . Probiotic CAPS Take 1 capsule by mouth daily.    . Red Yeast Rice Extract (RED YEAST RICE PO) Take 1,200 mg by mouth 3 (three) times a week.    Marland Kitchen VITAMIN E PO Take 180 Units by mouth daily.     No current facility-administered medications for this visit.     OBJECTIVE:  Vitals:   09/03/18 1414  BP: (!) 141/62  Pulse: 76  Resp: 18  Temp: 97.8 F (36.6 C)  SpO2: 98%     Body mass index is 26.69 kg/m.   Wt Readings from Last 3 Encounters:  09/03/18 160 lb 6.4 oz (72.8 kg)  07/05/18 155 lb 4.8 oz (70.4 kg)  05/07/18 155 lb 4.8 oz (70.4 kg)  ECOG FS:1 - Symptomatic but completely ambulatory   GENERAL: Patient is a well appearing female in no acute distress HEENT:  Sclerae  anicteric.  Oropharynx clear and moist. No ulcerations or evidence of oropharyngeal candidiasis. Neck is supple.  NODES:  No cervical, supraclavicular, or axillary lymphadenopathy palpated.  BREAST EXAM:  Left breast s/p mastectomy, no sign of local recurrence, right breast benign LUNGS:  Clear to auscultation bilaterally.  No wheezes or rhonchi. HEART:  Regular rate and rhythm. No murmur appreciated. ABDOMEN:  Soft, nontender.  Positive, normoactive bowel sounds. No organomegaly palpated. MSK:  No focal spinal tenderness to palpation. Full range of motion bilaterally in the upper extremities. EXTREMITIES:  No peripheral edema.   SKIN:  Clear with no obvious rashes or skin changes. No nail dyscrasia. NEURO:  Nonfocal. Well oriented.  Appropriate affect.     LAB RESULTS:  CMP     Component Value Date/Time   NA 142 08/31/2018 1245   NA 140 10/27/2017 0908   K 4.5 08/31/2018 1245   K 4.8 10/27/2017 0908   CL 106 08/31/2018 1245   CO2 26 08/31/2018 1245   CO2 27 10/27/2017 0908   GLUCOSE 103 (H) 08/31/2018 1245   GLUCOSE 96 10/27/2017 0908   BUN 18 08/31/2018 1245   BUN 20.5 10/27/2017 0908   CREATININE 0.89 08/31/2018 1245   CREATININE 0.9 10/27/2017 0908   CALCIUM 9.2 08/31/2018 1245   CALCIUM 9.7 10/27/2017 0908   PROT 7.3 08/31/2018 1245   PROT 7.2 10/27/2017 0908   ALBUMIN 4.1 08/31/2018 1245   ALBUMIN 4.2 10/27/2017 0908   AST 18 08/31/2018 1245   AST 19 10/27/2017 0908   ALT 9 08/31/2018 1245   ALT 8 10/27/2017 0908   ALKPHOS 62 08/31/2018 1245   ALKPHOS 58 10/27/2017 0908   BILITOT 0.6 08/31/2018 1245   BILITOT 0.62 10/27/2017 0908   GFRNONAA 57 (L) 08/31/2018 1245   GFRAA >60 08/31/2018 1245    No results found for: TOTALPROTELP, ALBUMINELP, A1GS, A2GS, BETS, BETA2SER, GAMS, MSPIKE, SPEI  No results found for: KPAFRELGTCHN, LAMBDASER, KAPLAMBRATIO  Lab Results  Component Value Date   WBC 5.2 08/31/2018   NEUTROABS 3.5 08/31/2018   HGB 13.3 08/31/2018    HCT 41.3 08/31/2018   MCV 95.4 08/31/2018   PLT 139 (L) 08/31/2018     Lab Results  Component Value Date   CA2729 61.6 (H) 07/05/2018       Urinalysis No results found  for: COLORURINE, APPEARANCEUR, LABSPEC, PHURINE, GLUCOSEU, HGBUR, BILIRUBINUR, KETONESUR, PROTEINUR, UROBILINOGEN, NITRITE, LEUKOCYTESUR   STUDIES: No results found.  ELIGIBLE FOR AVAILABLE RESEARCH PROTOCOL: no  ASSESSMENT: 84 y.o. [place] woman status post left breast overlapping sites biopsy 06/22/2017 for a clinical T3 N1-2, stage II-III invasive ductal carcinoma, grade 2, estrogen and progesterone receptor positive, HER-2 nonamplified, with an MIB-1 of 30%  (a) staging CT of the chest and bone survey and bone scan July 11, 2017 showed multiple indeterminant subcentimeter pulmonary nodules which will require follow-up, as well as an indeterminate right hepatic lobe lesion, but no definite evidence of malignancy.  (b) CA-27-29 is informative  (1) neo-adjuvant fulvestrant started on 07/05/2017, discontinued 12/20/2017 with possible progression  (2) status post left mastectomy and sentinel lymph node sampling 02/14/2018 for a T3 N1, stage IIA invasive ductal carcinoma, grade 1, with negative margins.  Repeat prognostic panel again estrogen and progesterone receptor positive, HER-2 negative  (a) 2 of 7 removed lymph nodes were positive  (3) adjuvant radiation to follow surgery  (4) anastrozole started 01/17/2018  (a) start palbociclib once disease progression is documented  (5) METASTATIC DISEASE:   (a) CT scan of the chest 02/05/2018 shows a new lesion at T2  (b) total spinal MRI 03/14/2018 confirms lesion at T2 and possible minimal lesion at T8, neither amenable to biopsy; there are no other suspicious lesions  (6)  adjuvant radiation 05/01/2018 -06/15/2018  (a) received capecitabine sensitization given the extent of local disease  (b) Site/dose:   The patient initially received a dose of 50.4  Gy in 28 fractions to the left chest wall and supraclavicular region. This was delivered using a 3-D conformal, 4 field technique. The patient then received a boost to the mastectomy scar. This delivered an additional 10 Gy in 5 fractions using an en face electron field. The total dose was 60.4 Gy  (c) stereotactic radiosurgery to T2 given 07/05/2018  (7) to start zolendronate 07/11/2018, to be repeated every 12 weeks  PLAN: Jessica Glenn is doing well today.  She is taking Anastrozole and is tolerating it well.  She is also tolerating the zolendronate well.  Her labs are stable.  Her last bone scan was normal.  She will continue with the Zolendronate every 12 weeks, and Dr. Jana Hakim will see her back in May, after a bone scan and prior to her next Zolendronate dosage.    [place] understands the above in its entirety.  She knows to call for any questions or concerns whatsoever that may develop before the next visit.  I reviewed the above plan with Dr. Jana Hakim in detail.    A total of (20) minutes of face-to-face time was spent with this patient with greater than 50% of that time in counseling and care-coordination.   Wilber Bihari, NP 09/03/18 2:34 PM Medical Oncology and Hematology [facility] [address] [place], [place] [address] Tel. [phone]    Fax. [phone]

## 2018-09-03 NOTE — Telephone Encounter (Signed)
Gave pt avs and calendar  °

## 2018-09-03 NOTE — Progress Notes (Signed)
Radiation Oncology         [phone] ________________________________  Name: Jessica Glenn MRN: [id number]  Date of Service: 09/03/2018  DOB: 01-04-1929  Post Treatment Note  CC: Maury Dus, MD  Magrinat, Virgie Dad, MD  Diagnosis: Progressive metastatic stage II-III, T3 N0 M1 grade 1 ER/PR Positive invasive ductal carcinoma of the left breast with a T2 lesion  Interval Since Last Radiation: 9 weeks   07/05/2018 Stereotactic Radiosurgery: T2 Spine // 18 Gy in 1 fraction  05/01/2018 - 06/15/2018: The patient initially received a dose of 50.4 Gy in 28 fractions to the left chest wall and supraclavicular region. This was delivered using a 3-D conformal, 4 field technique. The patient then received a boost to the mastectomy scar. This delivered an additional 10 Gy in 5 fractions using an en face electron field. The total dose was 60.4 Gy.  Narrative: In summary the patient was diagnosed with stage II or stage III breast disease initially, and on staging imaging was found to have a lytic appearing lesion at T2.  She went on to proceed with mastectomy of the left breast followed by postmastectomy radiation to the left chest wall and regional nodes.  She then went on to receive stereotactic radiosurgery to the T-spine and one fraction.  She continues with bone directed therapy as well as anastrozole.  The patient returns today for routine follow-up. During treatment she did very well with radiotherapy and did not have significant desquamation.                             On review of systems, the patient states she is doing very well overall.  She is not having any concerns with her skin at this point in time but did have some peeling of her skin previously.  She is pleased with the overall process.  She denies any concerns with back pain.  No other complaints are verbalized.  ALLERGIES:  has No Known Allergies.  Meds: Current Outpatient Medications  Medication Sig Dispense Refill  .  anastrozole (ARIMIDEX) 1 MG tablet Take 1 tablet (1 mg total) by mouth daily. (Patient taking differently: Take 1 mg by mouth every evening. ) 90 tablet 4  . diphenhydramine-acetaminophen (TYLENOL PM) 25-500 MG TABS tablet Take 1-2 tablets by mouth at bedtime.     Marland Kitchen doxylamine, Sleep, (UNISOM) 25 MG tablet Take 25 mg by mouth at bedtime as needed for sleep.    Marland Kitchen ascorbic acid (VITAMIN C) 1000 MG tablet Take 1,000 mg by mouth daily.    . Calcium Carb-Cholecalciferol (CALCIUM 600 + D PO) Take 1 tablet by mouth daily.    . Calcium-Magnesium-Zinc (CAL-MAG-ZINC PO) Take 1 tablet by mouth daily.    . Cholecalciferol (VITAMIN D3) 3000 units TABS Take 3,000 Units by mouth daily.    . Cyanocobalamin (B-12) 5000 MCG CAPS Take 5,000 mcg by mouth daily.    . Multiple Vitamin (MULTIVITAMIN WITH MINERALS) TABS tablet Take 1 tablet by mouth daily.    . Probiotic CAPS Take 1 capsule by mouth daily.    . Red Yeast Rice Extract (RED YEAST RICE PO) Take 1,200 mg by mouth 3 (three) times a week.    Marland Kitchen VITAMIN E PO Take 180 Units by mouth daily.     No current facility-administered medications for this encounter.     Physical Findings:  height is 5\' 5"  (1.651 m) and weight is 160 lb 6.4 oz (  72.8 kg). Her oral temperature is 97.8 F (36.6 C). Her blood pressure is 141/62 (abnormal) and her pulse is 76. Her respiration is 18 and oxygen saturation is 94%.  Pain Assessment Pain Score: 0-No pain/10 In general this is a well appearing Caucasian female in no acute distress. She's alert and oriented x4 and appropriate throughout the examination. Cardiopulmonary assessment is negative for acute distress and she exhibits normal effort. The left chest wall  was examined and reveals mild hyperpigmentation along the previously radiated sites, her seborrheic keratoses along the chest wall which appeared to be peeling..   Lab Findings: Lab Results  Component Value Date   WBC 5.2 08/31/2018   HGB 13.3 08/31/2018   HCT 41.3  08/31/2018   MCV 95.4 08/31/2018   PLT 139 (L) 08/31/2018     Radiographic Findings: No results found.  Impression/Plan: 1. Stage II-III [id number], grade1, ER/PR positive invasive ductal carcinoma of the left breast. The patient  done justhas been doing well since completion of radiotherapy. We discussed that we would be happy to continue to follow her as needed, but she will also continue to follow up with Dr. Jana Hakim in medical oncology. She was counseled on skin care as well as measures to avoid sun exposure to this area.  She was counseled on the rationale to repeat imaging to the spine to determine response following her SRS.  She will be due for this in December.  We will coordinate this by phone, and review the discussion after conference by phone as well.  She is aware that she would need annual spine imaging as well as MRI of the thoracic spine every 3 to 4 months. 2. Survivorship. We discussed the importance of survivorship evaluation and she is was seen for that this afternoon.  We will continue to follow this expectantly and she will be seen in survivorship as needed.    Carola Rhine, PAC

## 2018-09-05 ENCOUNTER — Encounter: Payer: Self-pay | Admitting: Adult Health

## 2018-10-02 ENCOUNTER — Encounter: Payer: Self-pay | Admitting: Oncology

## 2018-10-02 NOTE — Progress Notes (Signed)
Pt had assistance w/ the [facility] and wanted to use her remaining funds for Zometa.  After researching, the [employer] does not cover Zometa under the Breast Cancer fund.  I informed pt of this info.

## 2018-10-03 ENCOUNTER — Inpatient Hospital Stay: Payer: Medicare Other

## 2018-10-03 ENCOUNTER — Inpatient Hospital Stay: Payer: Medicare Other | Attending: Oncology

## 2018-10-03 VITALS — BP 141/70 | HR 71 | Temp 98.2°F | Resp 16

## 2018-10-03 DIAGNOSIS — Z170 Estrogen receptor positive status [ER+]: Principal | ICD-10-CM

## 2018-10-03 DIAGNOSIS — C50812 Malignant neoplasm of overlapping sites of left female breast: Secondary | ICD-10-CM | POA: Diagnosis present

## 2018-10-03 DIAGNOSIS — Z79811 Long term (current) use of aromatase inhibitors: Secondary | ICD-10-CM | POA: Diagnosis not present

## 2018-10-03 DIAGNOSIS — C50912 Malignant neoplasm of unspecified site of left female breast: Secondary | ICD-10-CM

## 2018-10-03 LAB — COMPREHENSIVE METABOLIC PANEL
ALT: 7 U/L (ref 0–44)
AST: 19 U/L (ref 15–41)
Albumin: 4.2 g/dL (ref 3.5–5.0)
Alkaline Phosphatase: 50 U/L (ref 38–126)
Anion gap: 11 (ref 5–15)
BUN: 16 mg/dL (ref 8–23)
CHLORIDE: 106 mmol/L (ref 98–111)
CO2: 26 mmol/L (ref 22–32)
CREATININE: 0.94 mg/dL (ref 0.44–1.00)
Calcium: 9.6 mg/dL (ref 8.9–10.3)
GFR calc Af Amer: >60 mL/min (ref >60)
GFR calc non Af Amer: 55 mL/min — ABNORMAL LOW (ref >60)
Glucose, Bld: 100 mg/dL — ABNORMAL HIGH (ref 70–99)
POTASSIUM: 4.9 mmol/L (ref 3.5–5.1)
SODIUM: 143 mmol/L (ref 135–145)
Total Bilirubin: 0.8 mg/dL (ref 0.3–1.2)
Total Protein: 7.1 g/dL (ref 6.5–8.1)

## 2018-10-03 LAB — CBC WITH DIFFERENTIAL/PLATELET
ABS IMMATURE GRANULOCYTES: 0 10*3/uL (ref 0.00–0.07)
BASOS ABS: 0 10*3/uL (ref 0.0–0.1)
Basophils Relative: 1 %
EOS PCT: 4 %
Eosinophils Absolute: 0.2 10*3/uL (ref 0.0–0.5)
HEMATOCRIT: 41.7 % (ref 36.0–46.0)
HEMOGLOBIN: 13.5 g/dL (ref 12.0–15.0)
Immature Granulocytes: 0 %
LYMPHS ABS: 1.3 10*3/uL (ref 0.7–4.0)
LYMPHS PCT: 27 %
MCH: 30.1 pg (ref 26.0–34.0)
MCHC: 32.4 g/dL (ref 30.0–36.0)
MCV: 93.1 fL (ref 80.0–100.0)
MONO ABS: 0.6 10*3/uL (ref 0.1–1.0)
Monocytes Relative: 12 %
NEUTROS ABS: 2.7 10*3/uL (ref 1.7–7.7)
NRBC: 0 % (ref 0.0–0.2)
Neutrophils Relative %: 56 %
Platelets: 142 10*3/uL — ABNORMAL LOW (ref 150–400)
RBC: 4.48 MIL/uL (ref 3.87–5.11)
RDW: 12.1 % (ref 11.5–15.5)
WBC: 4.7 10*3/uL (ref 4.0–10.5)

## 2018-10-03 MED ORDER — SODIUM CHLORIDE 0.9 % IV SOLN
Freq: Once | INTRAVENOUS | Status: AC
  Administered 2018-10-03: 09:00:00 via INTRAVENOUS
  Filled 2018-10-03: qty 250

## 2018-10-03 MED ORDER — ZOLEDRONIC ACID 4 MG/100ML IV SOLN: 3.3000 mg | Freq: Once | INTRAVENOUS | Status: DC

## 2018-10-03 MED ORDER — ZOLEDRONIC ACID 4 MG/5ML IV CONC
3.3000 mg | Freq: Once | INTRAVENOUS | Status: AC
  Administered 2018-10-03: 3.3 mg via INTRAVENOUS
  Filled 2018-10-03: qty 4.13

## 2018-10-03 MED ORDER — ZOLEDRONIC ACID 4 MG/100ML IV SOLN
4.0000 mg | Freq: Once | INTRAVENOUS | Status: DC
  Filled 2018-10-03: qty 100

## 2018-10-03 NOTE — Patient Instructions (Signed)

## 2018-10-23 ENCOUNTER — Other Ambulatory Visit: Payer: Self-pay | Admitting: Radiation Therapy

## 2018-10-23 DIAGNOSIS — C7951 Secondary malignant neoplasm of bone: Secondary | ICD-10-CM

## 2018-10-29 ENCOUNTER — Other Ambulatory Visit: Payer: Self-pay | Admitting: Radiation Therapy

## 2018-11-02 ENCOUNTER — Ambulatory Visit (HOSPITAL_COMMUNITY)
Admission: RE | Admit: 2018-11-02 | Discharge: 2018-11-02 | Disposition: A | Discharge status: Home / Self Care | Payer: Medicare Other | Source: Ambulatory Visit | Attending: Radiation Oncology | Admitting: Radiation Oncology

## 2018-11-02 DIAGNOSIS — C7951 Secondary malignant neoplasm of bone: Secondary | ICD-10-CM | POA: Insufficient documentation

## 2018-11-02 MED ORDER — GADOBUTROL 1 MMOL/ML IV SOLN
7.5000 mL | Freq: Once | INTRAVENOUS | Status: AC | PRN
  Administered 2018-11-02: 7 mL via INTRAVENOUS

## 2018-11-05 ENCOUNTER — Other Ambulatory Visit: Payer: Medicare Other

## 2018-11-06 NOTE — Progress Notes (Signed)
Brain and Spine Tumor Board Documentation  Jessica Glenn was presented by Cecil Cobbs, MD at Brain and Spine Tumor Board on 11/06/2018, which included representatives from neuro oncology, radiation oncology, surgical oncology, radiology, pathology, navigation.  Jessica Glenn was presented as a current patient with history of the following treatments:  .  Additionally, we reviewed previous medical and familial history, history of present illness, and recent lab results along with all available histopathologic and imaging studies. The tumor board considered available treatment options and made the following recommendations:  Active surveillance    Tumor board is a meeting of clinicians from various specialty areas who evaluate and discuss patients for whom a multidisciplinary approach is being considered. Final determinations in the plan of care are those of the provider(s). The responsibility for follow up of recommendations given during tumor board is that of the provider.   Today's extended care, comprehensive team conference, Jessica Glenn was not present for the discussion and was not examined.

## 2018-11-07 ENCOUNTER — Ambulatory Visit: Admission: RE | Admit: 2018-11-07 | Payer: Medicare Other | Source: Ambulatory Visit | Admitting: Radiation Oncology

## 2018-11-07 ENCOUNTER — Telehealth: Payer: Self-pay | Admitting: Radiation Oncology

## 2018-11-07 NOTE — Telephone Encounter (Signed)
I called the patient to review her scan results from her recent MRI. She is doing well and pleased to hear that the scan looked great. We will plan repeat MRI in 3 months and review again in brain/spine oncology conference. She will contact us if her clinical situation changes.

## 2018-12-10 ENCOUNTER — Other Ambulatory Visit: Payer: Self-pay | Admitting: *Deleted

## 2018-12-10 DIAGNOSIS — C7951 Secondary malignant neoplasm of bone: Secondary | ICD-10-CM

## 2018-12-26 ENCOUNTER — Inpatient Hospital Stay: Payer: Medicare Other | Attending: Oncology

## 2018-12-26 ENCOUNTER — Inpatient Hospital Stay: Payer: Medicare Other

## 2018-12-26 VITALS — BP 149/72 | HR 67 | Temp 98.9°F | Resp 14

## 2018-12-26 DIAGNOSIS — C7951 Secondary malignant neoplasm of bone: Secondary | ICD-10-CM | POA: Insufficient documentation

## 2018-12-26 DIAGNOSIS — C50912 Malignant neoplasm of unspecified site of left female breast: Secondary | ICD-10-CM

## 2018-12-26 DIAGNOSIS — C50812 Malignant neoplasm of overlapping sites of left female breast: Secondary | ICD-10-CM

## 2018-12-26 DIAGNOSIS — Z170 Estrogen receptor positive status [ER+]: Principal | ICD-10-CM

## 2018-12-26 LAB — CBC WITH DIFFERENTIAL/PLATELET
Abs Immature Granulocytes: 0.01 10*3/uL (ref 0.00–0.07)
Basophils Absolute: 0 10*3/uL (ref 0.0–0.1)
Basophils Relative: 1 %
Eosinophils Absolute: 0.2 10*3/uL (ref 0.0–0.5)
Eosinophils Relative: 4 %
HCT: 41.2 % (ref 36.0–46.0)
Hemoglobin: 13.3 g/dL (ref 12.0–15.0)
Immature Granulocytes: 0 %
Lymphocytes Relative: 22 %
Lymphs Abs: 1 10*3/uL (ref 0.7–4.0)
MCH: 29.4 pg (ref 26.0–34.0)
MCHC: 32.3 g/dL (ref 30.0–36.0)
MCV: 91.2 fL (ref 80.0–100.0)
Monocytes Absolute: 0.5 10*3/uL (ref 0.1–1.0)
Monocytes Relative: 11 %
NEUTROS PCT: 62 %
Neutro Abs: 2.7 10*3/uL (ref 1.7–7.7)
Platelets: 129 10*3/uL — ABNORMAL LOW (ref 150–400)
RBC: 4.52 MIL/uL (ref 3.87–5.11)
RDW: 13 % (ref 11.5–15.5)
WBC: 4.3 10*3/uL (ref 4.0–10.5)
nRBC: 0 % (ref 0.0–0.2)

## 2018-12-26 LAB — COMPREHENSIVE METABOLIC PANEL
ALT: 8 U/L (ref 0–44)
AST: 18 U/L (ref 15–41)
Albumin: 4.1 g/dL (ref 3.5–5.0)
Alkaline Phosphatase: 57 U/L (ref 38–126)
Anion gap: 10 (ref 5–15)
BILIRUBIN TOTAL: 0.6 mg/dL (ref 0.3–1.2)
BUN: 18 mg/dL (ref 8–23)
CO2: 24 mmol/L (ref 22–32)
Calcium: 8.9 mg/dL (ref 8.9–10.3)
Chloride: 107 mmol/L (ref 98–111)
Creatinine, Ser: 0.93 mg/dL (ref 0.44–1.00)
GFR calc Af Amer: >60 mL/min (ref >60)
GFR, EST NON AFRICAN AMERICAN: 55 mL/min — AB (ref >60)
Glucose, Bld: 100 mg/dL — ABNORMAL HIGH (ref 70–99)
Potassium: 4.1 mmol/L (ref 3.5–5.1)
Sodium: 141 mmol/L (ref 135–145)
Total Protein: 6.9 g/dL (ref 6.5–8.1)

## 2018-12-26 MED ORDER — ZOLEDRONIC ACID 4 MG/5ML IV CONC
3.3000 mg | Freq: Once | INTRAVENOUS | Status: AC
  Administered 2018-12-26: 3.3 mg via INTRAVENOUS
  Filled 2018-12-26: qty 4.13

## 2018-12-26 MED ORDER — SODIUM CHLORIDE 0.9 % IV SOLN
INTRAVENOUS | Status: DC
  Administered 2018-12-26: 10:00:00 via INTRAVENOUS
  Filled 2018-12-26: qty 250

## 2018-12-26 NOTE — Patient Instructions (Signed)

## 2019-01-17 ENCOUNTER — Other Ambulatory Visit: Payer: Medicare Other

## 2019-01-17 ENCOUNTER — Ambulatory Visit: Payer: Medicare Other

## 2019-01-17 ENCOUNTER — Ambulatory Visit: Payer: Medicare Other | Admitting: Oncology

## 2019-01-28 ENCOUNTER — Other Ambulatory Visit: Payer: Self-pay | Admitting: Radiation Therapy

## 2019-01-29 ENCOUNTER — Telehealth: Payer: Self-pay | Admitting: Radiation Therapy

## 2019-01-29 NOTE — Telephone Encounter (Signed)
Jessica Glenn requested that her April Follow-up spine MRI and visit be delayed due to the coronavirus pandemic. Per her request, the scan and visit have been reschedule to the end of May. If she is still asymptomatic and would like to delay further, she will contact us closer to the scheduled visits.  Mont Dutton R.T.(R)(T) Special Procedures Navigator  [phone]

## 2019-02-01 ENCOUNTER — Ambulatory Visit (HOSPITAL_COMMUNITY): Admission: RE | Admit: 2019-02-01 | Payer: Medicare Other | Source: Ambulatory Visit

## 2019-02-05 ENCOUNTER — Ambulatory Visit: Payer: Self-pay | Admitting: Urology

## 2019-02-23 ENCOUNTER — Other Ambulatory Visit: Payer: Self-pay | Admitting: Oncology

## 2019-03-13 ENCOUNTER — Ambulatory Visit (HOSPITAL_COMMUNITY)
Admission: RE | Admit: 2019-03-13 | Discharge: 2019-03-13 | Disposition: A | Discharge status: Home / Self Care | Payer: Medicare Other | Source: Ambulatory Visit | Attending: Adult Health | Admitting: Adult Health

## 2019-03-13 ENCOUNTER — Encounter (HOSPITAL_COMMUNITY)
Admission: RE | Admit: 2019-03-13 | Discharge: 2019-03-13 | Disposition: A | Discharge status: Home / Self Care | Payer: Medicare Other | Source: Ambulatory Visit | Attending: Adult Health | Admitting: Adult Health

## 2019-03-13 ENCOUNTER — Other Ambulatory Visit: Payer: Self-pay

## 2019-03-13 DIAGNOSIS — C7951 Secondary malignant neoplasm of bone: Secondary | ICD-10-CM | POA: Insufficient documentation

## 2019-03-13 DIAGNOSIS — Z170 Estrogen receptor positive status [ER+]: Secondary | ICD-10-CM | POA: Insufficient documentation

## 2019-03-13 DIAGNOSIS — C50812 Malignant neoplasm of overlapping sites of left female breast: Secondary | ICD-10-CM | POA: Diagnosis present

## 2019-03-13 MED ORDER — TECHNETIUM TC 99M MEDRONATE IV KIT
20.0000 | PACK | Freq: Once | INTRAVENOUS | Status: AC | PRN
  Administered 2019-03-13: 20.9 via INTRAVENOUS

## 2019-03-15 ENCOUNTER — Encounter (HOSPITAL_COMMUNITY): Payer: Medicare Other

## 2019-03-15 ENCOUNTER — Other Ambulatory Visit (HOSPITAL_COMMUNITY): Payer: Medicare Other

## 2019-03-18 ENCOUNTER — Telehealth: Payer: Self-pay

## 2019-03-18 NOTE — Telephone Encounter (Signed)
-----   Message from Gardenia Phlegm, NP sent at 03/18/2019 10:56 AM EDT ----- Please call patient and tell her that the bone scan shows no sign of cancer ----- Message ----- From: Interface, Rad Results In Sent: 03/14/2019   9:57 AM EDT To: Gardenia Phlegm, NP

## 2019-03-18 NOTE — Telephone Encounter (Signed)
Patient had already been informed of results by [facility] nurse.

## 2019-03-19 ENCOUNTER — Ambulatory Visit: Payer: Medicare Other

## 2019-03-19 ENCOUNTER — Other Ambulatory Visit: Payer: Medicare Other

## 2019-03-19 ENCOUNTER — Ambulatory Visit: Payer: Medicare Other | Admitting: Oncology

## 2019-03-22 ENCOUNTER — Ambulatory Visit (HOSPITAL_COMMUNITY): Payer: Medicare Other

## 2019-03-27 ENCOUNTER — Ambulatory Visit: Payer: Medicare Other | Admitting: Radiation Oncology

## 2019-04-04 ENCOUNTER — Ambulatory Visit (HOSPITAL_COMMUNITY)
Admission: RE | Admit: 2019-04-04 | Discharge: 2019-04-04 | Disposition: A | Discharge status: Home / Self Care | Payer: Medicare Other | Source: Ambulatory Visit | Attending: Radiation Oncology | Admitting: Radiation Oncology

## 2019-04-04 ENCOUNTER — Other Ambulatory Visit: Payer: Self-pay

## 2019-04-04 DIAGNOSIS — C7951 Secondary malignant neoplasm of bone: Secondary | ICD-10-CM | POA: Insufficient documentation

## 2019-04-04 LAB — POCT I-STAT CREATININE: Creatinine, Ser: 1 mg/dL (ref 0.44–1.00)

## 2019-04-04 MED ORDER — GADOBUTROL 1 MMOL/ML IV SOLN
8.0000 mL | Freq: Once | INTRAVENOUS | Status: AC | PRN
  Administered 2019-04-04: 13:00:00 8 mL via INTRAVENOUS

## 2019-04-08 ENCOUNTER — Ambulatory Visit
Admission: RE | Admit: 2019-04-08 | Discharge: 2019-04-08 | Disposition: A | Discharge status: Home / Self Care | Payer: Medicare Other | Source: Ambulatory Visit | Attending: Radiation Oncology | Admitting: Radiation Oncology

## 2019-04-08 ENCOUNTER — Inpatient Hospital Stay: Payer: Medicare Other | Attending: Radiation Oncology

## 2019-04-08 DIAGNOSIS — C7951 Secondary malignant neoplasm of bone: Secondary | ICD-10-CM

## 2019-04-08 DIAGNOSIS — C50412 Malignant neoplasm of upper-outer quadrant of left female breast: Secondary | ICD-10-CM

## 2019-04-09 NOTE — Progress Notes (Addendum)
Radiation Oncology         [phone] ________________________________  Outpatient Follow Up - Conducted via telephone due to current COVID-19 concerns for limiting patient exposure  I spoke with the patient to conduct this visit via telephone to spare the patient unnecessary potential exposure in the healthcare setting during the current COVID-19 pandemic. The patient was notified in advance and was offered a [place] meeting to allow for face to face communication but unfortunately reported that they did not have the appropriate resources/technology to support such a visit and instead preferred to proceed with a telephone visit.  ________________________________  Name: Jessica Glenn MRN: [id number]  Date of Service: 04/08/2019  DOB: 12-15-1928  Follow Up Note  CC: Maury Dus, MD  Magrinat, Virgie Dad, MD  Diagnosis: Progressive metastatic stage II-III, T3 N0 M1 grade 1 ER/PR Positive invasive ductal carcinoma of the left breast with a T2 lesion  Interval Since Last Radiation: 9 months  07/05/2018 Stereotactic Radiosurgery: T2 Spine // 18 Gy in 1 fraction  05/01/2018 - 06/15/2018: The patient initially received a dose of 50.4 Gy in 28 fractions to the left chest wall and supraclavicular region. This was delivered using a 3-D conformal, 4 field technique. The patient then received a boost to the mastectomy scar. This delivered an additional 10 Gy in 5 fractions using an en face electron field. The total dose was 60.4 Gy.  Narrative: In summary the patient was diagnosed with stage II or stage III breast disease initially, and on staging imaging was found to have a lytic appearing lesion at T2.  She went on to proceed with mastectomy of the left breast followed by postmastectomy radiation to the left chest wall and regional nodes.  She then went on to receive stereotactic radiosurgery to the T-spine and one fraction.  She is followed in surveillance and an MRI on   04/04/2019 of the T spine  revealed stability in the T2 location without evidence of progressive or new disease findings. She continues with Dr. Jana Hakim with arimidex. She is contacted by phone to review her MRI.              On review of systems, the patient reports that she is doing well overall. She denies any new back pain or symptoms of neurologic compromise. She denies any chest pain, shortness of breath, cough, fevers, chills, night sweats, unintended weight changes. She denies any bowel or bladder disturbances, and denies abdominal pain, nausea or vomiting. She denies any new musculoskeletal or joint aches or pains, new skin lesions or concerns. A complete review of systems is obtained and is otherwise negative.  Past Medical History:  Past Medical History:  Diagnosis Date   Basal cell carcinoma    s/p Moh's surgery  to left side of nose   Breast cancer, left breast ([place]) dx'd 05/2017   History of tobacco use    Varicose vein of leg    BLE    Past Surgical History: Past Surgical History:  Procedure Laterality Date   BREAST BIOPSY Left 05/2017   CATARACT EXTRACTION W/ INTRAOCULAR LENS  IMPLANT, BILATERAL Bilateral    DILATION AND CURETTAGE OF UTERUS     FRACTURE SURGERY     HARDWARE REMOVAL Right 02/14/2017   Procedure: RIGHT ANKLE HARDWARE REMOVAL;  Surgeon: [name], MD;  Location: [facility];  Service: Orthopedics;  Laterality: Right;   I&D EXTREMITY Right 02/14/2017   Procedure: RIGHT IRRIGATION AND DEBRIDEMENT ANKLE;  Surgeon: Christia Reading  Maryla Morrow, MD;  Location: [facility];  Service: Orthopedics;  Laterality: Right;   MASTECTOMY Left 02/14/2018   LEFT MASTECTOMY WITH DEEP LEFT AXILLARY SENTINEL LYMPH NODE BIOPSY    MASTECTOMY W/ SENTINEL NODE BIOPSY Left 02/14/2018   Procedure: LEFT MASTECTOMY WITH SENTINEL LYMPH NODE BIOPSY AND POSSIBLE NODE DISSECTION;  Surgeon: Jovita Kussmaul, MD;  Location: [facility];  Service: General;  Laterality: Left;   MOHS SURGERY Left     "side of nose"   ORIF ANKLE FRACTURE Right 02/02/2016   Procedure: OPEN REDUCTION INTERNAL FIXATION (ORIF) ANKLE FRACTURE;  Surgeon: [name], MD;  Location: [facility];  Service: Orthopedics;  Laterality: Right;  strykermini c armreg bedbone foam    Social History:  Social History   Socioeconomic History   Marital status: Married    Spouse name: Not on file   Number of children: Not on file   Years of education: Not on file   Highest education level: Not on file  Occupational History   Not on file  Social Needs   Financial resource strain: Not on file   Food insecurity:    Worry: Not on file    Inability: Not on file   Transportation needs:    Medical: Not on file    Non-medical: Not on file  Tobacco Use   Smoking status: Former Smoker    Packs/day: 1.00    Years: 8.00    Pack years: 8.00    Types: Cigarettes    Last attempt to quit: 1960    Years since quitting: 60.4   Smokeless tobacco: Never Used  Substance and Sexual Activity   Alcohol use: Yes    Alcohol/week: 5.0 standard drinks    Types: 5 Glasses of wine per week   Drug use: No   Sexual activity: Not Currently  Lifestyle   Physical activity:    Days per week: Not on file    Minutes per session: Not on file   Stress: Not on file  Relationships   Social connections:    Talks on phone: Not on file    Gets together: Not on file    Attends religious service: Not on file    Active member of club or organization: Not on file    Attends meetings of clubs or organizations: Not on file    Relationship status: Not on file   Intimate partner violence:    Fear of current or ex partner: No    Emotionally abused: No    Physically abused: No    Forced sexual activity: No  Other Topics Concern   Not on file  Social History Narrative   Not on file    Family History: Family History  Problem Relation Age of Onset   COPD Mother    Emphysema Mother       ALLERGIES:  has No Known Allergies.  Meds: Current Outpatient Medications  Medication Sig Dispense Refill   anastrozole (ARIMIDEX) 1 MG tablet TAKE 1 TABLET BY MOUTH DAILY 90 tablet 4   ascorbic acid (VITAMIN C) 1000 MG tablet Take 1,000 mg by mouth daily.     Calcium Carb-Cholecalciferol (CALCIUM 600 + D PO) Take 1 tablet by mouth daily.     Calcium-Magnesium-Zinc (CAL-MAG-ZINC PO) Take 1 tablet by mouth daily.     Cholecalciferol (VITAMIN D3) 3000 units TABS Take 3,000 Units by mouth daily.     Cyanocobalamin (B-12) 5000 MCG CAPS Take 5,000 mcg by mouth daily.  diphenhydramine-acetaminophen (TYLENOL PM) 25-500 MG TABS tablet Take 1-2 tablets by mouth at bedtime.      doxylamine, Sleep, (UNISOM) 25 MG tablet Take 25 mg by mouth at bedtime as needed for sleep.     Multiple Vitamin (MULTIVITAMIN WITH MINERALS) TABS tablet Take 1 tablet by mouth daily.     Probiotic CAPS Take 1 capsule by mouth daily.     Red Yeast Rice Extract (RED YEAST RICE PO) Take 1,200 mg by mouth 3 (three) times a week.     VITAMIN E PO Take 180 Units by mouth daily.     No current facility-administered medications for this encounter.     Physical Findings: Unable to assess due to nature of encounter   Lab Findings: Lab Results  Component Value Date   WBC 4.3 12/26/2018   HGB 13.3 12/26/2018   HCT 41.2 12/26/2018   MCV 91.2 12/26/2018   PLT 129 (L) 12/26/2018     Radiographic Findings: Mr Thoracic Spine W Wo Contrast  Result Date: 04/04/2019 CLINICAL DATA:  Metastatic breast cancer post radiation.  Follow-up. EXAM: MRI THORACIC WITHOUT AND WITH CONTRAST TECHNIQUE: Multiplanar and multiecho pulse sequences of the thoracic spine were obtained without and with intravenous contrast. CONTRAST:  8 cc Gadavist COMPARISON:  MRI 11/02/2018, 06/14/2018, 03/14/2018. FINDINGS: MRI THORACIC SPINE FINDINGS Alignment:  Normal Vertebrae: 12 mm lesion of the posterosuperior corner of the L2 vertebral  body has not changed. No growth or extraosseous extension. Fatty marrow change in the upper thoracic region consistent with previous radiation. Indeterminate small foci in the right pedicle of T7 and the left T8 vertebral body have been stable over time. Discogenic endplate marrow changes at T9-10 show expected evolutionary changes. Schmorl's nodes in the lower thoracic region are noted. No new definite metastasis. Cord:  No cord compression or primary cord lesion. Paraspinal and other soft tissues: Negative Disc levels: Ordinary degenerative changes as outlined above. No compressive canal or foraminal stenosis. IMPRESSION: No change in the T2 posterosuperior corner marrow space lesion measuring 12 mm. No progression or extraosseous extension. Indeterminate foci in the right pedicle of T7 and the left T8 vertebral body are stable. No new lesions. Electronically Signed   By: Nelson Chimes M.D.   On: 04/04/2019 16:15   Nm Bone Scan Whole Body  Result Date: 03/14/2019 CLINICAL DATA:  LEFT breast cancer.  Evaluate for bone metastasis EXAM: NUCLEAR MEDICINE WHOLE BODY BONE SCAN TECHNIQUE: Whole body anterior and posterior images were obtained approximately 3 hours after intravenous injection of radiopharmaceutical. RADIOPHARMACEUTICALS:  20.9 mCi Technetium-31m MDP IV COMPARISON:  Bone scan 07/30/2018, MRI 11/02/2018 FINDINGS: No foci of radiotracer within the axillary appendicular skeleton suggest metastasis. Degenerate change in the lumbar spine. No change from comparison exam. In comparison to MRI 11/02/2018, enhancing lesions were described at T2 and T8. No corresponding lesion identified on current MDP bone scan. IMPRESSION: No scintigraphic evidence of skeletal metastasis. No change from prior bone scan. Electronically Signed   By: Suzy Bouchard M.D.   On: 03/14/2019 09:54    Impression/Plan: 1. Stage II-III [id number], grade1, ER/PR positive invasive ductal carcinoma of the left breast. The patient  continues to do will and radiographically is wihtout disease. she will return for repeat scan in the fall. We discussed that given the coronavirus pandemic, it may be reasonable to postpone her scan an additional month or two if there is concerns of further surge. She will discuss her decision further with navigation once we have a better idea  of the situation at that time. We did review though the NCCN guidelines for following patients, and would prefer a scan closer ot the 3 month mark. She will continue her Arimidex and follow up with Dr. Jana Hakim as scheduled.    Given current concerns for patient exposure during the COVID-19 pandemic, this encounter was conducted via telephone.  The patient has given verbal consent for this type of encounter. The time spent during this encounter was 25 minutes and 50% of that time was spent in the coordination of his care. The attendants for this meeting include Shona Simpson, [facility] and [name]  During the encounter, Shona Simpson [facility] was located at home remotely.   [name]  was located at home.     Carola Rhine, PAC

## 2019-04-10 ENCOUNTER — Other Ambulatory Visit: Payer: Self-pay | Admitting: Oncology

## 2019-04-10 NOTE — Progress Notes (Unsigned)
I called Jessica Glenn and let her know that it is okay to Ms. this months zoledronate dose.  We will give her a dose in September.  She does not need a repeat bone density at this point.  Zoledronate is taking care of her osteopenia/osteoporosis issue

## 2019-05-02 IMAGING — MG MM CLIP PLACEMENT
2 series · 2 of 2 positions shown · non-contrast
Comparison: Previous exam(s).

ADDENDUM:
Pathology revealed

GRADE II INVASIVE DUCTAL CARCINOMA, DUCTAL CARCINOMA IN SITU of the
Left breast, upper outer quadrant. METASTATIC CARCINOMA of the Left
axillary lymph node. This was found to be concordant by Dr. Erxleben
Mandava.
Pathology results were discussed with the patient by telephone. The
patient reported doing well after the biopsies with tenderness at
the sites. Post biopsy instructions and care were reviewed and
questions were answered. The patient was encouraged to call The
Surgical consultation has been arranged with Dr. Agie Chele at
[[email or web address]] on June 26, 2017.
Pathology results reported by Molong Kingdom, RN on 06/23/2017.
CLINICAL DATA: Clip films status post ultrasound-guided biopsy of a
left breast mass and left axillary lymph node.
EXAM:
DIAGNOSTIC LEFT MAMMOGRAM POST ULTRASOUND BIOPSY

[L CC]
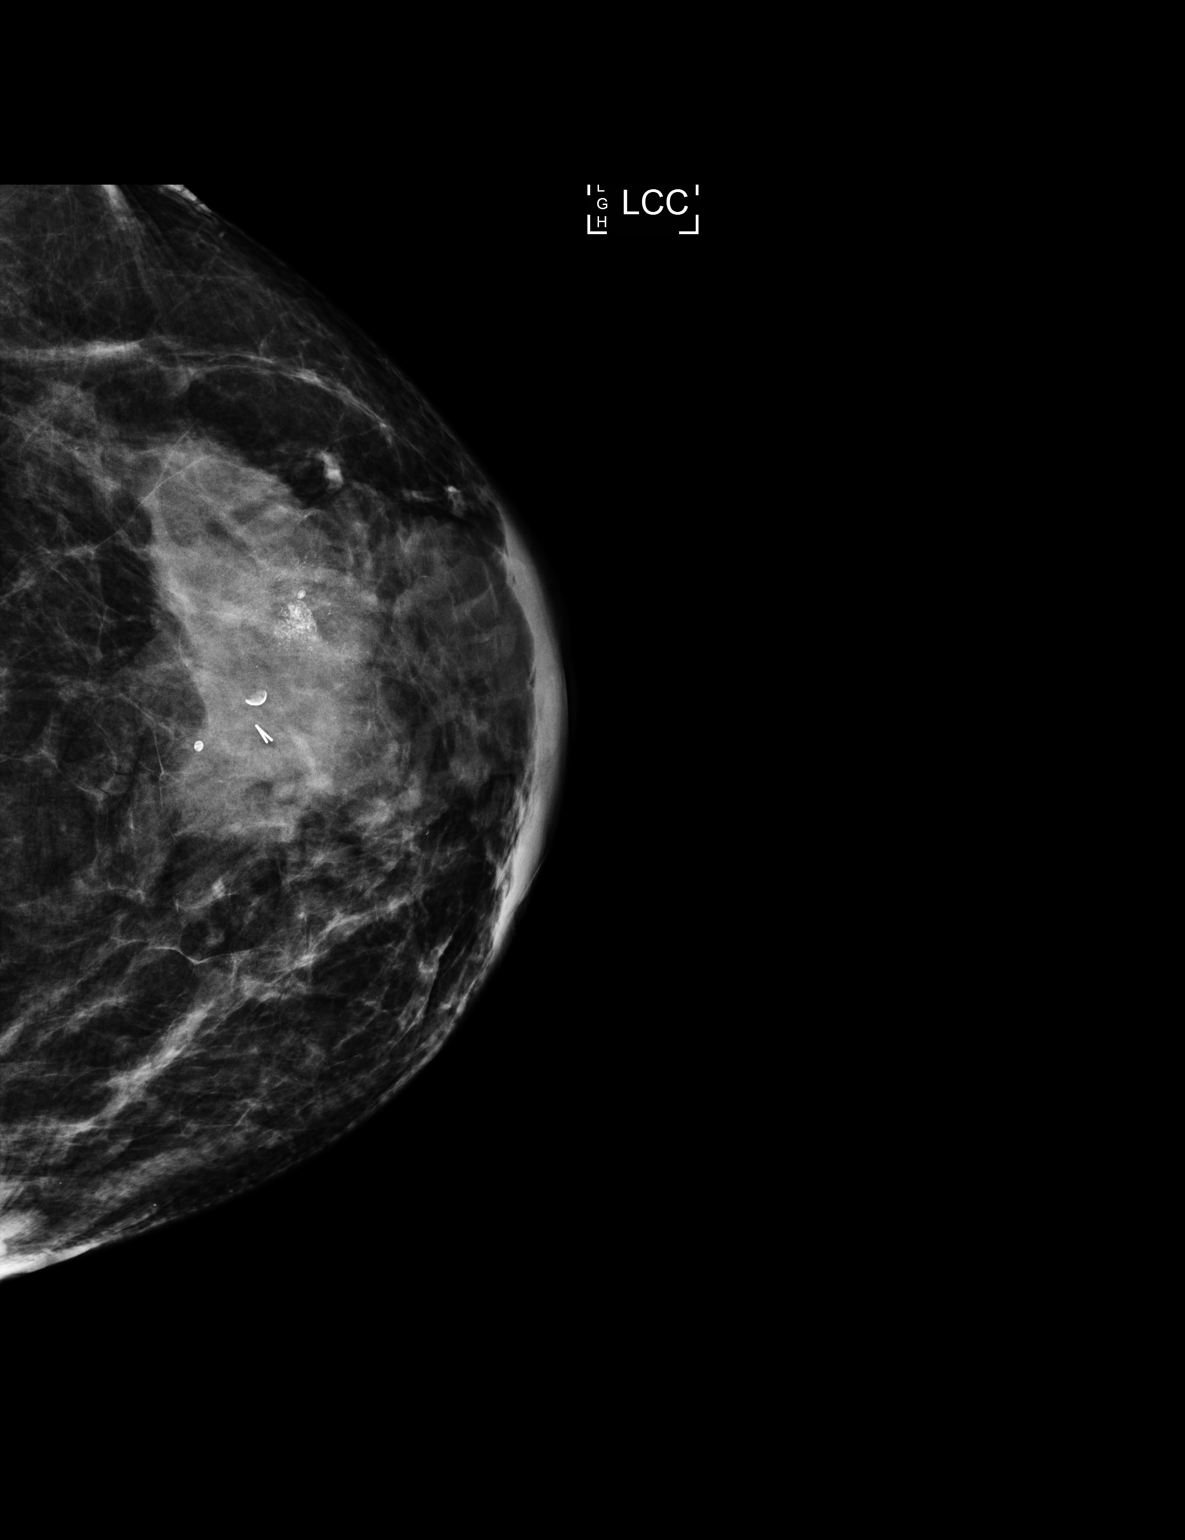

[L ML]
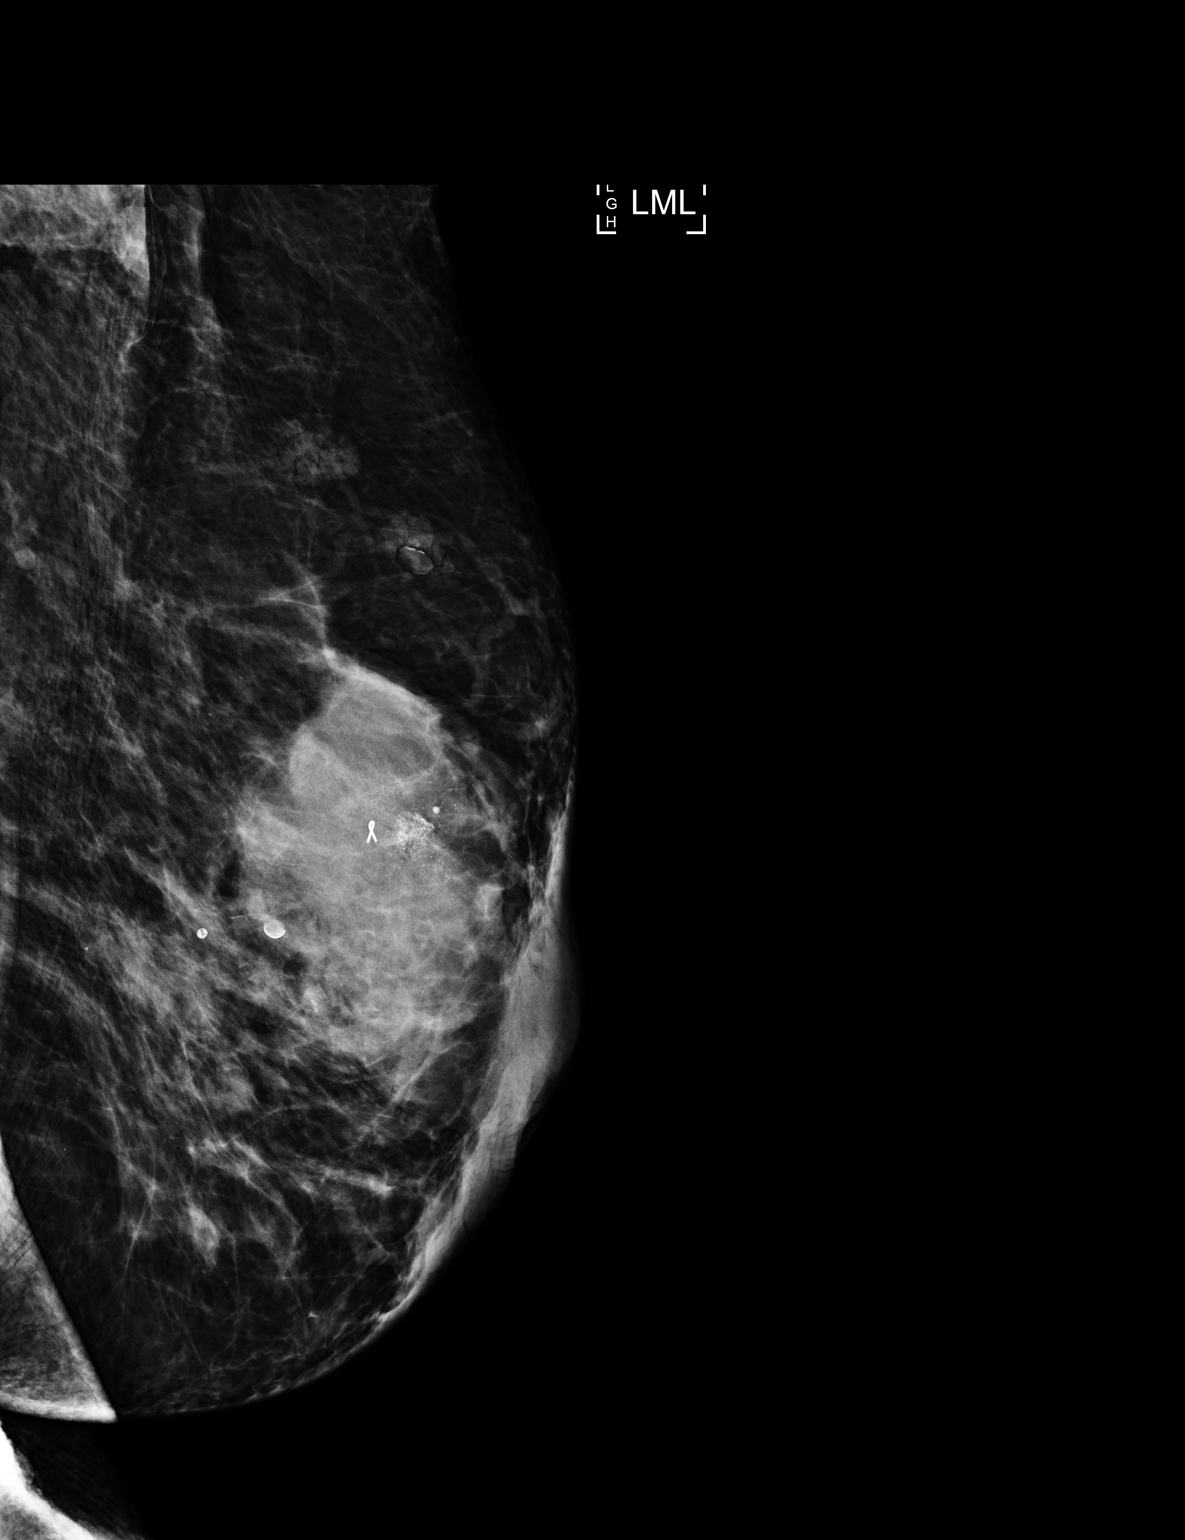

[2 of 2 positions shown; findings below may reference images not displayed]

FINDINGS: Mammographic images were obtained following ultrasound guided biopsy
of a left breast mass and left axillary lymph node. A ribbon shaped
clip is identified with in the left breast mass. A coil shaped clip
is identified within the axillary lymph node.
IMPRESSION: Ribbon shaped left breast post biopsy clip and coil shaped left
axillary post biopsy clip in expected location status post
ultrasound-guided biopsy.

Final Assessment: Post Procedure Mammograms for Marker Placement

## 2019-05-28 ENCOUNTER — Other Ambulatory Visit: Payer: Self-pay | Admitting: Radiation Therapy

## 2019-05-28 DIAGNOSIS — C7951 Secondary malignant neoplasm of bone: Secondary | ICD-10-CM

## 2019-05-29 IMAGING — DX DG THORACIC SPINE 2V
3 series · 3 of 3 positions shown · non-contrast
Comparison: 07/11/17.

CLINICAL DATA: Evaluate lesion seen on bone scan.

EXAM:
THORACIC SPINE 2 VIEWS

[t-spine ap]
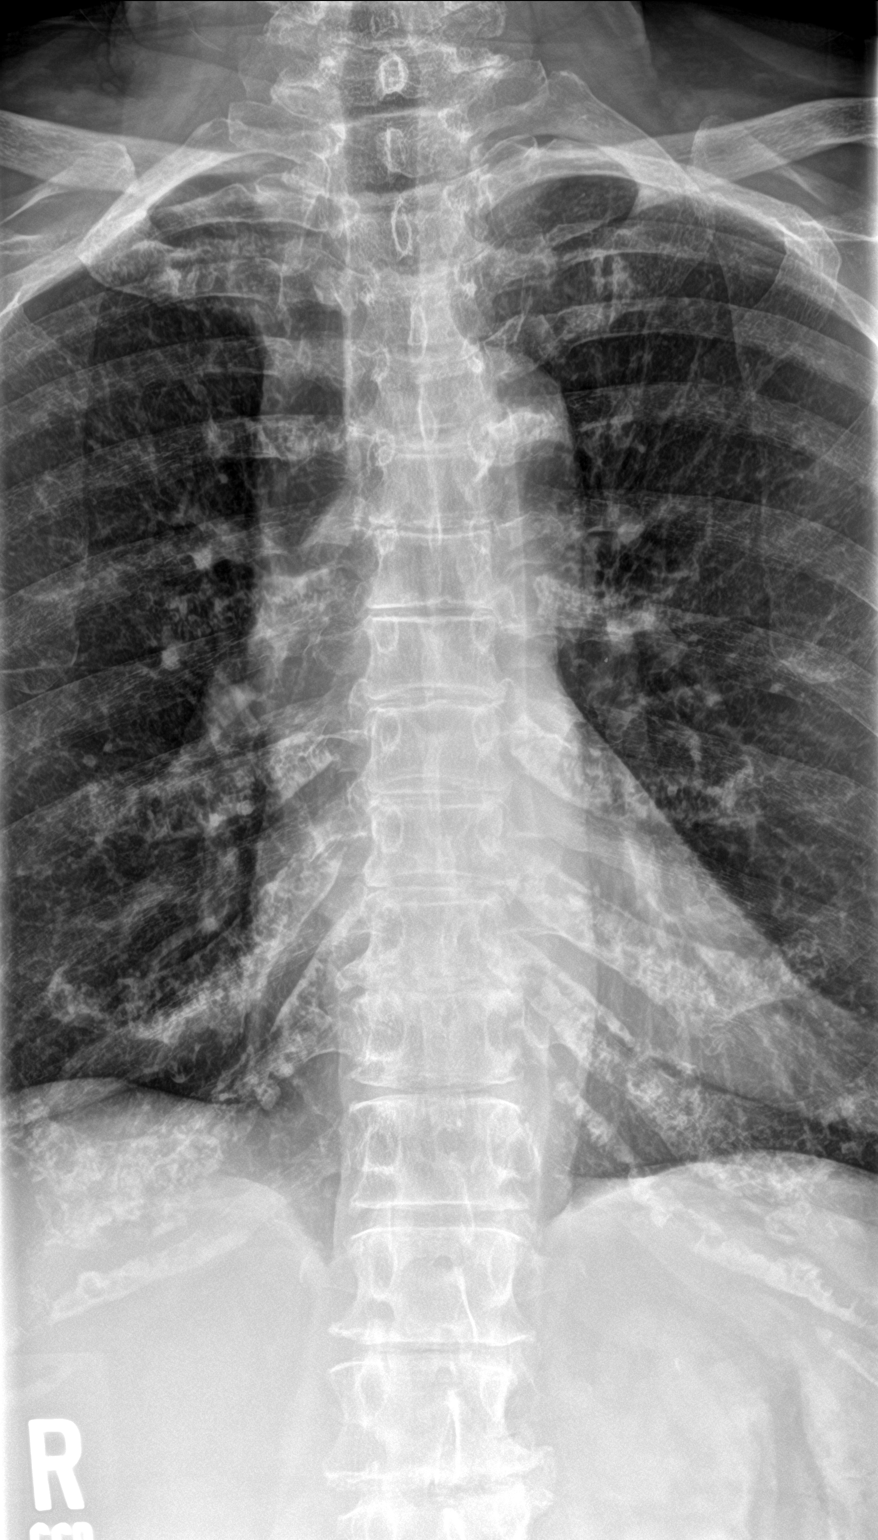

[t-spine lat]
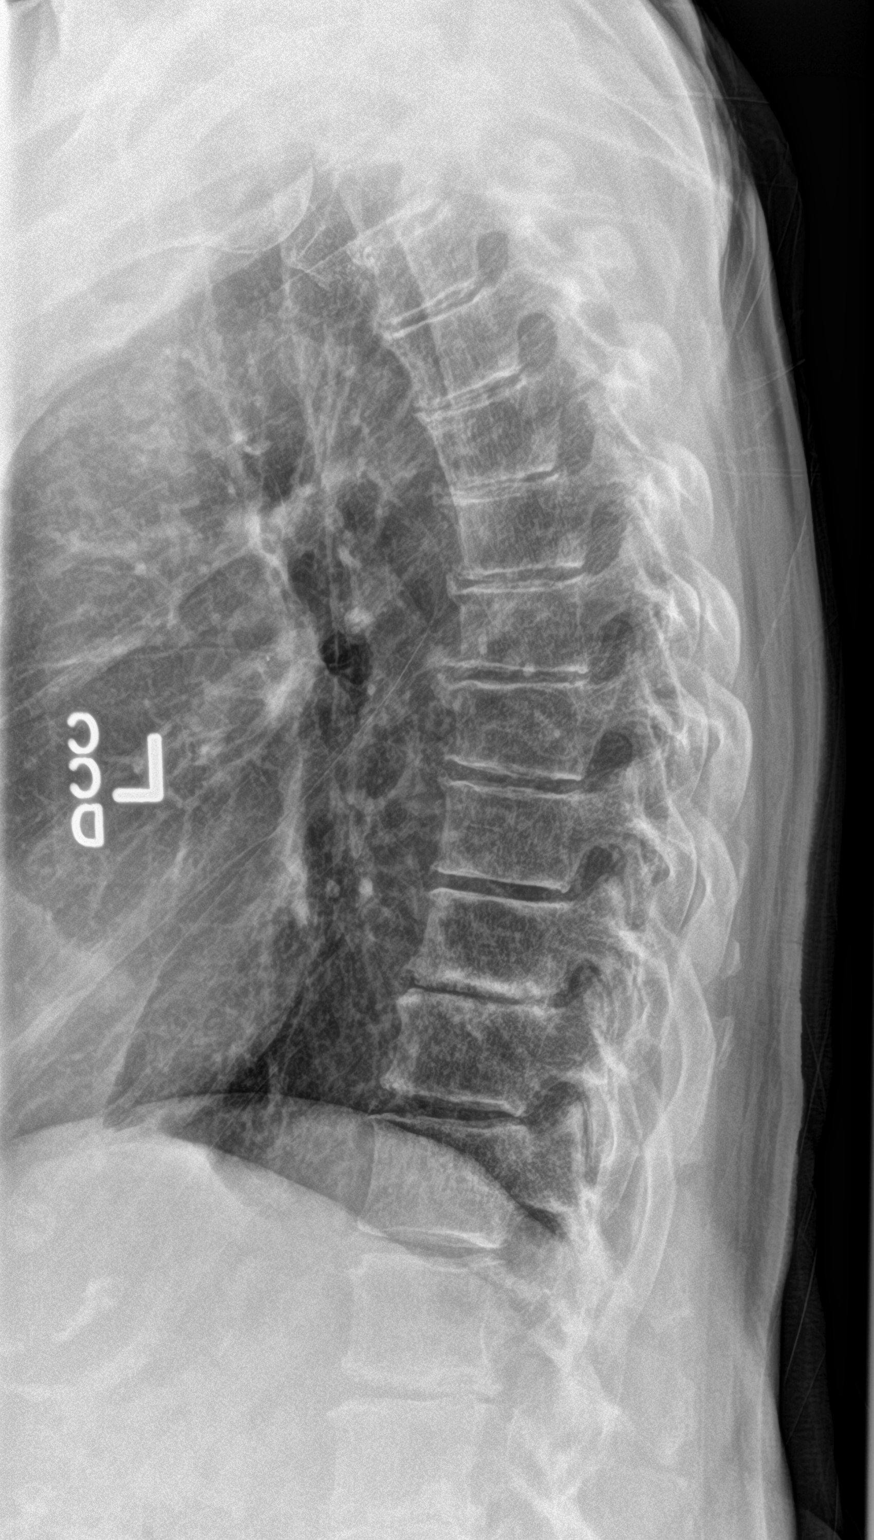

[t-spine swimmers]
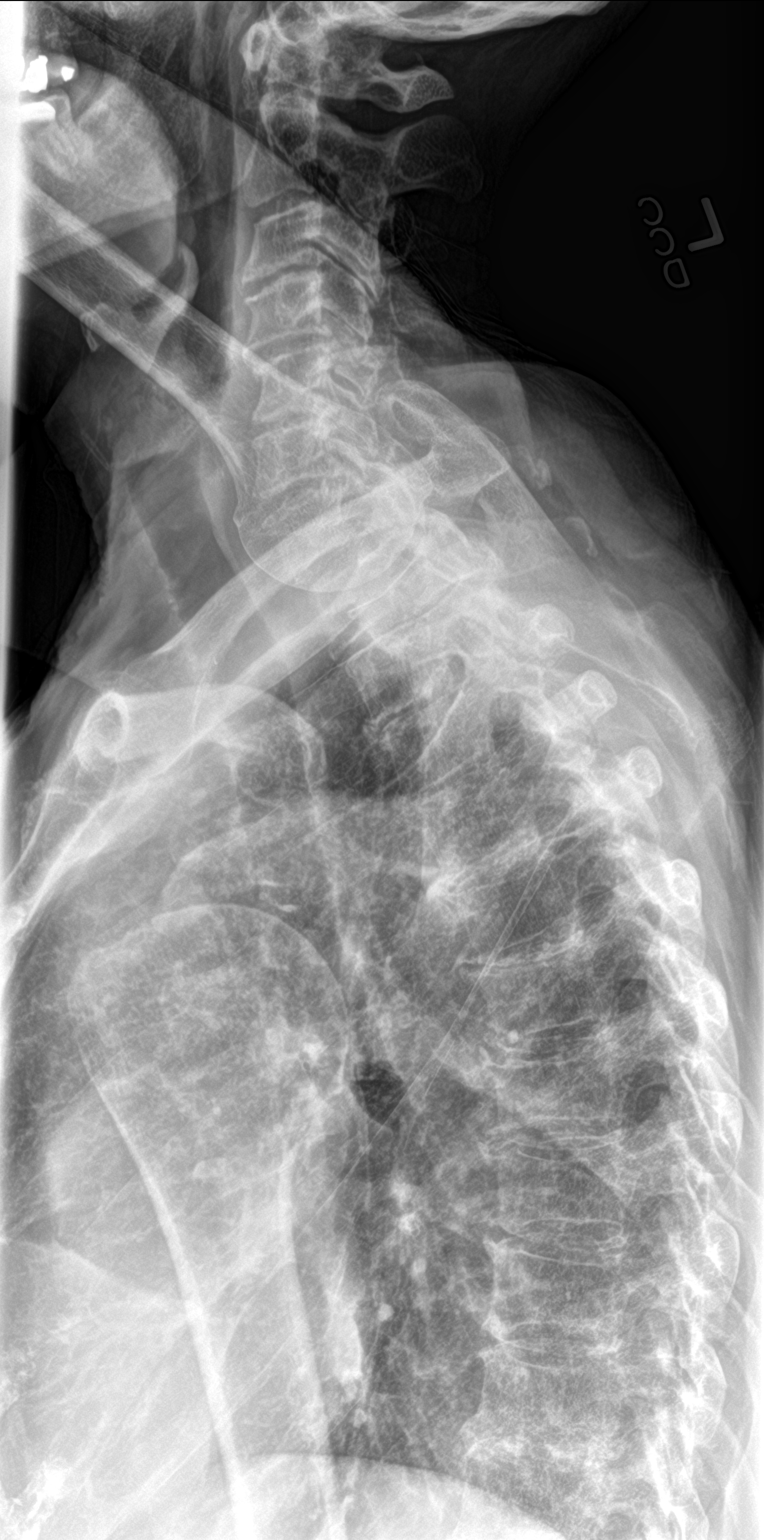

[3 of 3 positions shown; findings below may reference images not displayed]

FINDINGS: Normal alignment of the thoracic spine. No lytic or sclerotic bone
lesions identified. There are degenerative scratch set disc space
narrowing, endplate sclerosis and ventral spurring noted at T10-11
corresponding to the area of uptake on bone scintigraphy.
IMPRESSION: 1. No suspicious bone lesions identified.
2. Area of uptake on recent bone scan within the thoracic spine
corresponds to degenerative disc disease at T10-11.

## 2019-07-03 ENCOUNTER — Other Ambulatory Visit: Payer: Self-pay | Admitting: Family Medicine

## 2019-07-03 DIAGNOSIS — E2839 Other primary ovarian failure: Secondary | ICD-10-CM

## 2019-07-21 NOTE — Progress Notes (Signed)
[place]  [phone] [phone]) [phone]     ID: Jessica Glenn DOB: 1929/06/08  MR#: [id number]  ATF#:[id number]  Patient Care Team: Maury Dus, MD as PCP - General (Family Medicine) Magrinat, Virgie Dad, MD as Consulting Physician (Oncology) Jovita Kussmaul, MD as Consulting Physician (General Surgery) Kyung Rudd, MD as Consulting Physician (Radiation Oncology) OTHER MD:  CHIEF COMPLAINT: Locally advanced/metastatic estrogen receptor positive left breast cancer  CURRENT TREATMENT: Anastrozole; zoledronate   HISTORY OF CURRENT ILLNESS: From the original intake note:  Jessica Glenn tells me she first noted a changes in her left breast about a year and a half ago. She had had some dental work around that time and thought possibly that could be related. She said her breast felt "like steel". She did not bring it to medical attention until her recent appointment with Dr. Alyson Ingles and he set her up for bilateral diagnostic mammography with tomography and left breast ultrasonography at the [facility] 06/19/2017. This found the breast density to be category C. In the upper outer retroareolar left breast there was an irregular mass estimated to be at least 6 cm by mammography. There were heterogeneous calcifications within the mass. There was skin thickening of the areola and an enlarged inferior left axillary lymph node was noted. On exam there was a large fixed hard mass in the upper outer quadrant of the left breast causing protrusion of the areola. It measured approximately 7 cm by palpation and there was visible skin thickening. In the inferior left axilla there was a firm palpable mass measuring 2 cm.  Ultrasound confirmed an irregular hypoechoic vascular mass in the left breast centered on the 1:00 position 4 cm from the nipple measuring 6.7 cm. This was abutting the overlying skin. It was a 2.3 cm hypoechoic vascular mass in the left axilla. The right breast was  unremarkable.  On 06/22/2017 the patient had left breast and left axillary node biopsy, showing ([id number]) both sites to be involved by invasive ductal carcinoma, grade 2, with prognostic panels on both biopsies showing estrogen receptor 100% positivity, progesterone receptor 40-50% positivity, all with strong staining intensity, MIB-1:30 percent in the breast and 15% in the lymph nodes, and both HER-2 negative, with ratios of 0.76-0.85 and number per cell 1.10-1.30.  The patient's subsequent history is as detailed below.   INTERVAL HISTORY: Jessica Glenn returns today for follow-up and treatment of her metastatic breast cancer. She was last seen here on 09/03/2018.   She continues on anastrozole.  She tolerates this remarkably well, with no hot flashes, arthralgias, myalgias, or vaginal dryness problems  There is no bone density screening on file.  However she is receiving zoledronate, which obviates that issue  Since her last visit here, she underwent a thoracic MRI with and without contrast on 11/02/2018 showing: Stable enhancing metastases in the T2 and T8 vertebral bodies. Faintly enhancing metastasis in the right T7 pedicle appears slightly decreased in size. No new osseous lesion.  She also underwent a whole body bone scan on 03/13/2019 showing: No scintigraphic evidence of skeletal metastasis. No change from prior bone scan.  Finally, she underwent a repeat thoracic MRI with and without contrast on 04/04/2019 showing: No change in the T2 posterosuperior corner marrow space lesion measuring 12 mm. No progression or extraosseous extension. Indeterminate foci in the right pedicle of T7 and the left T8 vertebral body are stable. No new lesions.   REVIEW OF SYSTEMS: Jessica Glenn is tolerating the anastrozole and Zometa  at without any obvious complications.  She keeps an excellent functional status, walking up to 3 miles many days.  She has stopped several of her medications, which were mostly supportive,  and she may resume them at her discretion (vitamin C, magnesium, and so on).  She and her husband are keeping appropriate pandemic precautions.  A detailed review of systems today was otherwise stable   PAST MEDICAL HISTORY: Past Medical History:  Diagnosis Date   Basal cell carcinoma    s/p Moh's surgery  to left side of nose   Breast cancer, left breast ([facility]) dx'd 05/2017   History of tobacco use    Varicose vein of leg    BLE    PAST SURGICAL HISTORY: Past Surgical History:  Procedure Laterality Date   BREAST BIOPSY Left 05/2017   CATARACT EXTRACTION W/ INTRAOCULAR LENS  IMPLANT, BILATERAL Bilateral    DILATION AND CURETTAGE OF UTERUS     FRACTURE SURGERY     HARDWARE REMOVAL Right 02/14/2017   Procedure: RIGHT ANKLE HARDWARE REMOVAL;  Surgeon: [name], MD;  Location: [facility];  Service: Orthopedics;  Laterality: Right;   I&D EXTREMITY Right 02/14/2017   Procedure: RIGHT IRRIGATION AND DEBRIDEMENT ANKLE;  Surgeon: [name], MD;  Location: [facility];  Service: Orthopedics;  Laterality: Right;   MASTECTOMY Left 02/14/2018   LEFT MASTECTOMY WITH DEEP LEFT AXILLARY SENTINEL LYMPH NODE BIOPSY    MASTECTOMY W/ SENTINEL NODE BIOPSY Left 02/14/2018   Procedure: LEFT MASTECTOMY WITH SENTINEL LYMPH NODE BIOPSY AND POSSIBLE NODE DISSECTION;  Surgeon: Jovita Kussmaul, MD;  Location: [facility];  Service: General;  Laterality: Left;   MOHS SURGERY Left    "side of nose"   ORIF ANKLE FRACTURE Right 02/02/2016   Procedure: OPEN REDUCTION INTERNAL FIXATION (ORIF) ANKLE FRACTURE;  Surgeon: [name], MD;  Location: [facility];  Service: Orthopedics;  Laterality: Right;  strykermini c armreg bedbone foam    FAMILY HISTORY Family History  Problem Relation Age of Onset   COPD Mother    Emphysema Mother   The patient's father had a history of alcoholism and a band in the family. The patient has no information on the  paternal side. The patient's mother died from emphysema at the age of 58. The patient has one sister, 2 brothers. There is no history of breast or ovarian cancer in the family to her knowledge.  GYNECOLOGIC HISTORY:  No LMP recorded. Patient is postmenopausal. Menarche age 64, first live birth age 40, the patient is [place] P4. She went through the change of life in her 28s. She took hormone replacement for a few years.   SOCIAL HISTORY:  The patient is originally from [place]. Her husband Laurey Arrow is a traveling Theme park manager, nondenominational. Son Laurey Arrow lives in [place] where he works as a Theme park manager; son Ronalee Belts lives in [place] where he works as an Optometrist; son Annie Main lives in [place] where he works as a Chief Strategy Officer; son Rodman Key also lives in [place] and works as a Chief Strategy Officer. The patient has 14 grandchildren and 21 great-grandchildren    ADVANCED DIRECTIVES: In place   HEALTH MAINTENANCE: Social History   Tobacco Use   Smoking status: Former Smoker    Packs/day: 1.00    Years: 8.00    Pack years: 8.00    Types: Cigarettes    Quit date: 1960    Years since quitting: 60.7   Smokeless tobacco: Never Used  Substance Use Topics  Alcohol use: Yes    Alcohol/week: 5.0 standard drinks    Types: 5 Glasses of wine per week   Drug use: No     Colonoscopy:Never  PAP:  Bone density:Osteopenia   No Known Allergies  Current Outpatient Medications  Medication Sig Dispense Refill   anastrozole (ARIMIDEX) 1 MG tablet Take 1 tablet (1 mg total) by mouth daily. 90 tablet 4   ascorbic acid (VITAMIN C) 1000 MG tablet Take 1,000 mg by mouth daily.     Calcium Carb-Cholecalciferol (CALCIUM 600 + D PO) Take 1 tablet by mouth daily.     Cholecalciferol (VITAMIN D3) 3000 units TABS Take 3,000 Units by mouth daily.     Cyanocobalamin (B-12) 5000 MCG CAPS Take 5,000 mcg by mouth daily.     diphenhydramine-acetaminophen (TYLENOL PM) 25-500 MG TABS tablet Take 1-2 tablets by mouth at bedtime.       doxylamine, Sleep, (UNISOM) 25 MG tablet Take 25 mg by mouth at bedtime as needed for sleep.     Multiple Vitamin (MULTIVITAMIN WITH MINERALS) TABS tablet Take 1 tablet by mouth daily.     No current facility-administered medications for this visit.     OBJECTIVE: Older white woman who appears well  Vitals:   07/22/19 1125  BP: (!) 177/64  Pulse: 64  Resp: 18  Temp: 98.2 F (36.8 C)  SpO2: 98%   Wt Readings from Last 3 Encounters:  07/22/19 150 lb 1.6 oz (68.1 kg)  09/03/18 160 lb 6.4 oz (72.8 kg)  09/03/18 160 lb 6.4 oz (72.8 kg)   Body mass index is 24.98 kg/m.    ECOG FS:1 - Symptomatic but completely ambulatory  Ocular: Sclerae unicteric, pupils round and equal Ear-nose-throat: Wearing a mask Lymphatic: No cervical or supraclavicular adenopathy Lungs no rales or rhonchi Heart regular rate and rhythm Abd soft, nontender, positive bowel sounds MSK no focal spinal tenderness, no joint edema Neuro: non-focal, well-oriented, appropriate affect Breasts: The right breast is unremarkable.  The left breast is status post mastectomy.  There is no evidence of chest wall recurrence.  Both axillae are benign.  LAB RESULTS:  CMP     Component Value Date/Time   NA 140 07/22/2019 1052   NA 140 10/27/2017 0908   K 4.3 07/22/2019 1052   K 4.8 10/27/2017 0908   CL 105 07/22/2019 1052   CO2 26 07/22/2019 1052   CO2 27 10/27/2017 0908   GLUCOSE 95 07/22/2019 1052   GLUCOSE 96 10/27/2017 0908   BUN 19 07/22/2019 1052   BUN 20.5 10/27/2017 0908   CREATININE 0.97 07/22/2019 1052   CREATININE 0.9 10/27/2017 0908   CALCIUM 9.9 07/22/2019 1052   CALCIUM 9.7 10/27/2017 0908   PROT 7.3 07/22/2019 1052   PROT 7.2 10/27/2017 0908   ALBUMIN 4.6 07/22/2019 1052   ALBUMIN 4.2 10/27/2017 0908   AST 19 07/22/2019 1052   AST 19 10/27/2017 0908   ALT 7 07/22/2019 1052   ALT 8 10/27/2017 0908   ALKPHOS 48 07/22/2019 1052   ALKPHOS 58 10/27/2017 0908   BILITOT 0.5 07/22/2019  1052   BILITOT 0.62 10/27/2017 0908   GFRNONAA 52 (L) 07/22/2019 1052   GFRAA >60 07/22/2019 1052    No results found for: TOTALPROTELP, ALBUMINELP, A1GS, A2GS, BETS, BETA2SER, GAMS, MSPIKE, SPEI  No results found for: KPAFRELGTCHN, LAMBDASER, KAPLAMBRATIO  Lab Results  Component Value Date   WBC 5.7 07/22/2019   NEUTROABS 3.5 07/22/2019   HGB 13.9 07/22/2019   HCT 42.4 07/22/2019  MCV 92.6 07/22/2019   PLT 148 (L) 07/22/2019     Lab Results  Component Value Date   CA2729 61.6 (H) 07/05/2018       Urinalysis No results found for: COLORURINE, APPEARANCEUR, LABSPEC, PHURINE, GLUCOSEU, HGBUR, BILIRUBINUR, KETONESUR, PROTEINUR, UROBILINOGEN, NITRITE, LEUKOCYTESUR   STUDIES: No results found.  ELIGIBLE FOR AVAILABLE RESEARCH PROTOCOL: no  ASSESSMENT: 85 y.o. [place] woman status post left breast overlapping sites biopsy 06/22/2017 for a clinical T3 N1-2, stage II-III invasive ductal carcinoma, grade 2, estrogen and progesterone receptor positive, HER-2 nonamplified, with an MIB-1 of 30%  (a) staging CT of the chest and bone survey and bone scan July 11, 2017 showed multiple indeterminant subcentimeter pulmonary nodules which will require follow-up, as well as an indeterminate right hepatic lobe lesion, but no definite evidence of malignancy.  (b) CA-27-29 is informative  (1) neo-adjuvant fulvestrant started on 07/05/2017, discontinued 12/20/2017 with possible progression  (2) status post left mastectomy and sentinel lymph node sampling 02/14/2018 for a T3 N1, stage IIA invasive ductal carcinoma, grade 1, with negative margins.  Repeat prognostic panel again estrogen and progesterone receptor positive, HER-2 negative  (a) 2 of 7 removed lymph nodes were positive  (3) adjuvant radiation to follow surgery  (4) anastrozole started 01/17/2018  (a) start palbociclib once disease progression is documented  (5) METASTATIC DISEASE: April 2019  (a) CT scan of the  chest 02/05/2018 shows a new lesion at T2  (b) total spinal MRI 03/14/2018 confirms lesion at T2 and possible minimal lesion at T8, neither amenable to biopsy; there are no other suspicious lesions  (6)  adjuvant radiation 05/01/2018 -06/15/2018  (a) received capecitabine sensitization given the extent of local disease  (b) Site/dose:   The patient initially received a dose of 50.4 Gy in 28 fractions to the left chest wall and supraclavicular region. This was delivered using a 3-D conformal, 4 field technique. The patient then received a boost to the mastectomy scar. This delivered an additional 10 Gy in 5 fractions using an en face electron field. The total dose was 60.4 Gy  (c) stereotactic radiosurgery to T2 given 07/05/2018  (7) to start zolendronate 07/11/2018, to be repeated every 12 weeks  PLAN: Jessica Glenn is now about a year and a half out from initial diagnosis of metastatic breast cancer.  She is asymptomatic as far as that is concerned, and maintains an excellent functional status.  She is tolerating anastrozole well and she will receive a dose of zoledronate today.  The plan is to continue that every 6 months and consider discontinuing it after 2 years  She is already scheduled for repeat thoracic MRI in November with follow-up with radiation oncology.  She will return to see me in 6 months with Zometa on the same day  She knows to call for any other issue that may develop before the next visit.   Magrinat, Virgie Dad, MD  07/22/19 11:56 AM Medical Oncology and Hematology [facility] [place], [place] [address] Tel. [phone]    Fax. [phone]  I, Jacqualyn Posey am acting as a Education administrator for Chauncey Cruel, MD.   I, Lurline Del MD, have reviewed the above documentation for accuracy and completeness, and I agree with the above.

## 2019-07-22 ENCOUNTER — Inpatient Hospital Stay: Payer: Medicare Other

## 2019-07-22 ENCOUNTER — Inpatient Hospital Stay: Payer: Medicare Other | Attending: Oncology | Admitting: Oncology

## 2019-07-22 ENCOUNTER — Other Ambulatory Visit: Payer: Self-pay

## 2019-07-22 VITALS — BP 177/64 | HR 64 | Temp 98.2°F | Resp 18 | Wt 150.1 lb

## 2019-07-22 DIAGNOSIS — Z87891 Personal history of nicotine dependence: Secondary | ICD-10-CM | POA: Diagnosis not present

## 2019-07-22 DIAGNOSIS — C50812 Malignant neoplasm of overlapping sites of left female breast: Secondary | ICD-10-CM

## 2019-07-22 DIAGNOSIS — F17210 Nicotine dependence, cigarettes, uncomplicated: Secondary | ICD-10-CM | POA: Insufficient documentation

## 2019-07-22 DIAGNOSIS — Z79899 Other long term (current) drug therapy: Secondary | ICD-10-CM | POA: Insufficient documentation

## 2019-07-22 DIAGNOSIS — C50412 Malignant neoplasm of upper-outer quadrant of left female breast: Secondary | ICD-10-CM | POA: Diagnosis not present

## 2019-07-22 DIAGNOSIS — C50912 Malignant neoplasm of unspecified site of left female breast: Secondary | ICD-10-CM

## 2019-07-22 DIAGNOSIS — Z170 Estrogen receptor positive status [ER+]: Secondary | ICD-10-CM

## 2019-07-22 DIAGNOSIS — C7951 Secondary malignant neoplasm of bone: Secondary | ICD-10-CM | POA: Diagnosis present

## 2019-07-22 DIAGNOSIS — Z9012 Acquired absence of left breast and nipple: Secondary | ICD-10-CM | POA: Diagnosis not present

## 2019-07-22 DIAGNOSIS — Z85828 Personal history of other malignant neoplasm of skin: Secondary | ICD-10-CM | POA: Insufficient documentation

## 2019-07-22 DIAGNOSIS — Z79811 Long term (current) use of aromatase inhibitors: Secondary | ICD-10-CM | POA: Insufficient documentation

## 2019-07-22 LAB — COMPREHENSIVE METABOLIC PANEL
ALT: 7 U/L (ref 0–44)
AST: 19 U/L (ref 15–41)
Albumin: 4.6 g/dL (ref 3.5–5.0)
Alkaline Phosphatase: 48 U/L (ref 38–126)
Anion gap: 9 (ref 5–15)
BUN: 19 mg/dL (ref 8–23)
CO2: 26 mmol/L (ref 22–32)
Calcium: 9.9 mg/dL (ref 8.9–10.3)
Chloride: 105 mmol/L (ref 98–111)
Creatinine, Ser: 0.97 mg/dL (ref 0.44–1.00)
GFR calc Af Amer: >60 mL/min (ref >60)
GFR calc non Af Amer: 52 mL/min — ABNORMAL LOW (ref >60)
Glucose, Bld: 95 mg/dL (ref 70–99)
Potassium: 4.3 mmol/L (ref 3.5–5.1)
Sodium: 140 mmol/L (ref 135–145)
Total Bilirubin: 0.5 mg/dL (ref 0.3–1.2)
Total Protein: 7.3 g/dL (ref 6.5–8.1)

## 2019-07-22 LAB — CBC WITH DIFFERENTIAL/PLATELET
Abs Immature Granulocytes: 0.01 10*3/uL (ref 0.00–0.07)
Basophils Absolute: 0 10*3/uL (ref 0.0–0.1)
Basophils Relative: 1 %
Eosinophils Absolute: 0.2 10*3/uL (ref 0.0–0.5)
Eosinophils Relative: 4 %
HCT: 42.4 % (ref 36.0–46.0)
Hemoglobin: 13.9 g/dL (ref 12.0–15.0)
Immature Granulocytes: 0 %
Lymphocytes Relative: 24 %
Lymphs Abs: 1.4 10*3/uL (ref 0.7–4.0)
MCH: 30.3 pg (ref 26.0–34.0)
MCHC: 32.8 g/dL (ref 30.0–36.0)
MCV: 92.6 fL (ref 80.0–100.0)
Monocytes Absolute: 0.5 10*3/uL (ref 0.1–1.0)
Monocytes Relative: 10 %
Neutro Abs: 3.5 10*3/uL (ref 1.7–7.7)
Neutrophils Relative %: 61 %
Platelets: 148 10*3/uL — ABNORMAL LOW (ref 150–400)
RBC: 4.58 MIL/uL (ref 3.87–5.11)
RDW: 12.9 % (ref 11.5–15.5)
WBC: 5.7 10*3/uL (ref 4.0–10.5)
nRBC: 0 % (ref 0.0–0.2)

## 2019-07-22 MED ORDER — ANASTROZOLE 1 MG PO TABS: 1.0000 mg | ORAL_TABLET | Freq: Every day | ORAL | 4 refills | Status: DC

## 2019-07-22 MED ORDER — ZOLEDRONIC ACID 4 MG/5ML IV CONC
3.3000 mg | Freq: Once | INTRAVENOUS | Status: AC
  Administered 2019-07-22: 3.3 mg via INTRAVENOUS
  Filled 2019-07-22: qty 4.13

## 2019-07-22 MED ORDER — ZOLEDRONIC ACID 4 MG/100ML IV SOLN
3.3000 mg | Freq: Once | INTRAVENOUS | Status: DC
  Filled 2019-07-22: qty 83

## 2019-07-22 NOTE — Patient Instructions (Signed)
Zoledronic Acid injection (Hypercalcemia, Oncology) What is this medicine? ZOLEDRONIC ACID (ZOE le dron ik AS id) lowers the amount of calcium loss from bone. It is used to treat too much calcium in your blood from cancer. It is also used to prevent complications of cancer that has spread to the bone. This medicine may be used for other purposes; ask your health care provider or pharmacist if you have questions. COMMON BRAND NAME(S): Zometa What should I tell my health care provider before I take this medicine? They need to know if you have any of these conditions:  aspirin-sensitive asthma  cancer, especially if you are receiving medicines used to treat cancer  dental disease or wear dentures  infection  kidney disease  receiving corticosteroids like dexamethasone or prednisone  an unusual or allergic reaction to zoledronic acid, other medicines, foods, dyes, or preservatives  pregnant or trying to get pregnant  breast-feeding How should I use this medicine? This medicine is for infusion into a vein. It is given by a health care professional in a hospital or clinic setting. Talk to your pediatrician regarding the use of this medicine in children. Special care may be needed. Overdosage: If you think you have taken too much of this medicine contact a poison control center or emergency room at once. NOTE: This medicine is only for you. Do not share this medicine with others. What if I miss a dose? It is important not to miss your dose. Call your doctor or health care professional if you are unable to keep an appointment. What may interact with this medicine?  certain antibiotics given by injection  NSAIDs, medicines for pain and inflammation, like ibuprofen or naproxen  some diuretics like bumetanide, furosemide  teriparatide  thalidomide This list may not describe all possible interactions. Give your health care provider a list of all the medicines, herbs, non-prescription  drugs, or dietary supplements you use. Also tell them if you smoke, drink alcohol, or use illegal drugs. Some items may interact with your medicine. What should I watch for while using this medicine? Visit your doctor or health care professional for regular checkups. It may be some time before you see the benefit from this medicine. Do not stop taking your medicine unless your doctor tells you to. Your doctor may order blood tests or other tests to see how you are doing. Women should inform their doctor if they wish to become pregnant or think they might be pregnant. There is a potential for serious side effects to an unborn child. Talk to your health care professional or pharmacist for more information. You should make sure that you get enough calcium and vitamin D while you are taking this medicine. Discuss the foods you eat and the vitamins you take with your health care professional. Some people who take this medicine have severe bone, joint, and/or muscle pain. This medicine may also increase your risk for jaw problems or a broken thigh bone. Tell your doctor right away if you have severe pain in your jaw, bones, joints, or muscles. Tell your doctor if you have any pain that does not go away or that gets worse. Tell your dentist and dental surgeon that you are taking this medicine. You should not have major dental surgery while on this medicine. See your dentist to have a dental exam and fix any dental problems before starting this medicine. Take good care of your teeth while on this medicine. Make sure you see your dentist for regular follow-up   appointments. What side effects may I notice from receiving this medicine? Side effects that you should report to your doctor or health care professional as soon as possible:  allergic reactions like skin rash, itching or hives, swelling of the face, lips, or tongue  anxiety, confusion, or depression  breathing problems  changes in vision  eye  pain  feeling faint or lightheaded, falls  jaw pain, especially after dental work  mouth sores  muscle cramps, stiffness, or weakness  redness, blistering, peeling or loosening of the skin, including inside the mouth  trouble passing urine or change in the amount of urine Side effects that usually do not require medical attention (report to your doctor or health care professional if they continue or are bothersome):  bone, joint, or muscle pain  constipation  diarrhea  fever  hair loss  irritation at site where injected  loss of appetite  nausea, vomiting  stomach upset  trouble sleeping  trouble swallowing  weak or tired This list may not describe all possible side effects. Call your doctor for medical advice about side effects. You may report side effects to FDA at 1-800-FDA-1088. Where should I keep my medicine? This drug is given in a hospital or clinic and will not be stored at home. NOTE: This sheet is a summary. It may not cover all possible information. If you have questions about this medicine, talk to your doctor, pharmacist, or health care provider.  2020 Elsevier/Gold Standard (2014-03-15 14:19:39)  

## 2019-07-22 NOTE — Addendum Note (Signed)
Addended by: Chauncey Cruel on: 07/22/2019 12:53 PM   Modules accepted: Orders

## 2019-07-23 ENCOUNTER — Telehealth: Payer: Self-pay | Admitting: Oncology

## 2019-07-23 NOTE — Telephone Encounter (Signed)
I left a message regarding schedule  

## 2019-08-08 ENCOUNTER — Ambulatory Visit (HOSPITAL_COMMUNITY): Payer: Medicare Other

## 2019-08-12 ENCOUNTER — Telehealth: Payer: Medicare Other | Admitting: Radiation Oncology

## 2019-08-20 ENCOUNTER — Other Ambulatory Visit: Payer: Self-pay | Admitting: Radiation Therapy

## 2019-09-05 ENCOUNTER — Other Ambulatory Visit: Payer: Self-pay

## 2019-09-05 ENCOUNTER — Ambulatory Visit (HOSPITAL_COMMUNITY)
Admission: RE | Admit: 2019-09-05 | Discharge: 2019-09-05 | Disposition: A | Discharge status: Home / Self Care | Payer: Medicare Other | Source: Ambulatory Visit | Attending: Radiation Oncology | Admitting: Radiation Oncology

## 2019-09-05 DIAGNOSIS — C7951 Secondary malignant neoplasm of bone: Secondary | ICD-10-CM | POA: Diagnosis present

## 2019-09-05 LAB — POCT I-STAT CREATININE: Creatinine, Ser: 0.9 mg/dL (ref 0.44–1.00)

## 2019-09-05 MED ORDER — GADOBUTROL 1 MMOL/ML IV SOLN
6.0000 mL | Freq: Once | INTRAVENOUS | Status: AC | PRN
  Administered 2019-09-05: 13:00:00 6 mL via INTRAVENOUS

## 2019-09-09 ENCOUNTER — Other Ambulatory Visit: Payer: Self-pay

## 2019-09-09 ENCOUNTER — Encounter: Payer: Self-pay | Admitting: Radiation Oncology

## 2019-09-09 ENCOUNTER — Ambulatory Visit
Admission: RE | Admit: 2019-09-09 | Discharge: 2019-09-09 | Disposition: A | Discharge status: Home / Self Care | Payer: Medicare Other | Source: Ambulatory Visit | Attending: Radiation Oncology | Admitting: Radiation Oncology

## 2019-09-09 DIAGNOSIS — C7951 Secondary malignant neoplasm of bone: Secondary | ICD-10-CM

## 2019-09-09 DIAGNOSIS — Z170 Estrogen receptor positive status [ER+]: Secondary | ICD-10-CM

## 2019-09-09 DIAGNOSIS — C50812 Malignant neoplasm of overlapping sites of left female breast: Secondary | ICD-10-CM

## 2019-09-09 NOTE — Progress Notes (Signed)
Radiation Oncology         [phone] ________________________________  Outpatient Follow Up - Conducted via telephone due to current COVID-19 concerns for limiting patient exposure  I spoke with the patient to conduct this visit via telephone to spare the patient unnecessary potential exposure in the healthcare setting during the current COVID-19 pandemic. The patient was notified in advance and was offered a [place] meeting to allow for face to face communication but unfortunately reported that they did not have the appropriate resources/technology to support such a visit and instead preferred to proceed with a telephone visit.  ________________________________  Name: Jessica Glenn MRN: [id number]  Date of Service: 09/09/2019  DOB: [birth year]  Follow Up Note  CC: Jessica Dus, MD  Jessica Dus, MD  Diagnosis: Progressive metastatic stage II-III, T3 N0 M1 grade 1 ER/PR Positive invasive ductal carcinoma of the left breast with a T2 lesion  Interval Since Last Radiation: 14 months  07/05/2018 Stereotactic Radiosurgery: T2 Spine // 18 Gy in 1 fraction  05/01/2018 - 06/15/2018: The patient initially received a dose of 50.4 Gy in 28 fractions to the left chest wall and supraclavicular region. This was delivered using a 3-D conformal, 4 field technique. The patient then received a boost to the mastectomy scar. This delivered an additional 10 Gy in 5 fractions using an en face electron field. The total dose was 60.4 Gy.  Narrative: In summary the patient was diagnosed with stage II or stage III breast disease initially, and on staging imaging was found to have a lytic appearing lesion at T2.  She went on to proceed with mastectomy of the left breast followed by postmastectomy radiation to the left chest wall and regional nodes.  She then went on to receive stereotactic radiosurgery to the T-spine and one fraction.  She is followed in surveillance and an MRI on 09/05/2019 was performed a bit late  due to her concerns of risk of encountering the coronavirus. She did have the MRI and it showed stability at T2 and small indeterminate change at right T7 pedicle and posterior T8 body. Discussion in multidisciplinary brain and spine conference this morning was to continue to follow up in 4 months with a complete spine MRI.               On review of systems, the patient reports that she is doing well overall. She denies any chest pain, shortness of breath, cough, fevers, chills, night sweats, unintended weight changes. She denies any bowel or bladder disturbances, and denies abdominal pain, nausea or vomiting. She  denies any new musculoskeletal or any prior back pain. She has not had other joint aches or pains of any other sort, new skin lesions or concerns. A complete review of systems is obtained and is otherwise negative.  Past Medical History:  Past Medical History:  Diagnosis Date   Basal cell carcinoma    s/p Moh's surgery  to left side of nose   Breast cancer, left breast ([place]) dx'd 05/2017   History of tobacco use    Varicose vein of leg    BLE    Past Surgical History: Past Surgical History:  Procedure Laterality Date   BREAST BIOPSY Left 05/2017   CATARACT EXTRACTION W/ INTRAOCULAR LENS  IMPLANT, BILATERAL Bilateral    DILATION AND CURETTAGE OF UTERUS     FRACTURE SURGERY     HARDWARE REMOVAL Right 02/14/2017   Procedure: RIGHT ANKLE HARDWARE REMOVAL;  Surgeon: Jessica Butters, MD;  Location: [facility];  Service: Orthopedics;  Laterality: Right;   I&D EXTREMITY Right 02/14/2017   Procedure: RIGHT IRRIGATION AND DEBRIDEMENT ANKLE;  Surgeon: Jessica Butters, MD;  Location: [place];  Service: Orthopedics;  Laterality: Right;   MASTECTOMY Left 02/14/2018   LEFT MASTECTOMY WITH DEEP LEFT AXILLARY SENTINEL LYMPH NODE BIOPSY    MASTECTOMY W/ SENTINEL NODE BIOPSY Left 02/14/2018   Procedure: LEFT MASTECTOMY WITH SENTINEL LYMPH NODE BIOPSY  AND POSSIBLE NODE DISSECTION;  Surgeon: Jessica Kussmaul, MD;  Location: [facility];  Service: General;  Laterality: Left;   MOHS SURGERY Left    "side of nose"   ORIF ANKLE FRACTURE Right 02/02/2016   Procedure: OPEN REDUCTION INTERNAL FIXATION (ORIF) ANKLE FRACTURE;  Surgeon: Jessica Butters, MD;  Location: [place];  Service: Orthopedics;  Laterality: Right;  strykermini c armreg bedbone foam    Social History:  Social History   Socioeconomic History   Marital status: Married    Spouse name: Not on file   Number of children: Not on file   Years of education: Not on file   Highest education level: Not on file  Occupational History   Not on file  Social Needs   Financial resource strain: Not on file   Food insecurity    Worry: Not on file    Inability: Not on file   Transportation needs    Medical: Not on file    Non-medical: Not on file  Tobacco Use   Smoking status: Former Smoker    Packs/day: 1.00    Years: 8.00    Pack years: 8.00    Types: Cigarettes    Quit date: 1960    Years since quitting: 60.8   Smokeless tobacco: Never Used  Substance and Sexual Activity   Alcohol use: Yes    Alcohol/week: 5.0 standard drinks    Types: 5 Glasses of wine per week   Drug use: No   Sexual activity: Not Currently  Lifestyle   Physical activity    Days per week: Not on file    Minutes per session: Not on file   Stress: Not on file  Relationships   Social connections    Talks on phone: Not on file    Gets together: Not on file    Attends religious service: Not on file    Active member of club or organization: Not on file    Attends meetings of clubs or organizations: Not on file    Relationship status: Not on file   Intimate partner violence    Fear of current or ex partner: No    Emotionally abused: No    Physically abused: No    Forced sexual activity: No  Other Topics Concern   Not on file  Social History Narrative   Not on file    The patient is married.  She has 4 sons.  Her husband is a retired Theme park manager and they live in [place].  Family History: Family History  Problem Relation Age of Onset   COPD Mother    Emphysema Mother      ALLERGIES:  has No Known Allergies.  Meds: Current Outpatient Medications  Medication Sig Dispense Refill   anastrozole (ARIMIDEX) 1 MG tablet Take 1 tablet (1 mg total) by mouth daily. 90 tablet 4   ascorbic acid (VITAMIN C) 1000 MG tablet Take 1,000 mg by mouth daily.     Calcium Carb-Cholecalciferol (CALCIUM 600 + D  PO) Take 1 tablet by mouth daily.     Cholecalciferol (VITAMIN D3) 3000 units TABS Take 3,000 Units by mouth daily.     Cyanocobalamin (B-12) 5000 MCG CAPS Take 5,000 mcg by mouth daily.     diphenhydramine-acetaminophen (TYLENOL PM) 25-500 MG TABS tablet Take 1-2 tablets by mouth at bedtime.      doxylamine, Sleep, (UNISOM) 25 MG tablet Take 25 mg by mouth at bedtime as needed for sleep.     Multiple Vitamin (MULTIVITAMIN WITH MINERALS) TABS tablet Take 1 tablet by mouth daily.     No current facility-administered medications for this encounter.     Physical Findings: Unable to assess due to nature of encounter   Lab Findings: Lab Results  Component Value Date   WBC 5.7 07/22/2019   HGB 13.9 07/22/2019   HCT 42.4 07/22/2019   MCV 92.6 07/22/2019   PLT 148 (L) 07/22/2019     Radiographic Findings: Mr Thoracic Spine W Wo Contrast  Result Date: 09/05/2019 CLINICAL DATA:  85 year old female with metastatic breast cancer. Post treatment restaging. EXAM: MRI THORACIC WITHOUT AND WITH CONTRAST TECHNIQUE: Multiplanar and multiecho pulse sequences of the thoracic spine were obtained without and with intravenous contrast. CONTRAST:  62mL GADAVIST GADOBUTROL 1 MMOL/ML IV SOLN COMPARISON:  Thoracic spine MRI 04/04/2019 and earlier. FINDINGS: Limited cervical spine imaging: Mildly heterogeneous bone marrow signal, no definite metastasis. Thoracic spine  segmentation:  Normal. Alignment: Stable thoracic kyphosis since June. No spondylolisthesis. Vertebrae: Stable posterior T2 vertebral metastasis with unchanged enhancement. Patchy STIR hyperintensity and enhancement in the right T7 pedicle is stable and remains indeterminate. Likewise, a small enhancing T8 vertebral body lesion posteriorly on the left is stable measuring 7 millimeters diameter, and located just posterior to a benign hemangioma which has intrinsic T1 hyperintensity. No other thoracic vertebral metastasis identified. Degenerative appearing endplate marrow signal changes T9-T10 and inferiorly. Trace associated marrow edema and enhancement not significantly changed. Cord: Visible spinal cord is normal. No abnormal intradural enhancement. No dural thickening. Paraspinal and other soft tissues: Negative visible chest and stable visible upper abdominal viscera. A small T2 hyperintense lesion in the posterior right hepatic lobe appears stable and is probably benign (series 7, image 44). Negative visible paraspinal soft tissues. Disc levels: Stable degenerative changes. No spinal stenosis. Mild to moderate T10 and T11 neural foraminal stenosis, in part related to facet hypertrophy. IMPRESSION: 1. Stable MRI appearance of the thoracic spine since June 2020. Posterior T2 vertebral metastasis without extraosseous extension. Small indeterminate lesions in the right T7 pedicle and posterior T8 body. 2. Stable superimposed thoracic spine degeneration with no spinal stenosis. Electronically Signed   By: Genevie Ann M.D.   On: 09/05/2019 20:13    Impression/Plan: 1. Progressive Metastatic Stage II-III [id number], grade1, ER/PR positive invasive ductal carcinoma of the left breast with disease to the thoracic spine.  Patient appears to be doing extremely well from a functional status, we also reviewed that her MRI is stable in appearance at the posterior T2 site where she received previous therapy.  She has small  indeterminate lesions at the right T7 pedicle and posterior T8 body, and degenerative changes of the spine without stenosis.  We will plan to follow-up with her and perform repeat MRI of the complete spine in approximately 4 months time.  She is in agreement with this plan.  She will also continue to follow-up with Dr. Jana Hakim while taking Arimidex and Zometa.  She is encouraged to call if she has any questions or  concerns prior to her next visit.  Given current concerns for patient exposure during the COVID-19 pandemic, this encounter was conducted via telephone.  The patient has given verbal consent for this type of encounter. The time spent during this encounter was 10 minutes and 50% of that time was spent in the coordination of his care. The attendants for this meeting include Shona Simpson, [facility] and [name]  During the encounter, Shona Simpson [facility] was located at home remotely.   [name]  was located at home.     Carola Rhine, PAC

## 2019-09-13 NOTE — Progress Notes (Signed)
Sure enough ! Val

## 2019-10-10 ENCOUNTER — Ambulatory Visit
Admission: RE | Admit: 2019-10-10 | Discharge: 2019-10-10 | Disposition: A | Discharge status: Home / Self Care | Payer: Medicare Other | Source: Ambulatory Visit | Attending: Family Medicine | Admitting: Family Medicine

## 2019-10-10 ENCOUNTER — Other Ambulatory Visit: Payer: Self-pay

## 2019-10-10 DIAGNOSIS — E2839 Other primary ovarian failure: Secondary | ICD-10-CM

## 2019-11-18 DIAGNOSIS — C4492 Squamous cell carcinoma of skin, unspecified: Secondary | ICD-10-CM

## 2019-11-18 HISTORY — DX: Squamous cell carcinoma of skin, unspecified: C44.92

## 2020-01-01 ENCOUNTER — Other Ambulatory Visit: Payer: Self-pay | Admitting: Radiation Therapy

## 2020-01-01 DIAGNOSIS — C7951 Secondary malignant neoplasm of bone: Secondary | ICD-10-CM

## 2020-01-02 ENCOUNTER — Other Ambulatory Visit: Payer: Self-pay | Admitting: Radiation Therapy

## 2020-01-03 ENCOUNTER — Other Ambulatory Visit: Payer: Self-pay | Admitting: *Deleted

## 2020-01-08 ENCOUNTER — Telehealth: Payer: Self-pay | Admitting: Oncology

## 2020-01-08 NOTE — Telephone Encounter (Signed)
R/s appt per 3/10 sch message - pt is aware of appt date and time

## 2020-01-16 NOTE — Progress Notes (Signed)
Pharmacist Chemotherapy Monitoring - Follow Up Assessment    I verify that I have reviewed each item in the below checklist:  . Regimen for the patient is scheduled for the appropriate day and plan matches scheduled date. Marland Kitchen Appropriate non-routine labs are ordered dependent on drug ordered. . If applicable, additional medications reviewed and ordered per protocol based on lifetime cumulative doses and/or treatment regimen.   Plan for follow-up and/or issues identified: No . I-vent associated with next due treatment: No  Britt Boozer 01/16/2020 10:41 AM

## 2020-01-20 ENCOUNTER — Ambulatory Visit: Payer: Medicare Other | Admitting: Oncology

## 2020-01-20 ENCOUNTER — Ambulatory Visit: Payer: Medicare Other

## 2020-01-20 ENCOUNTER — Other Ambulatory Visit: Payer: Medicare Other

## 2020-01-21 NOTE — Progress Notes (Signed)
[facility]  [phone] [phone]     ID: Jessica Glenn DOB: Mar 28, 1929  MR#: [id number]  JOA#:[id number]  Patient Care Team: Maury Dus, MD as PCP - General (Family Medicine) Magrinat, Virgie Dad, MD as Consulting Physician (Oncology) Jovita Kussmaul, MD as Consulting Physician (General Surgery) Kyung Rudd, MD as Consulting Physician (Radiation Oncology) OTHER MD:  CHIEF COMPLAINT: Locally advanced/metastatic estrogen receptor positive left breast cancer  CURRENT TREATMENT: Anastrozole; zoledronate   INTERVAL HISTORY: Jessica Glenn returns today for follow-up and treatment of her metastatic breast cancer.   She continues on anastrozole.  She has had absolutely no side effects from this medication that she is aware of.  She obtains it under very good price.  She also received zolendronate, most recently on 07/22/2019 with a dose due today.  She thinks she feels a little down on the second day.  She has not been taking additional calcium on treatment days.  Since her last visit, she underwent bone density screening on 10/10/2019. This showed a T-score of -2.0, which is considered osteopenic.  She also underwent surveillance thoracic spine MRI on 09/05/2019, which was stable.  She is scheduled for total spine mets screening MRI on 01/29/2020.   REVIEW OF SYSTEMS: Jessica Glenn's birthday was two days ago.  She went to the beach with friends and did a lot of walking which made her unusually tired.  She has not been exercising regularly.  She has been having some sleep issues and started taking mirtazapine.  She was not aware that that might cause an increase in appetite but we did discuss that today.  She has not yet received the Covid vaccines because she is "not sure" about them.  She had a squamous cell removed from her left lower leg by her [place] dermatologist.  Aside from these issues a detailed review of systems today was stable   HISTORY OF CURRENT ILLNESS:  From the original intake note:  Jessica Glenn tells me she first noted a changes in her left breast about a year and a half ago. She had had some dental work around that time and thought possibly that could be related. She said her breast felt "like steel". She did not bring it to medical attention until her recent appointment with Dr. Alyson Ingles and he set her up for bilateral diagnostic mammography with tomography and left breast ultrasonography at the [facility] 06/19/2017. This found the breast density to be category C. In the upper outer retroareolar left breast there was an irregular mass estimated to be at least 6 cm by mammography. There were heterogeneous calcifications within the mass. There was skin thickening of the areola and an enlarged inferior left axillary lymph node was noted. On exam there was a large fixed hard mass in the upper outer quadrant of the left breast causing protrusion of the areola. It measured approximately 7 cm by palpation and there was visible skin thickening. In the inferior left axilla there was a firm palpable mass measuring 2 cm.  Ultrasound confirmed an irregular hypoechoic vascular mass in the left breast centered on the 1:00 position 4 cm from the nipple measuring 6.7 cm. This was abutting the overlying skin. It was a 2.3 cm hypoechoic vascular mass in the left axilla. The right breast was unremarkable.  On 06/22/2017 the patient had left breast and left axillary node biopsy, showing ([id number]) both sites to be involved by invasive ductal carcinoma, grade 2, with prognostic panels on both biopsies showing estrogen  receptor 100% positivity, progesterone receptor 40-50% positivity, all with strong staining intensity, MIB-1:30 percent in the breast and 15% in the lymph nodes, and both HER-2 negative, with ratios of 0.76-0.85 and number per cell 1.10-1.30.  The patient's subsequent history is as detailed below.   PAST MEDICAL HISTORY: Past Medical History:  Diagnosis  Date  . Basal cell carcinoma    s/p Moh's surgery  to left side of nose  . Breast cancer, left breast ([place]) dx'd 05/2017  . History of tobacco use   . Varicose vein of leg    BLE    PAST SURGICAL HISTORY: Past Surgical History:  Procedure Laterality Date  . BREAST BIOPSY Left 05/2017  . CATARACT EXTRACTION W/ INTRAOCULAR LENS  IMPLANT, BILATERAL Bilateral   . DILATION AND CURETTAGE OF UTERUS    . FRACTURE SURGERY    . HARDWARE REMOVAL Right 02/14/2017   Procedure: RIGHT ANKLE HARDWARE REMOVAL;  Surgeon: [name], MD;  Location: [facility];  Service: Orthopedics;  Laterality: Right;  . I & D EXTREMITY Right 02/14/2017   Procedure: RIGHT IRRIGATION AND DEBRIDEMENT ANKLE;  Surgeon: [name], MD;  Location: [facility];  Service: Orthopedics;  Laterality: Right;  . MASTECTOMY Left 02/14/2018   LEFT MASTECTOMY WITH DEEP LEFT AXILLARY SENTINEL LYMPH NODE BIOPSY   . MASTECTOMY W/ SENTINEL NODE BIOPSY Left 02/14/2018   Procedure: LEFT MASTECTOMY WITH SENTINEL LYMPH NODE BIOPSY AND POSSIBLE NODE DISSECTION;  Surgeon: Jovita Kussmaul, MD;  Location: [facility];  Service: General;  Laterality: Left;  . MOHS SURGERY Left    "side of nose"  . ORIF ANKLE FRACTURE Right 02/02/2016   Procedure: OPEN REDUCTION INTERNAL FIXATION (ORIF) ANKLE FRACTURE;  Surgeon: [name], MD;  Location: [facility];  Service: Orthopedics;  Laterality: Right;  strykermini c armreg bedbone foam    FAMILY HISTORY Family History  Problem Relation Age of Onset  . COPD Mother   . Emphysema Mother   The patient's father had a history of alcoholism and a band in the family. The patient has no information on the paternal side. The patient's mother died from emphysema at the age of 35. The patient has one sister, 2 brothers. There is no history of breast or ovarian cancer in the family to her knowledge.   GYNECOLOGIC HISTORY:  No LMP recorded. Patient is  postmenopausal. Menarche age 59, first live birth age 74, the patient is [place] P4. She went through the change of life in her 18s. She took hormone replacement for a few years.    SOCIAL HISTORY:  The patient is originally from [place]. Her husband Laurey Arrow is a traveling Theme park manager, nondenominational. Son Laurey Arrow lives in [place] where he works as a Theme park manager; son Ronalee Belts lives in [place] where he works as an Optometrist; son Annie Main lives in [place] where he works as a Chief Strategy Officer; son Rodman Key also lives in [place] and works as a Chief Strategy Officer. The patient has 14 grandchildren and 62 great-grandchildren    ADVANCED DIRECTIVES: In place   HEALTH MAINTENANCE: Social History   Tobacco Use  . Smoking status: Former Smoker    Packs/day: 1.00    Years: 8.00    Pack years: 8.00    Types: Cigarettes    Quit date: 1960    Years since quitting: 61.2  . Smokeless tobacco: Never Used  Substance Use Topics  . Alcohol use: Yes    Alcohol/week: 5.0 standard drinks    Types: 5 Glasses  of wine per week  . Drug use: No     Colonoscopy: Never  PAP:  Bone density: Osteopenia   No Known Allergies  Current Outpatient Medications  Medication Sig Dispense Refill  . anastrozole (ARIMIDEX) 1 MG tablet Take 1 tablet (1 mg total) by mouth daily. 90 tablet 4  . ascorbic acid (VITAMIN C) 1000 MG tablet Take 1,000 mg by mouth daily.    . Calcium Carb-Cholecalciferol (CALCIUM 600 + D PO) Take 1 tablet by mouth daily.    . Cholecalciferol (VITAMIN D3) 3000 units TABS Take 3,000 Units by mouth daily.    . Cyanocobalamin (B-12) 5000 MCG CAPS Take 5,000 mcg by mouth daily.    . diphenhydramine-acetaminophen (TYLENOL PM) 25-500 MG TABS tablet Take 1-2 tablets by mouth at bedtime.     Marland Kitchen doxylamine, Sleep, (UNISOM) 25 MG tablet Take 25 mg by mouth at bedtime as needed for sleep.    . Multiple Vitamin (MULTIVITAMIN WITH MINERALS) TABS tablet Take 1 tablet by mouth daily.     No current facility-administered  medications for this visit.    OBJECTIVE: White woman in no acute distress  Vitals:   01/22/20 0810  BP: (!) 175/87  Pulse: 79  Resp: 18  Temp: 98.3 F (36.8 C)  SpO2: 97%   Wt Readings from Last 3 Encounters:  01/22/20 163 lb 4.8 oz (74.1 kg)  07/22/19 150 lb 1.6 oz (68.1 kg)  09/03/18 160 lb 6.4 oz (72.8 kg)   Body mass index is 27.17 kg/m.    ECOG FS:1 - Symptomatic but completely ambulatory  Sclerae unicteric, EOMs intact Wearing a mask No cervical or supraclavicular adenopathy Lungs no rales or rhonchi Heart regular rate and rhythm Abd soft, nontender, positive bowel sounds MSK no focal spinal tenderness, no upper extremity lymphedema Neuro: nonfocal, well oriented, appropriate affect Breasts: The right breast is benign.  The left breast is status post mastectomy.  There is no evidence of chest wall recurrence.  Both axillae are benign.   LAB RESULTS:  CMP     Component Value Date/Time   NA 140 07/22/2019 1052   NA 140 10/27/2017 0908   K 4.3 07/22/2019 1052   K 4.8 10/27/2017 0908   CL 105 07/22/2019 1052   CO2 26 07/22/2019 1052   CO2 27 10/27/2017 0908   GLUCOSE 95 07/22/2019 1052   GLUCOSE 96 10/27/2017 0908   BUN 19 07/22/2019 1052   BUN 20.5 10/27/2017 0908   CREATININE 0.90 09/05/2019 1211   CREATININE 0.9 10/27/2017 0908   CALCIUM 9.9 07/22/2019 1052   CALCIUM 9.7 10/27/2017 0908   PROT 7.3 07/22/2019 1052   PROT 7.2 10/27/2017 0908   ALBUMIN 4.6 07/22/2019 1052   ALBUMIN 4.2 10/27/2017 0908   AST 19 07/22/2019 1052   AST 19 10/27/2017 0908   ALT 7 07/22/2019 1052   ALT 8 10/27/2017 0908   ALKPHOS 48 07/22/2019 1052   ALKPHOS 58 10/27/2017 0908   BILITOT 0.5 07/22/2019 1052   BILITOT 0.62 10/27/2017 0908   GFRNONAA 52 (L) 07/22/2019 1052   GFRAA >60 07/22/2019 1052    No results found for: TOTALPROTELP, ALBUMINELP, A1GS, A2GS, BETS, BETA2SER, GAMS, MSPIKE, SPEI  No results found for: KPAFRELGTCHN, LAMBDASER, KAPLAMBRATIO  Lab  Results  Component Value Date   WBC 6.0 01/22/2020   NEUTROABS 3.1 01/22/2020   HGB 14.2 01/22/2020   HCT 44.5 01/22/2020   MCV 92.9 01/22/2020   PLT 164 01/22/2020   Lab Results  Component Value  Date   CA2729 61.6 (H) 07/05/2018    Urinalysis No results found for: COLORURINE, APPEARANCEUR, LABSPEC, PHURINE, GLUCOSEU, HGBUR, BILIRUBINUR, KETONESUR, PROTEINUR, UROBILINOGEN, NITRITE, LEUKOCYTESUR   STUDIES: No results found.   ELIGIBLE FOR AVAILABLE RESEARCH PROTOCOL: no  ASSESSMENT: 86 y.o. [place] woman status post left breast overlapping sites biopsy 06/22/2017 for a clinical T3 N1-2, stage II-III invasive ductal carcinoma, grade 2, estrogen and progesterone receptor positive, HER-2 nonamplified, with an MIB-1 of 30%  (a) staging CT of the chest and bone survey and bone scan July 11, 2017 showed multiple indeterminant subcentimeter pulmonary nodules which will require follow-up, as well as an indeterminate right hepatic lobe lesion, but no definite evidence of malignancy.  (b) CA-27-29 is informative  (1) neo-adjuvant fulvestrant started on 07/05/2017, discontinued 12/20/2017 with possible progression  (2) status post left mastectomy and sentinel lymph node sampling 02/14/2018 for a T3 N1, stage IIA invasive ductal carcinoma, grade 1, with negative margins.  Repeat prognostic panel again estrogen and progesterone receptor positive, HER-2 negative  (a) 2 of 7 removed lymph nodes were positive  (3) adjuvant radiation to follow surgery  (4) anastrozole started 01/17/2018  (a) start palbociclib once disease progression is documented  (5) METASTATIC DISEASE: April 2019  (a) CT scan of the chest 02/05/2018 shows a new lesion at T2  (b) total spinal MRI 03/14/2018 confirms lesion at T2 and possible minimal lesion at T8, neither amenable to biopsy; there are no other suspicious lesions  (6)  adjuvant radiation 05/01/2018 -06/15/2018  (a) received capecitabine  sensitization given the extent of local disease  (b) Site/dose:   The patient initially received a dose of 50.4 Gy in 28 fractions to the left chest wall and supraclavicular region. This was delivered using a 3-D conformal, 4 field technique. The patient then received a boost to the mastectomy scar. This delivered an additional 10 Gy in 5 fractions using an en face electron field. The total dose was 60.4 Gy  (c) stereotactic radiosurgery to T2 given 07/05/2018  (7) started zolendronate 07/11/2018, to be repeated every 12 weeks  (a) bone density 10/10/2019 shows a T score of -2.0   PLAN: Jessica Glenn is now 2 years out from definitive diagnosis of metastatic breast cancer.  Clinically there is no evidence of disease activity.  This is very favorable.  She is receiving anastrozole with no side effects and the plan is to continue this medication until there is evidence of disease progression.  She is already scheduled for repeat total spinal MRI at the end of this month.  She has not had a right mammogram in 2 years and we will arrange for that this year.  For some reason she did not receive zoledronate in December.  I will go ahead and schedule her for every 3 months treatments through this year.  She is "unsure" about receiving the vaccine.  Strongly urged her to receive one of her vaccine she can sign up for first.  I also encouraged her to exercise more regularly.  She will see [place] again in 3 months.  She knows to call for any other issue that may develop before then.  Total encounter time 35 minutes.*  Magrinat, Virgie Dad, MD  01/22/20 8:27 AM Medical Oncology and Hematology [facility] [place], [place] [address] Tel. [phone]    Fax. [phone]   I, Wilburn Mylar, am acting as scribe for Dr. Virgie Dad. Magrinat.  Lindie Spruce MD, have reviewed the above  documentation for accuracy and completeness, and I agree with the above.    *Total Encounter  Time as defined by the Centers for Medicare and Medicaid Services includes, in addition to the face-to-face time of a patient visit (documented in the note above) non-face-to-face time: obtaining and reviewing outside history, ordering and reviewing medications, tests or procedures, care coordination (communications with other health care professionals or caregivers) and documentation in the medical record.

## 2020-01-22 ENCOUNTER — Other Ambulatory Visit: Payer: Self-pay

## 2020-01-22 ENCOUNTER — Inpatient Hospital Stay: Payer: Medicare Other | Attending: Oncology

## 2020-01-22 ENCOUNTER — Inpatient Hospital Stay: Payer: Medicare Other | Admitting: Oncology

## 2020-01-22 ENCOUNTER — Inpatient Hospital Stay: Payer: Medicare Other

## 2020-01-22 VITALS — BP 175/87 | HR 79 | Temp 98.3°F | Resp 18 | Ht 65.0 in | Wt 163.3 lb

## 2020-01-22 DIAGNOSIS — C50812 Malignant neoplasm of overlapping sites of left female breast: Secondary | ICD-10-CM

## 2020-01-22 DIAGNOSIS — C50412 Malignant neoplasm of upper-outer quadrant of left female breast: Secondary | ICD-10-CM | POA: Insufficient documentation

## 2020-01-22 DIAGNOSIS — Z170 Estrogen receptor positive status [ER+]: Secondary | ICD-10-CM

## 2020-01-22 DIAGNOSIS — M8580 Other specified disorders of bone density and structure, unspecified site: Secondary | ICD-10-CM | POA: Insufficient documentation

## 2020-01-22 DIAGNOSIS — Z79811 Long term (current) use of aromatase inhibitors: Secondary | ICD-10-CM | POA: Insufficient documentation

## 2020-01-22 DIAGNOSIS — C50912 Malignant neoplasm of unspecified site of left female breast: Secondary | ICD-10-CM

## 2020-01-22 LAB — CBC WITH DIFFERENTIAL/PLATELET
Abs Immature Granulocytes: 0 10*3/uL (ref 0.00–0.07)
Basophils Absolute: 0 10*3/uL (ref 0.0–0.1)
Basophils Relative: 1 %
Eosinophils Absolute: 0.3 10*3/uL (ref 0.0–0.5)
Eosinophils Relative: 5 %
HCT: 44.5 % (ref 36.0–46.0)
Hemoglobin: 14.2 g/dL (ref 12.0–15.0)
Immature Granulocytes: 0 %
Lymphocytes Relative: 32 %
Lymphs Abs: 1.9 10*3/uL (ref 0.7–4.0)
MCH: 29.6 pg (ref 26.0–34.0)
MCHC: 31.9 g/dL (ref 30.0–36.0)
MCV: 92.9 fL (ref 80.0–100.0)
Monocytes Absolute: 0.6 10*3/uL (ref 0.1–1.0)
Monocytes Relative: 10 %
Neutro Abs: 3.1 10*3/uL (ref 1.7–7.7)
Neutrophils Relative %: 52 %
Platelets: 164 10*3/uL (ref 150–400)
RBC: 4.79 MIL/uL (ref 3.87–5.11)
RDW: 12.3 % (ref 11.5–15.5)
WBC: 6 10*3/uL (ref 4.0–10.5)
nRBC: 0 % (ref 0.0–0.2)

## 2020-01-22 LAB — COMPREHENSIVE METABOLIC PANEL
ALT: 8 U/L (ref 0–44)
AST: 18 U/L (ref 15–41)
Albumin: 4.2 g/dL (ref 3.5–5.0)
Alkaline Phosphatase: 56 U/L (ref 38–126)
Anion gap: 8 (ref 5–15)
BUN: 17 mg/dL (ref 8–23)
CO2: 26 mmol/L (ref 22–32)
Calcium: 9.3 mg/dL (ref 8.9–10.3)
Chloride: 104 mmol/L (ref 98–111)
Creatinine, Ser: 0.91 mg/dL (ref 0.44–1.00)
GFR calc Af Amer: >60 mL/min (ref >60)
GFR calc non Af Amer: 56 mL/min — ABNORMAL LOW (ref >60)
Glucose, Bld: 95 mg/dL (ref 70–99)
Potassium: 4.2 mmol/L (ref 3.5–5.1)
Sodium: 138 mmol/L (ref 135–145)
Total Bilirubin: 0.4 mg/dL (ref 0.3–1.2)
Total Protein: 7.3 g/dL (ref 6.5–8.1)

## 2020-01-22 MED ORDER — ZOLEDRONIC ACID 4 MG/5ML IV CONC: 4.0000 mg | Freq: Once | INTRAVENOUS | Status: DC

## 2020-01-22 MED ORDER — SODIUM CHLORIDE 0.9 % IV SOLN
INTRAVENOUS | Status: DC
  Filled 2020-01-22 (×2): qty 250

## 2020-01-22 MED ORDER — ZOLEDRONIC ACID 4 MG/100ML IV SOLN
4.0000 mg | Freq: Once | INTRAVENOUS | Status: AC
  Administered 2020-01-22: 4 mg via INTRAVENOUS

## 2020-01-22 MED ORDER — ZOLEDRONIC ACID 4 MG/100ML IV SOLN
INTRAVENOUS | Status: AC
  Filled 2020-01-22: qty 100

## 2020-01-22 NOTE — Patient Instructions (Signed)
Zoledronic Acid injection (Hypercalcemia, Oncology) What is this medicine? ZOLEDRONIC ACID (ZOE le dron ik AS id) lowers the amount of calcium loss from bone. It is used to treat too much calcium in your blood from cancer. It is also used to prevent complications of cancer that has spread to the bone. This medicine may be used for other purposes; ask your health care provider or pharmacist if you have questions. COMMON BRAND NAME(S): Zometa What should I tell my health care provider before I take this medicine? They need to know if you have any of these conditions:  aspirin-sensitive asthma  cancer, especially if you are receiving medicines used to treat cancer  dental disease or wear dentures  infection  kidney disease  receiving corticosteroids like dexamethasone or prednisone  an unusual or allergic reaction to zoledronic acid, other medicines, foods, dyes, or preservatives  pregnant or trying to get pregnant  breast-feeding How should I use this medicine? This medicine is for infusion into a vein. It is given by a health care professional in a hospital or clinic setting. Talk to your pediatrician regarding the use of this medicine in children. Special care may be needed. Overdosage: If you think you have taken too much of this medicine contact a poison control center or emergency room at once. NOTE: This medicine is only for you. Do not share this medicine with others. What if I miss a dose? It is important not to miss your dose. Call your doctor or health care professional if you are unable to keep an appointment. What may interact with this medicine?  certain antibiotics given by injection  NSAIDs, medicines for pain and inflammation, like ibuprofen or naproxen  some diuretics like bumetanide, furosemide  teriparatide  thalidomide This list may not describe all possible interactions. Give your health care provider a list of all the medicines, herbs, non-prescription  drugs, or dietary supplements you use. Also tell them if you smoke, drink alcohol, or use illegal drugs. Some items may interact with your medicine. What should I watch for while using this medicine? Visit your doctor or health care professional for regular checkups. It may be some time before you see the benefit from this medicine. Do not stop taking your medicine unless your doctor tells you to. Your doctor may order blood tests or other tests to see how you are doing. Women should inform their doctor if they wish to become pregnant or think they might be pregnant. There is a potential for serious side effects to an unborn child. Talk to your health care professional or pharmacist for more information. You should make sure that you get enough calcium and vitamin D while you are taking this medicine. Discuss the foods you eat and the vitamins you take with your health care professional. Some people who take this medicine have severe bone, joint, and/or muscle pain. This medicine may also increase your risk for jaw problems or a broken thigh bone. Tell your doctor right away if you have severe pain in your jaw, bones, joints, or muscles. Tell your doctor if you have any pain that does not go away or that gets worse. Tell your dentist and dental surgeon that you are taking this medicine. You should not have major dental surgery while on this medicine. See your dentist to have a dental exam and fix any dental problems before starting this medicine. Take good care of your teeth while on this medicine. Make sure you see your dentist for regular follow-up   appointments. What side effects may I notice from receiving this medicine? Side effects that you should report to your doctor or health care professional as soon as possible:  allergic reactions like skin rash, itching or hives, swelling of the face, lips, or tongue  anxiety, confusion, or depression  breathing problems  changes in vision  eye  pain  feeling faint or lightheaded, falls  jaw pain, especially after dental work  mouth sores  muscle cramps, stiffness, or weakness  redness, blistering, peeling or loosening of the skin, including inside the mouth  trouble passing urine or change in the amount of urine Side effects that usually do not require medical attention (report to your doctor or health care professional if they continue or are bothersome):  bone, joint, or muscle pain  constipation  diarrhea  fever  hair loss  irritation at site where injected  loss of appetite  nausea, vomiting  stomach upset  trouble sleeping  trouble swallowing  weak or tired This list may not describe all possible side effects. Call your doctor for medical advice about side effects. You may report side effects to FDA at 1-800-FDA-1088. Where should I keep my medicine? This drug is given in a hospital or clinic and will not be stored at home. NOTE: This sheet is a summary. It may not cover all possible information. If you have questions about this medicine, talk to your doctor, pharmacist, or health care provider.  2020 Elsevier/Gold Standard (2014-03-15 14:19:39)  

## 2020-01-23 ENCOUNTER — Telehealth: Payer: Self-pay | Admitting: Oncology

## 2020-01-23 NOTE — Telephone Encounter (Signed)
Scheduled appts per 3/24 los. Pt confirmed appt dates and times. Mailed appt reminder and calendar per pt's request.

## 2020-01-24 ENCOUNTER — Ambulatory Visit (HOSPITAL_COMMUNITY): Payer: Medicare Other

## 2020-01-29 ENCOUNTER — Ambulatory Visit: Payer: Self-pay | Admitting: Radiation Oncology

## 2020-01-29 ENCOUNTER — Ambulatory Visit (HOSPITAL_COMMUNITY)
Admission: RE | Admit: 2020-01-29 | Discharge: 2020-01-29 | Disposition: A | Discharge status: Home / Self Care | Payer: Medicare Other | Source: Ambulatory Visit | Attending: Radiation Oncology | Admitting: Radiation Oncology

## 2020-01-29 ENCOUNTER — Other Ambulatory Visit: Payer: Self-pay

## 2020-01-29 DIAGNOSIS — C7951 Secondary malignant neoplasm of bone: Secondary | ICD-10-CM

## 2020-01-29 MED ORDER — GADOBUTROL 1 MMOL/ML IV SOLN
7.5000 mL | Freq: Once | INTRAVENOUS | Status: AC | PRN
  Administered 2020-01-29: 13:00:00 7.5 mL via INTRAVENOUS

## 2020-02-03 ENCOUNTER — Inpatient Hospital Stay: Payer: Medicare Other | Attending: Radiation Oncology

## 2020-02-03 ENCOUNTER — Ambulatory Visit
Admission: RE | Admit: 2020-02-03 | Discharge: 2020-02-03 | Disposition: A | Discharge status: Home / Self Care | Payer: Medicare Other | Source: Ambulatory Visit | Attending: Radiation Oncology | Admitting: Radiation Oncology

## 2020-02-03 ENCOUNTER — Other Ambulatory Visit: Payer: Self-pay

## 2020-02-03 ENCOUNTER — Encounter: Payer: Self-pay | Admitting: Radiation Oncology

## 2020-02-03 ENCOUNTER — Telehealth: Payer: Self-pay

## 2020-02-03 DIAGNOSIS — C7951 Secondary malignant neoplasm of bone: Secondary | ICD-10-CM

## 2020-02-03 NOTE — Telephone Encounter (Signed)
Called to remind of appointment which is a telephone encounter on 02/03/20 meaningful use completed patient verbalized she understood

## 2020-02-05 NOTE — Progress Notes (Signed)
Radiation Oncology         [phone] ________________________________  Outpatient Follow Up - Conducted via telephone due to current COVID-19 concerns for limiting patient exposure  I spoke with the patient to conduct this visit via telephone to spare the patient unnecessary potential exposure in the healthcare setting during the current COVID-19 pandemic. The patient was notified in advance and was offered a [place] meeting to allow for face to face communication but unfortunately reported that they did not have the appropriate resources/technology to support such a visit and instead preferred to proceed with a telephone visit.  ________________________________  Name: Jessica Glenn MRN: [id number]  Date of Service: 02/03/2020  DOB: [birth year]  Follow Up Note  CC: Maury Dus, MD  Maury Dus, MD  Diagnosis: Progressive metastatic stage II-III, T3 N0 M1 grade 1 ER/PR Positive invasive ductal carcinoma of the left breast with a T2 lesion  Interval Since Last Radiation: 1 year, 7 months  07/05/2018 Stereotactic Radiosurgery: T2 Spine // 18 Gy in 1 fraction  05/01/2018 - 06/15/2018: The patient initially received a dose of 50.4 Gy in 28 fractions to the left chest wall and supraclavicular region. This was delivered using a 3-D conformal, 4 field technique. The patient then received a boost to the mastectomy scar. This delivered an additional 10 Gy in 5 fractions using an en face electron field. The total dose was 60.4 Gy.  Narrative: In summary the patient was diagnosed with stage II or stage III breast disease initially, and on staging imaging was found to have a lytic appearing lesion at T2.  She went on to proceed with mastectomy of the left breast followed by postmastectomy radiation to the left chest wall and regional nodes.  She then went on to receive stereotactic radiosurgery to the T-spine and one fraction.  She is followed in surveillance and she has been NED. She had a complete  MRI on 01/29/20 that did not show new disease, but stable post treatment changes in the thoracic spine. She's contacted by phone to discuss these findings.               On review of systems, the patient reports that she is doing well overall. She denies any chest pain, shortness of breath, cough, fevers, chills, night sweats, unintended weight changes. She denies any bowel or bladder disturbances, and denies abdominal pain, nausea or vomiting. She denies any new musculoskeletal or joint aches or pains, new skin lesions or concerns. A complete review of systems is obtained and is otherwise negative.   Past Medical History:  Past Medical History:  Diagnosis Date  . Basal cell carcinoma    s/p Moh's surgery  to left side of nose  . Breast cancer, left breast ([place]) dx'd 05/2017  . History of tobacco use   . Varicose vein of leg    BLE    Past Surgical History: Past Surgical History:  Procedure Laterality Date  . BREAST BIOPSY Left 05/2017  . CATARACT EXTRACTION W/ INTRAOCULAR LENS  IMPLANT, BILATERAL Bilateral   . DILATION AND CURETTAGE OF UTERUS    . FRACTURE SURGERY    . HARDWARE REMOVAL Right 02/14/2017   Procedure: RIGHT ANKLE HARDWARE REMOVAL;  Surgeon: [name], MD;  Location: [place];  Service: Orthopedics;  Laterality: Right;  . I & D EXTREMITY Right 02/14/2017   Procedure: RIGHT IRRIGATION AND DEBRIDEMENT ANKLE;  Surgeon: [name], MD;  Location: [place];  Service: Orthopedics;  Laterality: Right;  . MASTECTOMY Left 02/14/2018   LEFT MASTECTOMY WITH DEEP LEFT AXILLARY SENTINEL LYMPH NODE BIOPSY   . MASTECTOMY W/ SENTINEL NODE BIOPSY Left 02/14/2018   Procedure: LEFT MASTECTOMY WITH SENTINEL LYMPH NODE BIOPSY AND POSSIBLE NODE DISSECTION;  Surgeon: Jovita Kussmaul, MD;  Location: [place];  Service: General;  Laterality: Left;  . MOHS SURGERY Left    "side of nose"  . ORIF ANKLE FRACTURE Right 02/02/2016   Procedure: OPEN REDUCTION  INTERNAL FIXATION (ORIF) ANKLE FRACTURE;  Surgeon: [name], MD;  Location: [place];  Service: Orthopedics;  Laterality: Right;  strykermini c armreg bedbone foam    Social History:  Social History   Socioeconomic History  . Marital status: Married    Spouse name: Not on file  . Number of children: Not on file  . Years of education: Not on file  . Highest education level: Not on file  Occupational History  . Not on file  Tobacco Use  . Smoking status: Former Smoker    Packs/day: 1.00    Years: 8.00    Pack years: 8.00    Types: Cigarettes    Quit date: 1960    Years since quitting: 61.3  . Smokeless tobacco: Never Used  Substance and Sexual Activity  . Alcohol use: Yes    Alcohol/week: 5.0 standard drinks    Types: 5 Glasses of wine per week  . Drug use: No  . Sexual activity: Not Currently  Other Topics Concern  . Not on file  Social History Narrative  . Not on file   Social Determinants of Health   Financial Resource Strain:   . Difficulty of Paying Living Expenses:   Food Insecurity:   . Worried About Charity fundraiser in the Last Year:   . Arboriculturist in the Last Year:   Transportation Needs:   . Film/video editor (Medical):   Marland Kitchen Lack of Transportation (Non-Medical):   Physical Activity:   . Days of Exercise per Week:   . Minutes of Exercise per Session:   Stress:   . Feeling of Stress :   Social Connections:   . Frequency of Communication with Friends and Family:   . Frequency of Social Gatherings with Friends and Family:   . Attends Religious Services:   . Active Member of Clubs or Organizations:   . Attends Archivist Meetings:   Marland Kitchen Marital Status:   Intimate Partner Violence:   . Fear of Current or Ex-Partner:   . Emotionally Abused:   Marland Kitchen Physically Abused:   . Sexually Abused:   The patient is married.  She has 4 sons.  Her husband is a retired Theme park manager and they live in [place].  Family  History: Family History  Problem Relation Age of Onset  . COPD Mother   . Emphysema Mother      ALLERGIES:  has No Known Allergies.  Meds: Current Outpatient Medications  Medication Sig Dispense Refill  . anastrozole (ARIMIDEX) 1 MG tablet Take 1 tablet (1 mg total) by mouth daily. 90 tablet 4  . ascorbic acid (VITAMIN C) 1000 MG tablet Take 1,000 mg by mouth daily.    . Calcium Carb-Cholecalciferol (CALCIUM 600 + D PO) Take 1 tablet by mouth daily.    . Cholecalciferol (VITAMIN D3) 3000 units TABS Take 3,000 Units by mouth daily.    . Cyanocobalamin (B-12) 5000 MCG CAPS Take 5,000 mcg by mouth daily.    Marland Kitchen  diphenhydramine-acetaminophen (TYLENOL PM) 25-500 MG TABS tablet Take 1-2 tablets by mouth at bedtime.     Marland Kitchen doxylamine, Sleep, (UNISOM) 25 MG tablet Take 25 mg by mouth at bedtime as needed for sleep.    . magnesium oxide (MAG-OX) 400 (241.3 Mg) MG tablet Take 1 tablet (400 mg total) by mouth daily.    . mirtazapine (REMERON) 15 MG tablet Take 0.5 tablets (7.5 mg total) by mouth at bedtime.    . Multiple Vitamin (MULTIVITAMIN WITH MINERALS) TABS tablet Take 1 tablet by mouth daily.    Marland Kitchen thiamine (VITAMIN B-1) 50 MG tablet Take 1 tablet (50 mg total) by mouth daily.     No current facility-administered medications for this encounter.    Physical Findings: Unable to assess due to nature of encounter   Lab Findings: Lab Results  Component Value Date   WBC 6.0 01/22/2020   HGB 14.2 01/22/2020   HCT 44.5 01/22/2020   MCV 92.9 01/22/2020   PLT 164 01/22/2020     Radiographic Findings: MR TOTAL SPINE METS SCREENING  Result Date: 01/29/2020 CLINICAL DATA:  86 year old female with history of metastatic breast cancer, known spine metastases with history of stereotactic radio surgery to T2. EXAM: MRI TOTAL SPINE WITHOUT AND WITH CONTRAST TECHNIQUE: Multisequence MR imaging of the spine from the cervical spine to the sacrum was performed prior to and following IV contrast  administration for evaluation of spinal metastatic disease. CONTRAST:  7.36mL GADAVIST GADOBUTROL 1 MMOL/ML IV SOLN COMPARISON:  None. FINDINGS: MRI CERVICAL SPINE FINDINGS Alignment: Straightening of the normal cervical spine. No listhesis. Vertebrae: Vertebral body height maintained without acute or interval fracture. Bone marrow signal intensity within normal limits. Benign hemangioma involving the C5 vertebral body noted, unchanged. No new suspicious osseous lesions. Cord: Signal intensity within the cervical spinal cord is normal. Posterior Fossa, vertebral arteries, paraspinal tissues: Visualized brain and posterior fossa within normal limits. Mucosal thickening noted within the partially visualized paranasal sinuses. Craniocervical junction normal. Paraspinous and prevertebral soft tissues within normal limits. Normal flow voids seen within the partially visualized vertebral arteries. Disc levels: Multilevel cervical spondylosis with degenerative disc desiccation and facet hypertrophy seen within the cervical spine, incompletely evaluated on these limited sagittal only views. Degenerative disc osteophyte at C5-6 with mild effacement of the ventral thecal sac and probable mild spinal stenosis without cord impingement or significant deformity. No high-grade spinal stenosis. MRI THORACIC SPINE FINDINGS Alignment: Vertebral bodies normally aligned with preservation of the normal thoracic kyphosis. No listhesis. Vertebrae: Treated osseous metastasis involving the T2 vertebral body is stable in size and appearance measuring 12 x 10 x 18 mm (craniocaudad by AP by transverse), series 25, image 8). Mild height loss at the adjacent superior endplate of T2 is unchanged. No extra osseous extension of tumor or epidural tumor identified. 7 mm enhancing lesion at the left posterior aspect of the T8 vertebral body is also unchanged (series 25, image 5). Patchy STIR signal intensity with enhancement involving the right pedicle  of T7 is unchanged and remains indeterminate (series 25, image 10). No other new thoracic metastases identified. Cord: Signal intensity within the visualized thoracic spinal cord is normal. Paraspinal and other soft tissues: Visualized paraspinal soft tissues within normal limits. Partially visualized visceral structures are normal. Disc levels: Mild multilevel thoracic degenerative disc disease without high-grade stenosis, relatively unchanged from previous. Discogenic reactive endplate changes with associated mild enhancement seen at a few levels, most notable at T9-10 and T12-L1. Multilevel facet hypertrophy noted within the lower  lumbar spine. MRI LUMBAR SPINE FINDINGS Segmentation:  Standard. Alignment: Mild S-shaped lumbar scoliosis. No significant listhesis. Vertebrae: Vertebral body height maintained without acute or interval fracture. No new suspicious osseous lesions. Multilevel discogenic reactive endplate changes with endplate Schmorl's node deformities noted throughout the lumbar spine. Conus medullaris: Extends to the L1-2 level and appears normal. Paraspinal and other soft tissues: Visualized paraspinous soft tissues grossly within normal limits on this limited study. Disc levels: Moderate multilevel degenerative intervertebral disc space narrowing with disc bulging seen throughout the lumbar spine. Moderate multilevel facet hypertrophy, moderate to severe in nature at L3-4 on the right where there is associated reactive marrow edema (series 22, image 10). No significant spinal stenosis. Moderate foraminal narrowing noted on the left at L2-3, and on the right at L4-5 and L5-S1. IMPRESSION: 1. Unchanged size of treated T2 vertebral body metastasis. No extra osseous or epidural tumor. 2. Subcentimeter indeterminate lesions at the right pedicle of T7 and posterior T8 vertebral body, also unchanged. Continued attention at follow-up recommended. 3. No evidence for new metastatic disease within the spine.  4. Superimposed multilevel degenerative spondylosis and facet arthrosis without high-grade spinal stenosis. Electronically Signed   By: Jeannine Boga M.D.   On: 01/29/2020 14:54    Impression/Plan: 1. Progressive Metastatic Stage II-III [id number], grade1, ER/PR positive invasive ductal carcinoma of the left breast with disease to the thoracic spine.The patient appears to be NED without new or active diseases in the spine. She will be followed in 4 months time for continued evaluation of the Thoracic spine and annual complete spine imaging. She will also continue to follow-up with Dr. Jana Hakim while taking Arimidex and Zometa.  She is encouraged to call if she has any questions or concerns prior to her next visit.  Given current concerns for patient exposure during the COVID-19 pandemic, this encounter was conducted via telephone.  The patient has given verbal consent for this type of encounter. The time spent during this encounter was 35 minutes and 50% of that time was spent in the coordination of his care. The attendants for this meeting include Shona Simpson, [facility] and [name]  During the encounter, Shona Simpson [facility] was located at home remotely.   [name]  was located at home.     Carola Rhine, PAC

## 2020-04-15 ENCOUNTER — Inpatient Hospital Stay: Payer: Medicare Other | Attending: Adult Health

## 2020-04-15 ENCOUNTER — Encounter: Payer: Self-pay | Admitting: Adult Health

## 2020-04-15 ENCOUNTER — Inpatient Hospital Stay (HOSPITAL_BASED_OUTPATIENT_CLINIC_OR_DEPARTMENT_OTHER): Payer: Medicare Other | Admitting: Adult Health

## 2020-04-15 ENCOUNTER — Other Ambulatory Visit: Payer: Self-pay

## 2020-04-15 ENCOUNTER — Inpatient Hospital Stay: Payer: Medicare Other

## 2020-04-15 VITALS — BP 169/79 | HR 73 | Temp 98.7°F | Resp 18 | Ht 65.0 in | Wt 161.7 lb

## 2020-04-15 DIAGNOSIS — C50812 Malignant neoplasm of overlapping sites of left female breast: Secondary | ICD-10-CM

## 2020-04-15 DIAGNOSIS — Z79811 Long term (current) use of aromatase inhibitors: Secondary | ICD-10-CM | POA: Diagnosis not present

## 2020-04-15 DIAGNOSIS — C7951 Secondary malignant neoplasm of bone: Secondary | ICD-10-CM

## 2020-04-15 DIAGNOSIS — M8580 Other specified disorders of bone density and structure, unspecified site: Secondary | ICD-10-CM | POA: Insufficient documentation

## 2020-04-15 DIAGNOSIS — Z170 Estrogen receptor positive status [ER+]: Secondary | ICD-10-CM

## 2020-04-15 DIAGNOSIS — C50412 Malignant neoplasm of upper-outer quadrant of left female breast: Secondary | ICD-10-CM | POA: Diagnosis present

## 2020-04-15 DIAGNOSIS — C50912 Malignant neoplasm of unspecified site of left female breast: Secondary | ICD-10-CM

## 2020-04-15 LAB — CBC WITH DIFFERENTIAL/PLATELET
Abs Immature Granulocytes: 0.02 10*3/uL (ref 0.00–0.07)
Basophils Absolute: 0 10*3/uL (ref 0.0–0.1)
Basophils Relative: 0 %
Eosinophils Absolute: 0.2 10*3/uL (ref 0.0–0.5)
Eosinophils Relative: 3 %
HCT: 41.7 % (ref 36.0–46.0)
Hemoglobin: 13.9 g/dL (ref 12.0–15.0)
Immature Granulocytes: 0 %
Lymphocytes Relative: 29 %
Lymphs Abs: 1.5 10*3/uL (ref 0.7–4.0)
MCH: 30.3 pg (ref 26.0–34.0)
MCHC: 33.3 g/dL (ref 30.0–36.0)
MCV: 91 fL (ref 80.0–100.0)
Monocytes Absolute: 0.5 10*3/uL (ref 0.1–1.0)
Monocytes Relative: 10 %
Neutro Abs: 3 10*3/uL (ref 1.7–7.7)
Neutrophils Relative %: 58 %
Platelets: 160 10*3/uL (ref 150–400)
RBC: 4.58 MIL/uL (ref 3.87–5.11)
RDW: 12.2 % (ref 11.5–15.5)
WBC: 5.1 10*3/uL (ref 4.0–10.5)
nRBC: 0 % (ref 0.0–0.2)

## 2020-04-15 LAB — COMPREHENSIVE METABOLIC PANEL
ALT: 7 U/L (ref 0–44)
AST: 16 U/L (ref 15–41)
Albumin: 4.1 g/dL (ref 3.5–5.0)
Alkaline Phosphatase: 54 U/L (ref 38–126)
Anion gap: 10 (ref 5–15)
BUN: 19 mg/dL (ref 8–23)
CO2: 23 mmol/L (ref 22–32)
Calcium: 9.9 mg/dL (ref 8.9–10.3)
Chloride: 107 mmol/L (ref 98–111)
Creatinine, Ser: 1.01 mg/dL — ABNORMAL HIGH (ref 0.44–1.00)
GFR calc Af Amer: 57 mL/min — ABNORMAL LOW (ref >60)
GFR calc non Af Amer: 49 mL/min — ABNORMAL LOW (ref >60)
Glucose, Bld: 111 mg/dL — ABNORMAL HIGH (ref 70–99)
Potassium: 4.2 mmol/L (ref 3.5–5.1)
Sodium: 140 mmol/L (ref 135–145)
Total Bilirubin: 0.6 mg/dL (ref 0.3–1.2)
Total Protein: 7.4 g/dL (ref 6.5–8.1)

## 2020-04-15 MED ORDER — ZOLEDRONIC ACID 4 MG/100ML IV SOLN
INTRAVENOUS | Status: AC
  Filled 2020-04-15: qty 100

## 2020-04-15 MED ORDER — ZOLEDRONIC ACID 4 MG/100ML IV SOLN
4.0000 mg | Freq: Once | INTRAVENOUS | Status: AC
  Administered 2020-04-15: 4 mg via INTRAVENOUS

## 2020-04-15 MED ORDER — SODIUM CHLORIDE 0.9 % IV SOLN
INTRAVENOUS | Status: DC
  Filled 2020-04-15: qty 250

## 2020-04-15 MED ORDER — ZOLEDRONIC ACID 4 MG/5ML IV CONC: 4.0000 mg | Freq: Once | INTRAVENOUS | Status: DC

## 2020-04-15 NOTE — Patient Instructions (Signed)
Zoledronic Acid injection (Hypercalcemia, Oncology) What is this medicine? ZOLEDRONIC ACID (ZOE le dron ik AS id) lowers the amount of calcium loss from bone. It is used to treat too much calcium in your blood from cancer. It is also used to prevent complications of cancer that has spread to the bone. This medicine may be used for other purposes; ask your health care provider or pharmacist if you have questions. COMMON BRAND NAME(S): Zometa What should I tell my health care provider before I take this medicine? They need to know if you have any of these conditions:  aspirin-sensitive asthma  cancer, especially if you are receiving medicines used to treat cancer  dental disease or wear dentures  infection  kidney disease  receiving corticosteroids like dexamethasone or prednisone  an unusual or allergic reaction to zoledronic acid, other medicines, foods, dyes, or preservatives  pregnant or trying to get pregnant  breast-feeding How should I use this medicine? This medicine is for infusion into a vein. It is given by a health care professional in a hospital or clinic setting. Talk to your pediatrician regarding the use of this medicine in children. Special care may be needed. Overdosage: If you think you have taken too much of this medicine contact a poison control center or emergency room at once. NOTE: This medicine is only for you. Do not share this medicine with others. What if I miss a dose? It is important not to miss your dose. Call your doctor or health care professional if you are unable to keep an appointment. What may interact with this medicine?  certain antibiotics given by injection  NSAIDs, medicines for pain and inflammation, like ibuprofen or naproxen  some diuretics like bumetanide, furosemide  teriparatide  thalidomide This list may not describe all possible interactions. Give your health care provider a list of all the medicines, herbs, non-prescription  drugs, or dietary supplements you use. Also tell them if you smoke, drink alcohol, or use illegal drugs. Some items may interact with your medicine. What should I watch for while using this medicine? Visit your doctor or health care professional for regular checkups. It may be some time before you see the benefit from this medicine. Do not stop taking your medicine unless your doctor tells you to. Your doctor may order blood tests or other tests to see how you are doing. Women should inform their doctor if they wish to become pregnant or think they might be pregnant. There is a potential for serious side effects to an unborn child. Talk to your health care professional or pharmacist for more information. You should make sure that you get enough calcium and vitamin D while you are taking this medicine. Discuss the foods you eat and the vitamins you take with your health care professional. Some people who take this medicine have severe bone, joint, and/or muscle pain. This medicine may also increase your risk for jaw problems or a broken thigh bone. Tell your doctor right away if you have severe pain in your jaw, bones, joints, or muscles. Tell your doctor if you have any pain that does not go away or that gets worse. Tell your dentist and dental surgeon that you are taking this medicine. You should not have major dental surgery while on this medicine. See your dentist to have a dental exam and fix any dental problems before starting this medicine. Take good care of your teeth while on this medicine. Make sure you see your dentist for regular follow-up   appointments. What side effects may I notice from receiving this medicine? Side effects that you should report to your doctor or health care professional as soon as possible:  allergic reactions like skin rash, itching or hives, swelling of the face, lips, or tongue  anxiety, confusion, or depression  breathing problems  changes in vision  eye  pain  feeling faint or lightheaded, falls  jaw pain, especially after dental work  mouth sores  muscle cramps, stiffness, or weakness  redness, blistering, peeling or loosening of the skin, including inside the mouth  trouble passing urine or change in the amount of urine Side effects that usually do not require medical attention (report to your doctor or health care professional if they continue or are bothersome):  bone, joint, or muscle pain  constipation  diarrhea  fever  hair loss  irritation at site where injected  loss of appetite  nausea, vomiting  stomach upset  trouble sleeping  trouble swallowing  weak or tired This list may not describe all possible side effects. Call your doctor for medical advice about side effects. You may report side effects to FDA at 1-800-FDA-1088. Where should I keep my medicine? This drug is given in a hospital or clinic and will not be stored at home. NOTE: This sheet is a summary. It may not cover all possible information. If you have questions about this medicine, talk to your doctor, pharmacist, or health care provider.  2020 Elsevier/Gold Standard (2014-03-15 14:19:39)  

## 2020-04-15 NOTE — Progress Notes (Signed)
Jessica Glenn  Telephone:([phone]) [phone] Fax:(336) [phone]     ID: Jessica Glenn DOB: 10-18-1929  MR#: [id number]  UYQ#:[id number]  Patient Care Team: Maury Dus, MD as PCP - General (Family Medicine) Magrinat, Virgie Dad, MD as Consulting Physician (Oncology) Jovita Kussmaul, MD as Consulting Physician (General Surgery) Kyung Rudd, MD as Consulting Physician (Radiation Oncology) OTHER MD:  CHIEF COMPLAINT: Locally advanced/metastatic estrogen receptor positive left breast cancer  CURRENT TREATMENT: Anastrozole; zoledronate   INTERVAL HISTORY: Jessica Glenn returns today for follow-up and treatment of her metastatic breast cancer.   She continues on anastrozole.  She has had absolutely no side effects from this medication that she is aware of.    She also received zolendronate every 3 months and tolerates it well.  She knows to take two tums the day of her infusion.  Since her last visit, she underwent bone density screening on 10/10/2019. This showed a T-score of -2.0, which is considered osteopenic.  She underwent total spine mets screening MRI on 01/29/2020.  She is scheduled for bilateral breast mammogram on Friday, 04/17/2020.   REVIEW OF SYSTEMS: Jessica Glenn reports feeling well overall.  She notes that she has no new pain, and tolerates her treatments without difficulty.  She denies any new issues such as fever, chills, chest pain, palpitations, cough, shortness of breath, bowel/bladder issues, nausea, vomiting, headaches, vision issues, or any other concerns.  A detailed ROS was otherwise non contributory.  She is active with walking, and notes that she plans and needs to walk more often.     HISTORY OF CURRENT ILLNESS: From the original intake note:  Jessica Glenn tells me she first noted a changes in her left breast about a year and a half ago. She had had some dental work around that time and thought possibly that could be related. She said her breast felt "like steel". She did  not bring it to medical attention until her recent appointment with Dr. Alyson Glenn and he set her up for bilateral diagnostic mammography with tomography and left breast ultrasonography at the [facility] 06/19/2017. This found the breast density to be category C. In the upper outer retroareolar left breast there was an irregular mass estimated to be at least 6 cm by mammography. There were heterogeneous calcifications within the mass. There was skin thickening of the areola and an enlarged inferior left axillary lymph node was noted. On exam there was a large fixed hard mass in the upper outer quadrant of the left breast causing protrusion of the areola. It measured approximately 7 cm by palpation and there was visible skin thickening. In the inferior left axilla there was a firm palpable mass measuring 2 cm.  Ultrasound confirmed an irregular hypoechoic vascular mass in the left breast centered on the 1:00 position 4 cm from the nipple measuring 6.7 cm. This was abutting the overlying skin. It was a 2.3 cm hypoechoic vascular mass in the left axilla. The right breast was unremarkable.  On 06/22/2017 the patient had left breast and left axillary node biopsy, showing ([id number]) both sites to be involved by invasive ductal carcinoma, grade 2, with prognostic panels on both biopsies showing estrogen receptor 100% positivity, progesterone receptor 40-50% positivity, all with strong staining intensity, MIB-1:30 percent in the breast and 15% in the lymph nodes, and both HER-2 negative, with ratios of 0.76-0.85 and number per cell 1.10-1.30.  The patient's subsequent history is as detailed below.   PAST MEDICAL HISTORY: Past Medical History:  Diagnosis Date  .  Basal cell carcinoma    s/p Moh's surgery  to left side of nose  . Breast cancer, left breast ([place]) dx'd 05/2017  . History of tobacco use   . Varicose vein of leg    BLE    PAST SURGICAL HISTORY: Past Surgical History:  Procedure Laterality  Date  . BREAST BIOPSY Left 05/2017  . CATARACT EXTRACTION W/ INTRAOCULAR LENS  IMPLANT, BILATERAL Bilateral   . DILATION AND CURETTAGE OF UTERUS    . FRACTURE SURGERY    . HARDWARE REMOVAL Right 02/14/2017   Procedure: RIGHT ANKLE HARDWARE REMOVAL;  Surgeon: [name], MD;  Location: [facility];  Service: Orthopedics;  Laterality: Right;  . I & D EXTREMITY Right 02/14/2017   Procedure: RIGHT IRRIGATION AND DEBRIDEMENT ANKLE;  Surgeon: [name], MD;  Location: [facility];  Service: Orthopedics;  Laterality: Right;  . MASTECTOMY Left 02/14/2018   LEFT MASTECTOMY WITH DEEP LEFT AXILLARY SENTINEL LYMPH NODE BIOPSY   . MASTECTOMY W/ SENTINEL NODE BIOPSY Left 02/14/2018   Procedure: LEFT MASTECTOMY WITH SENTINEL LYMPH NODE BIOPSY AND POSSIBLE NODE DISSECTION;  Surgeon: Jovita Kussmaul, MD;  Location: [facility];  Service: General;  Laterality: Left;  . MOHS SURGERY Left    "side of nose"  . ORIF ANKLE FRACTURE Right 02/02/2016   Procedure: OPEN REDUCTION INTERNAL FIXATION (ORIF) ANKLE FRACTURE;  Surgeon: [name], MD;  Location: [facility];  Service: Orthopedics;  Laterality: Right;  strykermini c armreg bedbone foam    FAMILY HISTORY Family History  Problem Relation Age of Onset  . COPD Mother   . Emphysema Mother   The patient's father had a history of alcoholism and a band in the family. The patient has no information on the paternal side. The patient's mother died from emphysema at the age of 37. The patient has one sister, 2 brothers. There is no history of breast or ovarian cancer in the family to her knowledge.   GYNECOLOGIC HISTORY:  No LMP recorded. Patient is postmenopausal. Menarche age 25, first live birth age 51, the patient is Jessica Glenn P4. She went through the change of life in her 19s. She took hormone replacement for a few years.    SOCIAL HISTORY:  The patient is originally from [place]. Her husband Jessica Glenn is a traveling  Theme park manager, nondenominational. Son Jessica Glenn lives in [place] where he works as a Theme park manager; son Jessica Glenn lives in [place] where he works as an Optometrist; son Jessica Glenn lives in [place] where he works as a Chief Strategy Officer; son Rodman Key also lives in [place] and works as a Chief Strategy Officer. The patient has 14 grandchildren and 59 great-grandchildren    ADVANCED DIRECTIVES: In place   HEALTH MAINTENANCE: Social History   Tobacco Use  . Smoking status: Former Smoker    Packs/day: 1.00    Years: 8.00    Pack years: 8.00    Types: Cigarettes    Quit date: 1960    Years since quitting: 61.4  . Smokeless tobacco: Never Used  Vaping Use  . Vaping Use: Never used  Substance Use Topics  . Alcohol use: Yes    Alcohol/week: 5.0 standard drinks    Types: 5 Glasses of wine per week  . Drug use: No     Colonoscopy: Never  PAP:  Bone density: Osteopenia   No Known Allergies  Current Outpatient Medications  Medication Sig Dispense Refill  . anastrozole (ARIMIDEX) 1 MG tablet Take 1 tablet (1 mg total) by  mouth daily. 90 tablet 4  . ascorbic acid (VITAMIN C) 1000 MG tablet Take 1,000 mg by mouth daily.    . Calcium Carb-Cholecalciferol (CALCIUM 600 + D PO) Take 1 tablet by mouth daily.    . Cholecalciferol (VITAMIN D3) 3000 units TABS Take 3,000 Units by mouth daily.    . Cyanocobalamin (B-12) 5000 MCG CAPS Take 5,000 mcg by mouth daily.    . diphenhydramine-acetaminophen (TYLENOL PM) 25-500 MG TABS tablet Take 1-2 tablets by mouth at bedtime.     Marland Kitchen doxylamine, Sleep, (UNISOM) 25 MG tablet Take 25 mg by mouth at bedtime as needed for sleep.    . magnesium oxide (MAG-OX) 400 (241.3 Mg) MG tablet Take 1 tablet (400 mg total) by mouth daily.    . mirtazapine (REMERON) 15 MG tablet Take 0.5 tablets (7.5 mg total) by mouth at bedtime.    . Multiple Vitamin (MULTIVITAMIN WITH MINERALS) TABS tablet Take 1 tablet by mouth daily.    Marland Kitchen thiamine (VITAMIN B-1) 50 MG tablet Take 1 tablet (50 mg total) by mouth daily.      No current facility-administered medications for this visit.    OBJECTIVE: White woman in no acute distress  Vitals:   04/15/20 0901  BP: (!) 169/79  Pulse: 73  Resp: 18  Temp: 98.7 F (37.1 C)  SpO2: 97%   Wt Readings from Last 3 Encounters:  04/15/20 161 lb 11.2 oz (73.3 kg)  01/22/20 163 lb 4.8 oz (74.1 kg)  07/22/19 150 lb 1.6 oz (68.1 kg)   Body mass index is 26.91 kg/m.    ECOG FS:1 - Symptomatic but completely ambulatory GENERAL: Patient is a well appearing female in no acute distress HEENT:  Sclerae anicteric.  Mask in place  Neck is supple.  NODES:  No cervical, supraclavicular, or axillary lymphadenopathy palpated.  BREAST EXAM:  Deferred. LUNGS:  Clear to auscultation bilaterally.  No wheezes or rhonchi. HEART:  Regular rate and rhythm. No murmur appreciated. ABDOMEN:  Soft, nontender.  Positive, normoactive bowel sounds. No organomegaly palpated. MSK:  No focal spinal tenderness to palpation. Full range of motion bilaterally in the upper extremities. EXTREMITIES:  No peripheral edema.   SKIN:  Clear with no obvious rashes or skin changes. No nail dyscrasia. NEURO:  Nonfocal. Well oriented.  Appropriate affect.   LAB RESULTS:  CMP     Component Value Date/Time   NA 140 04/15/2020 0846   NA 140 10/27/2017 0908   K 4.2 04/15/2020 0846   K 4.8 10/27/2017 0908   CL 107 04/15/2020 0846   CO2 23 04/15/2020 0846   CO2 27 10/27/2017 0908   GLUCOSE 111 (H) 04/15/2020 0846   GLUCOSE 96 10/27/2017 0908   BUN 19 04/15/2020 0846   BUN 20.5 10/27/2017 0908   CREATININE 1.01 (H) 04/15/2020 0846   CREATININE 0.9 10/27/2017 0908   CALCIUM 9.9 04/15/2020 0846   CALCIUM 9.7 10/27/2017 0908   PROT 7.4 04/15/2020 0846   PROT 7.2 10/27/2017 0908   ALBUMIN 4.1 04/15/2020 0846   ALBUMIN 4.2 10/27/2017 0908   AST 16 04/15/2020 0846   AST 19 10/27/2017 0908   ALT 7 04/15/2020 0846   ALT 8 10/27/2017 0908   ALKPHOS 54 04/15/2020 0846   ALKPHOS 58 10/27/2017 0908    BILITOT 0.6 04/15/2020 0846   BILITOT 0.62 10/27/2017 0908   GFRNONAA 49 (L) 04/15/2020 0846   GFRAA 57 (L) 04/15/2020 0846    No results found for: TOTALPROTELP, ALBUMINELP, A1GS, A2GS, BETS,  BETA2SER, GAMS, MSPIKE, SPEI  No results found for: KPAFRELGTCHN, LAMBDASER, [facility]  Lab Results  Component Value Date   WBC 5.1 04/15/2020   NEUTROABS 3.0 04/15/2020   HGB 13.9 04/15/2020   HCT 41.7 04/15/2020   MCV 91.0 04/15/2020   PLT 160 04/15/2020   Lab Results  Component Value Date   CA2729 61.6 (H) 07/05/2018    Urinalysis No results found for: COLORURINE, APPEARANCEUR, LABSPEC, PHURINE, GLUCOSEU, HGBUR, BILIRUBINUR, KETONESUR, PROTEINUR, UROBILINOGEN, NITRITE, LEUKOCYTESUR   STUDIES: No results found.   ELIGIBLE FOR AVAILABLE RESEARCH PROTOCOL: no  ASSESSMENT: 86 y.o. [place] woman status post left breast overlapping sites biopsy 06/22/2017 for a clinical T3 N1-2, stage II-III invasive ductal carcinoma, grade 2, estrogen and progesterone receptor positive, HER-2 nonamplified, with an MIB-1 of 30%  (a) staging CT of the chest and bone survey and bone scan July 11, 2017 showed multiple indeterminant subcentimeter pulmonary nodules which will require follow-up, as well as an indeterminate right hepatic lobe lesion, but no definite evidence of malignancy.  (b) CA-27-29 is informative  (1) neo-adjuvant fulvestrant started on 07/05/2017, discontinued 12/20/2017 with possible progression  (2) status post left mastectomy and sentinel lymph node sampling 02/14/2018 for a T3 N1, stage IIA invasive ductal carcinoma, grade 1, with negative margins.  Repeat prognostic panel again estrogen and progesterone receptor positive, HER-2 negative  (a) 2 of 7 removed lymph nodes were positive  (3) adjuvant radiation to follow surgery  (4) anastrozole started 01/17/2018  (a) start palbociclib once disease progression is documented  (5) METASTATIC DISEASE: April 2019  (a)  CT scan of the chest 02/05/2018 shows a new lesion at T2  (b) total spinal MRI 03/14/2018 confirms lesion at T2 and possible minimal lesion at T8, neither amenable to biopsy; there are no other suspicious lesions  (6)  adjuvant radiation 05/01/2018 -06/15/2018  (a) received capecitabine sensitization given the extent of local disease  (b) Site/dose:   The patient initially received a dose of 50.4 Gy in 28 fractions to the left chest wall and supraclavicular region. This was delivered using a 3-D conformal, 4 field technique. The patient then received a boost to the mastectomy scar. This delivered an additional 10 Gy in 5 fractions using an en face electron field. The total dose was 60.4 Gy  (c) stereotactic radiosurgery to T2 given 07/05/2018  (7) started zolendronate 07/11/2018, to be repeated every 12 weeks  (a) bone density 10/10/2019 shows a T score of -2.0   PLAN: Jessica Glenn continues on Anastrozole daily along with Zometa every 3 months for her metastatic breast cancer.  She has no clinical or radiographic sign of cancer progression today.  She will continue her current treatment as she is tolerating it well.  We reviewed the way that the [facility] [place] works, and that it takes the calcium from the blood and puts it into the bone.  She understands that she needs to take extra calcium today, and plans to take two tums.    Jessica Glenn and I talked about her physical activity. I recommended she walk, which she plans on doing.    I talked with Dr. Jana Hakim about her case.  Now that she is over two years out from having metastatic disease, we will lengthen the time between imaging to every 6 months so long as it is stable.  She hasn't undergone CT chest in a couple of years, so we will do MRI total spine and CT chest in 07/2020.  We will see Jessica Glenn back in  3 months for labs, f/u, and her next Zometa infusion.  She knows to call for any questions that may arise between now and her next appointment.  We are happy to see  her sooner if needed.   Total encounter time 30 minutes.Wilber Bihari, NP 04/15/20 9:39 AM Medical Oncology and Hematology [facility] [place], [place] [address] Tel. [phone]    Fax. [phone]    *Total Encounter Time as defined by the Centers for Medicare and Medicaid Services includes, in addition to the face-to-face time of a patient visit (documented in the note above) non-face-to-face time: obtaining and reviewing outside history, ordering and reviewing medications, tests or procedures, care coordination (communications with other health care professionals or caregivers) and documentation in the medical record.

## 2020-04-16 ENCOUNTER — Telehealth: Payer: Self-pay | Admitting: Adult Health

## 2020-04-16 NOTE — Telephone Encounter (Signed)
Added office visit with [place] to appts already scheduled on 12/1. Pt confirmed new appt time.

## 2020-04-17 ENCOUNTER — Ambulatory Visit
Admission: RE | Admit: 2020-04-17 | Discharge: 2020-04-17 | Disposition: A | Discharge status: Home / Self Care | Payer: Medicare Other | Source: Ambulatory Visit | Attending: Oncology | Admitting: Oncology

## 2020-04-17 ENCOUNTER — Other Ambulatory Visit: Payer: Self-pay

## 2020-04-17 DIAGNOSIS — C50412 Malignant neoplasm of upper-outer quadrant of left female breast: Secondary | ICD-10-CM

## 2020-04-17 DIAGNOSIS — Z170 Estrogen receptor positive status [ER+]: Secondary | ICD-10-CM

## 2020-04-22 ENCOUNTER — Other Ambulatory Visit: Payer: Self-pay | Admitting: *Deleted

## 2020-04-22 ENCOUNTER — Telehealth: Payer: Self-pay

## 2020-04-22 NOTE — Telephone Encounter (Signed)
Called pt and LVM informing her that she has appt for 07/02/20 at 0845 at [facility] radiology for CT chest and should have liquids only 4 hours prior to appt, then she will have MRI spine at 1000. Advised pt to call [phone] with any questions/concerns.

## 2020-04-24 ENCOUNTER — Other Ambulatory Visit: Payer: Self-pay | Admitting: *Deleted

## 2020-06-15 ENCOUNTER — Ambulatory Visit: Payer: Medicare Other | Admitting: Dermatology

## 2020-06-15 ENCOUNTER — Other Ambulatory Visit: Payer: Self-pay

## 2020-06-15 DIAGNOSIS — L578 Other skin changes due to chronic exposure to nonionizing radiation: Secondary | ICD-10-CM

## 2020-06-15 DIAGNOSIS — Z853 Personal history of malignant neoplasm of breast: Secondary | ICD-10-CM

## 2020-06-15 DIAGNOSIS — L820 Inflamed seborrheic keratosis: Secondary | ICD-10-CM | POA: Diagnosis not present

## 2020-06-15 DIAGNOSIS — D229 Melanocytic nevi, unspecified: Secondary | ICD-10-CM | POA: Diagnosis not present

## 2020-06-15 DIAGNOSIS — Z1283 Encounter for screening for malignant neoplasm of skin: Secondary | ICD-10-CM

## 2020-06-15 DIAGNOSIS — Z85828 Personal history of other malignant neoplasm of skin: Secondary | ICD-10-CM | POA: Diagnosis not present

## 2020-06-15 DIAGNOSIS — L821 Other seborrheic keratosis: Secondary | ICD-10-CM

## 2020-06-15 DIAGNOSIS — D1800 Hemangioma unspecified site: Secondary | ICD-10-CM

## 2020-06-15 DIAGNOSIS — L814 Other melanin hyperpigmentation: Secondary | ICD-10-CM

## 2020-06-15 NOTE — Progress Notes (Signed)
   Follow-Up Visit   Subjective  Jessica Glenn is a 86 y.o. female who presents for the following: Annual Exam (History of BCC and SCC - TBSE today). The patient presents for Total-Body Skin Exam (TBSE) for skin cancer screening and mole check.  The following portions of the chart were reviewed this encounter and updated as appropriate:  Tobacco  Allergies  Meds  Problems  Med Hx  Surg Hx  Fam Hx     Review of Systems:  No other skin or systemic complaints except as noted in HPI or Assessment and Plan.  Objective  Well appearing patient in no apparent distress; mood and affect are within normal limits.  A full examination was performed including scalp, head, eyes, ears, nose, lips, neck, chest, axillae, abdomen, back, buttocks, bilateral upper extremities, bilateral lower extremities, hands, feet, fingers, toes, fingernails, and toenails. All findings within normal limits unless otherwise noted below.  Objective  Left nose: Well healed scar with no evidence of recurrence.   Objective  Left pretibial: Well healed SCC site  Objective  Left Breast: Well healed mastectomy scar  Objective  Right post waistline: Erythematous keratotic or waxy stuck-on papule or plaque.    Assessment & Plan    Lentigines - Scattered tan macules - Discussed due to sun exposure - Benign, observe - Call for any changes  Seborrheic Keratoses - Stuck-on, waxy, tan-brown papules and plaques  - Discussed benign etiology and prognosis. - Observe - Call for any changes  Melanocytic Nevi - Tan-brown and/or pink-flesh-colored symmetric macules and papules - Benign appearing on exam today - Observation - Call clinic for new or changing moles - Recommend daily use of broad spectrum spf 30+ sunscreen to sun-exposed areas.   Hemangiomas - Red papules - Discussed benign nature - Observe - Call for any changes  Actinic Damage - diffuse scaly erythematous macules with underlying  dyspigmentation - Recommend daily broad spectrum sunscreen SPF 30+ to sun-exposed areas, reapply every 2 hours as needed.  - Call for new or changing lesions.  Skin cancer screening performed today.  History of basal cell carcinoma (BCC) Left nose  Clear. Observe for recurrence. Call clinic for new or changing lesions.  Recommend regular skin exams, daily broad-spectrum spf 30+ sunscreen use, and photoprotection.     History of SCC (squamous cell carcinoma) of skin Left pretibial  Clear. Observe for recurrence. Call clinic for new or changing lesions.  Recommend regular skin exams, daily broad-spectrum spf 30+ sunscreen use, and photoprotection.     History of breast cancer Left Breast  Clear today.  Inflamed seborrheic keratosis Right post waistline  Recheck on follow up  Destruction of lesion - Right post waistline Complexity: simple   Destruction method: cryotherapy   Informed consent: discussed and consent obtained   Timeout:  patient name, date of birth, surgical site, and procedure verified Lesion destroyed using liquid nitrogen: Yes   Region frozen until ice ball extended beyond lesion: Yes   Outcome: patient tolerated procedure well with no complications   Post-procedure details: wound care instructions given    Skin cancer screening  Return in about 3 months (around 09/15/2020) for ISK follow up.  I, Ashok Cordia, CMA, am acting as scribe for Sarina Ser, MD .  Documentation: I have reviewed the above documentation for accuracy and completeness, and I agree with the above.  Sarina Ser, MD

## 2020-06-15 NOTE — Patient Instructions (Signed)

## 2020-06-18 ENCOUNTER — Encounter: Payer: Self-pay | Admitting: Dermatology

## 2020-06-20 ENCOUNTER — Other Ambulatory Visit: Payer: Self-pay | Admitting: Oncology

## 2020-06-22 ENCOUNTER — Telehealth: Payer: Self-pay | Admitting: Oncology

## 2020-06-22 NOTE — Telephone Encounter (Signed)
R/s apt per 8/21 sch msg - left message for patient with appt ate and time

## 2020-07-02 ENCOUNTER — Other Ambulatory Visit: Payer: Self-pay

## 2020-07-02 ENCOUNTER — Other Ambulatory Visit: Payer: Self-pay | Admitting: Oncology

## 2020-07-02 ENCOUNTER — Ambulatory Visit (HOSPITAL_COMMUNITY)
Admission: RE | Admit: 2020-07-02 | Discharge: 2020-07-02 | Disposition: A | Discharge status: Home / Self Care | Payer: Medicare Other | Source: Ambulatory Visit | Attending: Adult Health | Admitting: Adult Health

## 2020-07-02 DIAGNOSIS — C7951 Secondary malignant neoplasm of bone: Secondary | ICD-10-CM

## 2020-07-02 DIAGNOSIS — C50412 Malignant neoplasm of upper-outer quadrant of left female breast: Secondary | ICD-10-CM | POA: Diagnosis present

## 2020-07-02 LAB — POCT I-STAT CREATININE: Creatinine, Ser: 0.8 mg/dL (ref 0.44–1.00)

## 2020-07-02 MED ORDER — IOHEXOL 300 MG/ML  SOLN
75.0000 mL | Freq: Once | INTRAMUSCULAR | Status: AC | PRN
  Administered 2020-07-02: 75 mL via INTRAVENOUS

## 2020-07-02 MED ORDER — GADOBUTROL 1 MMOL/ML IV SOLN
7.0000 mL | Freq: Once | INTRAVENOUS | Status: AC | PRN
  Administered 2020-07-02: 7 mL via INTRAVENOUS

## 2020-07-08 ENCOUNTER — Ambulatory Visit: Payer: Medicare Other | Admitting: Oncology

## 2020-07-08 ENCOUNTER — Other Ambulatory Visit: Payer: Medicare Other

## 2020-07-08 ENCOUNTER — Ambulatory Visit: Payer: Medicare Other

## 2020-07-13 ENCOUNTER — Other Ambulatory Visit: Payer: Self-pay | Admitting: Oncology

## 2020-07-13 ENCOUNTER — Ambulatory Visit
Admission: RE | Admit: 2020-07-13 | Discharge: 2020-07-13 | Disposition: A | Discharge status: Home / Self Care | Payer: Medicare Other | Source: Ambulatory Visit | Attending: Radiation Oncology | Admitting: Radiation Oncology

## 2020-07-13 ENCOUNTER — Other Ambulatory Visit: Payer: Self-pay

## 2020-07-13 DIAGNOSIS — C7951 Secondary malignant neoplasm of bone: Secondary | ICD-10-CM

## 2020-07-13 DIAGNOSIS — Z170 Estrogen receptor positive status [ER+]: Secondary | ICD-10-CM

## 2020-07-13 NOTE — Progress Notes (Signed)
Radiation Oncology         [phone] ________________________________  Outpatient Follow Up - Conducted via telephone due to current COVID-19 concerns for limiting patient exposure  I spoke with the patient to conduct this visit via telephone to spare the patient unnecessary potential exposure in the healthcare setting during the current COVID-19 pandemic. The patient was notified in advance and was offered a [facility] meeting to allow for face to face communication but unfortunately reported that they did not have the appropriate resources/technology to support such a visit and instead preferred to proceed with a telephone visit.  ________________________________  Name: Jessica Glenn MRN: [id number]  Date of Service: 07/13/2020  DOB: 09/11/1929  Follow Up Note  CC: Maury Dus, MD  Maury Dus, MD  Diagnosis: Progressive metastatic stage II-III, T3 N0 M1 grade 1 ER/PR Positive invasive ductal carcinoma of the left breast with a T2 lesion  Interval Since Last Radiation: 2 years  07/05/2018 Stereotactic Radiosurgery: T2 Spine // 18 Gy in 1 fraction  05/01/2018 - 06/15/2018: The patient initially received a dose of 50.4 Gy in 28 fractions to the left chest wall and supraclavicular region. This was delivered using a 3-D conformal, 4 field technique. The patient then received a boost to the mastectomy scar. This delivered an additional 10 Gy in 5 fractions using an en face electron field. The total dose was 60.4 Gy.  Narrative: In summary the patient was diagnosed with stage II or stage III breast disease initially, and on staging imaging was found to have a lytic appearing lesion at T2.  She went on to proceed with mastectomy of the left breast followed by postmastectomy radiation to the left chest wall and regional nodes.  She then went on to receive stereotactic radiosurgery to the T-spine and one fraction.  She is followed in surveillance and she has been NED. She continues with Zometa  and Anastrozole and has been NED as well. A recent CT chest on 07/02/20 showed stable nodules in her lungs, and her Total MRI spine showed expected post treatment changes at T2, and stable findings at T8 that were unchanged and at T9, in conversation overall felt to be stable. No new disease was noted.                On review of systems, the patient reports that she is doing well overall. She denies any back pain, numbness of her extremities, trunk, or weakness. She denies any new musculoskeletal or joint aches or pains, new skin lesions or concerns. A complete review of systems is obtained and is otherwise negative.   Past Medical History:  Past Medical History:  Diagnosis Date  . Basal cell carcinoma    s/p Moh's surgery  to left side of nose  . Breast cancer, left breast ([place]) dx'd 05/2017  . History of tobacco use   . Squamous cell carcinoma of skin 11/18/2019   Left pretibial. KA-type. EDC  . Varicose vein of leg    BLE    Past Surgical History: Past Surgical History:  Procedure Laterality Date  . BREAST BIOPSY Left 05/2017  . CATARACT EXTRACTION W/ INTRAOCULAR LENS  IMPLANT, BILATERAL Bilateral   . DILATION AND CURETTAGE OF UTERUS    . FRACTURE SURGERY    . HARDWARE REMOVAL Right 02/14/2017   Procedure: RIGHT ANKLE HARDWARE REMOVAL;  Surgeon: [name], MD;  Location: [facility];  Service: Orthopedics;  Laterality: Right;  . I & D EXTREMITY Right  02/14/2017   Procedure: RIGHT IRRIGATION AND DEBRIDEMENT ANKLE;  Surgeon: [name], MD;  Location: [facility];  Service: Orthopedics;  Laterality: Right;  . MASTECTOMY Left 02/14/2018   LEFT MASTECTOMY WITH DEEP LEFT AXILLARY SENTINEL LYMPH NODE BIOPSY   . MASTECTOMY W/ SENTINEL NODE BIOPSY Left 02/14/2018   Procedure: LEFT MASTECTOMY WITH SENTINEL LYMPH NODE BIOPSY AND POSSIBLE NODE DISSECTION;  Surgeon: Jovita Kussmaul, MD;  Location: [facility];  Service: General;  Laterality: Left;  . MOHS SURGERY  Left    "side of nose"  . ORIF ANKLE FRACTURE Right 02/02/2016   Procedure: OPEN REDUCTION INTERNAL FIXATION (ORIF) ANKLE FRACTURE;  Surgeon: [name], MD;  Location: [facility];  Service: Orthopedics;  Laterality: Right;  strykermini c armreg bedbone foam    Social History:  Social History   Socioeconomic History  . Marital status: Married    Spouse name: Not on file  . Number of children: Not on file  . Years of education: Not on file  . Highest education level: Not on file  Occupational History  . Not on file  Tobacco Use  . Smoking status: Former Smoker    Packs/day: 1.00    Years: 8.00    Pack years: 8.00    Types: Cigarettes    Quit date: 1960    Years since quitting: 61.7  . Smokeless tobacco: Never Used  Vaping Use  . Vaping Use: Never used  Substance and Sexual Activity  . Alcohol use: Yes    Alcohol/week: 5.0 standard drinks    Types: 5 Glasses of wine per week  . Drug use: No  . Sexual activity: Not Currently  Other Topics Concern  . Not on file  Social History Narrative  . Not on file   Social Determinants of Health   Financial Resource Strain:   . Difficulty of Paying Living Expenses: Not on file  Food Insecurity:   . Worried About Charity fundraiser in the Last Year: Not on file  . Ran Out of Food in the Last Year: Not on file  Transportation Needs:   . Lack of Transportation (Medical): Not on file  . Lack of Transportation (Non-Medical): Not on file  Physical Activity:   . Days of Exercise per Week: Not on file  . Minutes of Exercise per Session: Not on file  Stress:   . Feeling of Stress : Not on file  Social Connections:   . Frequency of Communication with Friends and Family: Not on file  . Frequency of Social Gatherings with Friends and Family: Not on file  . Attends Religious Services: Not on file  . Active Member of Clubs or Organizations: Not on file  . Attends Archivist Meetings: Not on file  . Marital  Status: Not on file  Intimate Partner Violence:   . Fear of Current or Ex-Partner: Not on file  . Emotionally Abused: Not on file  . Physically Abused: Not on file  . Sexually Abused: Not on file  The patient is married.  She has 4 sons.  Her husband is a retired Theme park manager and they live in [place].  Family History: Family History  Problem Relation Age of Onset  . COPD Mother   . Emphysema Mother      ALLERGIES:  has No Known Allergies.  Meds: Current Outpatient Medications  Medication Sig Dispense Refill  . anastrozole (ARIMIDEX) 1 MG tablet Take 1 tablet (1 mg total) by  mouth daily. 90 tablet 4  . ascorbic acid (VITAMIN C) 1000 MG tablet Take 1,000 mg by mouth daily.    . Calcium Carb-Cholecalciferol (CALCIUM 600 + D PO) Take 1 tablet by mouth daily.    . Cholecalciferol (VITAMIN D3) 3000 units TABS Take 3,000 Units by mouth daily.    . Cyanocobalamin (B-12) 5000 MCG CAPS Take 5,000 mcg by mouth daily.    . diphenhydramine-acetaminophen (TYLENOL PM) 25-500 MG TABS tablet Take 1-2 tablets by mouth at bedtime.     Marland Kitchen doxylamine, Sleep, (UNISOM) 25 MG tablet Take 25 mg by mouth at bedtime as needed for sleep.    . magnesium oxide (MAG-OX) 400 (241.3 Mg) MG tablet Take 1 tablet (400 mg total) by mouth daily.    . mirtazapine (REMERON) 15 MG tablet Take 0.5 tablets (7.5 mg total) by mouth at bedtime.    . Multiple Vitamin (MULTIVITAMIN WITH MINERALS) TABS tablet Take 1 tablet by mouth daily.    Marland Kitchen thiamine (VITAMIN B-1) 50 MG tablet Take 1 tablet (50 mg total) by mouth daily.     No current facility-administered medications for this encounter.    Physical Findings: Unable to assess due to nature of encounter   Lab Findings: Lab Results  Component Value Date   WBC 5.1 04/15/2020   HGB 13.9 04/15/2020   HCT 41.7 04/15/2020   MCV 91.0 04/15/2020   PLT 160 04/15/2020     Radiographic Findings: CT Chest W Contrast  Result Date: 07/02/2020 CLINICAL DATA:  Breast cancer,  assess treatment response EXAM: CT CHEST WITH CONTRAST TECHNIQUE: Multidetector CT imaging of the chest was performed during intravenous contrast administration. CONTRAST:  82mL OMNIPAQUE IOHEXOL 300 MG/ML  SOLN COMPARISON:  CT of 02/05/2018 FINDINGS: Cardiovascular: Heart size is normal. No pericardial effusion. Calcified and noncalcified atheromatous plaque of the thoracic aorta is similar to the prior study. Central pulmonary arteries unremarkable on venous phase assessment. Mediastinum/Nodes: Thoracic inlet structures are unremarkable. Post LEFT axillary dissection and LEFT mastectomy. No axillary lymphadenopathy. Lungs/Pleura: Scattered small pulmonary nodules are unchanged. For instance, RIGHT lower lobe pulmonary nodules on image 82 of series 5 measuring approximately 3-4 mm. Another RIGHT lower lobe pulmonary nodule seen anteriorly approximately 4 mm unchanged. No consolidation or signs of pleural effusion. Mild bronchiectasis in RIGHT middle lobe and lingula. Upper Abdomen: Incidental imaging of upper abdominal contents showing stable appearance of low-attenuation foci in the liver. Liver is incompletely imaged. No acute upper abdominal findings. Musculoskeletal: No acute musculoskeletal process. Destructive lesion is however noted in the upper thoracic spine at the T2 level with lytic change which violates the posterior cortex but shows no change since previous imaging from 2019. Previously discussed T8 lesion is not well seen on current CT. Please refer to dedicated thoracic spine evaluation. IMPRESSION: 1. Stable small pulmonary nodules. There are also signs of chronic infection with mild bronchiectasis in RIGHT middle lobe and lingula. Nodules are likely related to this process or similar benign etiology given stability over time. 2. Unchanged destructive lesion at the T2 level. 3. Aortic atherosclerosis. Aortic Atherosclerosis (ICD10-I70.0). Electronically Signed   By: Zetta Bills M.D.   On:  07/02/2020 12:57   MR TOTAL SPINE METS SCREENING  Result Date: 07/02/2020 CLINICAL DATA:  Breast cancer.  Assess treatment response. EXAM: MRI TOTAL SPINE WITHOUT AND WITH CONTRAST TECHNIQUE: Multisequence MR imaging of the spine from the cervical spine to the sacrum was performed prior to and following IV contrast administration for evaluation of spinal metastatic disease.  CONTRAST:  20mL GADAVIST GADOBUTROL 1 MMOL/ML IV SOLN COMPARISON:  01/29/2020 FINDINGS: MRI CERVICAL SPINE FINDINGS Alignment: Normal Vertebrae: No metastatic lesion in the cervical region. Cord: Normal.  No cord compression or cord metastasis. Posterior Fossa, vertebral arteries, paraspinal tissues: Negative Disc levels: Ordinary mid cervical spondylosis. No compressive canal stenosis. Mild bilateral foraminal narrowing at C5-6. MRI THORACIC SPINE FINDINGS Alignment:  No thoracic malalignment. Vertebrae: Old treated lesion within the right posterior side at T2. No evidence of recurrent viable disease in this location. No canal encroachment. Focus of abnormal enhancement in the posterior right side of T8 is unchanged. Abnormal enhancement at the inferior endplate of T9 has increased slightly. Resolution of the previously seen enhancing focus at the superior endplate of S56. mild enhancement at the posteroinferior endplate of C12 favored to be reactive enhancement adjacent to a Schmorl's node. Cord:  No thoracic cord lesion. Disc levels: Ordinary chronic thoracic spondylosis. No disc herniation or stenosis. The patient does have some enhancement associated with the right facet at T11-12 and T12-L1 consistent with ordinary degenerative change. MRI LUMBAR SPINE FINDINGS Segmentation:  5 lumbar type vertebral bodies. Alignment:  Minimal scoliotic curvature convex to the left. Vertebrae: No focal bone finding to suggest osseous metastatic disease in the region. Enhancement associated with the facet joints particularly on the right at L3-4 which  could be painful. Conus medullaris: Extends to the L2 level and appears normal. Paraspinal and other soft tissues: Otherwise negative Disc levels: Mild disc bulges in the lumbar region. Facet degeneration and hypertrophy on the left at L2-3 with mild left lateral recess narrowing. Pronounced facet and ligamentous hypertrophy on the right at L3-4 lateral recess and foraminal narrowing. Right more than left facet arthritis at L4-5 with right lateral recess and foraminal narrowing. Bilateral facet arthritis at L5-S1 without compressive stenosis. IMPRESSION: Cervical region: Negative for metastatic disease. Thoracic region: Treated metastasis at T2 is unchanged without evidence of residual or recurrent viable disease. Subcentimeter enhancing focus in the right T8 vertebral body is unchanged. Slightly increased enhancement at the inferior endplate of T9, favored to be degenerative. Resolution of previously seen enhancement at the superior endplate of X51. New minor enhancement at the posteroinferior endplate of Z00 probably associated with a Schmorl's node. Lumbar region: No sign of osseous metastatic disease at this time. Lumbar facet arthropathy most pronounced on the right at L3-4 with lateral recess and foraminal stenosis. Electronically Signed   By: Nelson Chimes M.D.   On: 07/02/2020 14:04    Impression/Plan: 1. Progressive Metastatic Stage II-III [id number], grade1, ER/PR positive invasive ductal carcinoma of the left breast with disease to the thoracic spine. Jessica Glenn continues to be NED without new or active disease in the spine. We reviewed her MRI results and can move to 6 month intervals for imaging with once a year total spine imaging. She will also continue Zometa and Anastrozole. She will follow up as well with Dr. Jana Hakim and his team in medical oncology.   Given current concerns for patient exposure during the COVID-19 pandemic, this encounter was conducted via telephone.  The patient has provided  two factor identification and has given verbal consent for this type of encounter and has been advised to only accept a meeting of this type in a secure network environment. The time spent during this encounter was 35 minutes including preparation, discussion, and coordination of the patient's care. The attendants for this meeting include  Hayden Pedro  and [name].  During the encounter,  Hayden Pedro was located at [facility] Radiation Oncology Department.  [name] was located at home.      Carola Rhine, PAC

## 2020-07-14 ENCOUNTER — Other Ambulatory Visit: Payer: Self-pay | Admitting: *Deleted

## 2020-07-14 DIAGNOSIS — C50412 Malignant neoplasm of upper-outer quadrant of left female breast: Secondary | ICD-10-CM

## 2020-07-15 ENCOUNTER — Inpatient Hospital Stay: Payer: Medicare Other

## 2020-07-15 ENCOUNTER — Other Ambulatory Visit: Payer: Self-pay

## 2020-07-15 ENCOUNTER — Inpatient Hospital Stay (HOSPITAL_BASED_OUTPATIENT_CLINIC_OR_DEPARTMENT_OTHER): Payer: Medicare Other | Admitting: Adult Health

## 2020-07-15 ENCOUNTER — Inpatient Hospital Stay: Payer: Medicare Other | Attending: Oncology

## 2020-07-15 VITALS — BP 172/83 | HR 64 | Temp 97.4°F | Resp 18 | Ht 65.0 in | Wt 162.3 lb

## 2020-07-15 DIAGNOSIS — Z170 Estrogen receptor positive status [ER+]: Secondary | ICD-10-CM | POA: Insufficient documentation

## 2020-07-15 DIAGNOSIS — C50412 Malignant neoplasm of upper-outer quadrant of left female breast: Secondary | ICD-10-CM | POA: Diagnosis not present

## 2020-07-15 DIAGNOSIS — Z79811 Long term (current) use of aromatase inhibitors: Secondary | ICD-10-CM | POA: Diagnosis not present

## 2020-07-15 DIAGNOSIS — C7951 Secondary malignant neoplasm of bone: Secondary | ICD-10-CM

## 2020-07-15 DIAGNOSIS — C50912 Malignant neoplasm of unspecified site of left female breast: Secondary | ICD-10-CM

## 2020-07-15 LAB — CBC WITH DIFFERENTIAL (CANCER CENTER ONLY)
Abs Immature Granulocytes: 0 10*3/uL (ref 0.00–0.07)
Basophils Absolute: 0 10*3/uL (ref 0.0–0.1)
Basophils Relative: 0 %
Eosinophils Absolute: 0.1 10*3/uL (ref 0.0–0.5)
Eosinophils Relative: 3 %
HCT: 41 % (ref 36.0–46.0)
Hemoglobin: 13.3 g/dL (ref 12.0–15.0)
Immature Granulocytes: 0 %
Lymphocytes Relative: 29 %
Lymphs Abs: 1.3 10*3/uL (ref 0.7–4.0)
MCH: 29.4 pg (ref 26.0–34.0)
MCHC: 32.4 g/dL (ref 30.0–36.0)
MCV: 90.7 fL (ref 80.0–100.0)
Monocytes Absolute: 0.5 10*3/uL (ref 0.1–1.0)
Monocytes Relative: 11 %
Neutro Abs: 2.7 10*3/uL (ref 1.7–7.7)
Neutrophils Relative %: 57 %
Platelet Count: 141 10*3/uL — ABNORMAL LOW (ref 150–400)
RBC: 4.52 MIL/uL (ref 3.87–5.11)
RDW: 12.7 % (ref 11.5–15.5)
WBC Count: 4.7 10*3/uL (ref 4.0–10.5)
nRBC: 0 % (ref 0.0–0.2)

## 2020-07-15 LAB — CMP (CANCER CENTER ONLY)
ALT: 9 U/L (ref 0–44)
AST: 19 U/L (ref 15–41)
Albumin: 4.1 g/dL (ref 3.5–5.0)
Alkaline Phosphatase: 53 U/L (ref 38–126)
Anion gap: 7 (ref 5–15)
BUN: 19 mg/dL (ref 8–23)
CO2: 27 mmol/L (ref 22–32)
Calcium: 9.7 mg/dL (ref 8.9–10.3)
Chloride: 104 mmol/L (ref 98–111)
Creatinine: 0.9 mg/dL (ref 0.44–1.00)
GFR, Est AFR Am: >60 mL/min (ref >60)
GFR, Estimated: 57 mL/min — ABNORMAL LOW (ref >60)
Glucose, Bld: 111 mg/dL — ABNORMAL HIGH (ref 70–99)
Potassium: 4.1 mmol/L (ref 3.5–5.1)
Sodium: 138 mmol/L (ref 135–145)
Total Bilirubin: 0.6 mg/dL (ref 0.3–1.2)
Total Protein: 7.2 g/dL (ref 6.5–8.1)

## 2020-07-15 MED ORDER — ZOLEDRONIC ACID 4 MG/100ML IV SOLN
4.0000 mg | Freq: Once | INTRAVENOUS | Status: AC
  Administered 2020-07-15: 4 mg via INTRAVENOUS

## 2020-07-15 MED ORDER — ZOLEDRONIC ACID 4 MG/5ML IV CONC: 4.0000 mg | Freq: Once | INTRAVENOUS | Status: DC

## 2020-07-15 MED ORDER — ZOLEDRONIC ACID 4 MG/100ML IV SOLN
INTRAVENOUS | Status: AC
  Filled 2020-07-15: qty 100

## 2020-07-15 MED ORDER — SODIUM CHLORIDE 0.9 % IV SOLN
INTRAVENOUS | Status: DC
  Filled 2020-07-15: qty 250

## 2020-07-15 NOTE — Progress Notes (Signed)
[place] [facility]  [phone] [phone]     ID: Jessica Glenn DOB: 1929/09/23  MR#: [id number]  HUD#:[id number]  Patient Care Team: Maury Dus, MD as PCP - General (Family Medicine) Magrinat, Virgie Dad, MD as Consulting Physician (Oncology) Jovita Kussmaul, MD as Consulting Physician (General Surgery) Kyung Rudd, MD as Consulting Physician (Radiation Oncology) OTHER MD:  CHIEF COMPLAINT: Locally advanced/metastatic estrogen receptor positive left breast cancer  CURRENT TREATMENT: Anastrozole; zoledronate   INTERVAL HISTORY: Tyrell returns today for follow-up and treatment of her metastatic breast cancer.   She continues on anastrozole.  She has had absolutely no side effects from this medication that she is aware of.    She is due for Zoledronate today.  She takes two tums on the day before her infusion.  She underwent MRI of the spine on 07/02/2020 that demonstrated her treated metastases, but had no concern for compression of her metastatic cancer.  She also underwent CT chest on 07/02/2020 that showed chronic infection, stable pulmonary nodules, and no sign of metastatic breast cancer progression.  REVIEW OF SYSTEMS: Laniya notes she is feeling well today.  She has no current issues.  A detailed ROS was non contributory today.     HISTORY OF CURRENT ILLNESS: From the original intake note:  Jessica Glenn tells me she first noted a changes in her left breast about a year and a half ago. She had had some dental work around that time and thought possibly that could be related. She said her breast felt "like steel". She did not bring it to medical attention until her recent appointment with Dr. Alyson Ingles and he set her up for bilateral diagnostic mammography with tomography and left breast ultrasonography at the [facility] 06/19/2017. This found the breast density to be category C. In the upper outer retroareolar left breast there was an irregular mass estimated to be at  least 6 cm by mammography. There were heterogeneous calcifications within the mass. There was skin thickening of the areola and an enlarged inferior left axillary lymph node was noted. On exam there was a large fixed hard mass in the upper outer quadrant of the left breast causing protrusion of the areola. It measured approximately 7 cm by palpation and there was visible skin thickening. In the inferior left axilla there was a firm palpable mass measuring 2 cm.  Ultrasound confirmed an irregular hypoechoic vascular mass in the left breast centered on the 1:00 position 4 cm from the nipple measuring 6.7 cm. This was abutting the overlying skin. It was a 2.3 cm hypoechoic vascular mass in the left axilla. The right breast was unremarkable.  On 06/22/2017 the patient had left breast and left axillary node biopsy, showing ([id number]) both sites to be involved by invasive ductal carcinoma, grade 2, with prognostic panels on both biopsies showing estrogen receptor 100% positivity, progesterone receptor 40-50% positivity, all with strong staining intensity, MIB-1:30 percent in the breast and 15% in the lymph nodes, and both HER-2 negative, with ratios of 0.76-0.85 and number per cell 1.10-1.30.  The patient's subsequent history is as detailed below.   PAST MEDICAL HISTORY: Past Medical History:  Diagnosis Date  . Basal cell carcinoma    s/p Moh's surgery  to left side of nose  . Breast cancer, left breast ([place]) dx'd 05/2017  . History of tobacco use   . Squamous cell carcinoma of skin 11/18/2019   Left pretibial. KA-type. EDC  . Varicose vein of leg  BLE    PAST SURGICAL HISTORY: Past Surgical History:  Procedure Laterality Date  . BREAST BIOPSY Left 05/2017  . CATARACT EXTRACTION W/ INTRAOCULAR LENS  IMPLANT, BILATERAL Bilateral   . DILATION AND CURETTAGE OF UTERUS    . FRACTURE SURGERY    . HARDWARE REMOVAL Right 02/14/2017   Procedure: RIGHT ANKLE HARDWARE REMOVAL;  Surgeon: [name], MD;  Location: [facility];  Service: Orthopedics;  Laterality: Right;  . I & D EXTREMITY Right 02/14/2017   Procedure: RIGHT IRRIGATION AND DEBRIDEMENT ANKLE;  Surgeon: [name], MD;  Location: [facility];  Service: Orthopedics;  Laterality: Right;  . MASTECTOMY Left 02/14/2018   LEFT MASTECTOMY WITH DEEP LEFT AXILLARY SENTINEL LYMPH NODE BIOPSY   . MASTECTOMY W/ SENTINEL NODE BIOPSY Left 02/14/2018   Procedure: LEFT MASTECTOMY WITH SENTINEL LYMPH NODE BIOPSY AND POSSIBLE NODE DISSECTION;  Surgeon: Jovita Kussmaul, MD;  Location: [facility];  Service: General;  Laterality: Left;  . MOHS SURGERY Left    "side of nose"  . ORIF ANKLE FRACTURE Right 02/02/2016   Procedure: OPEN REDUCTION INTERNAL FIXATION (ORIF) ANKLE FRACTURE;  Surgeon: [name], MD;  Location: [facility];  Service: Orthopedics;  Laterality: Right;  strykermini c armreg bedbone foam    FAMILY HISTORY Family History  Problem Relation Age of Onset  . COPD Mother   . Emphysema Mother   The patient's father had a history of alcoholism and a band in the family. The patient has no information on the paternal side. The patient's mother died from emphysema at the age of 24. The patient has one sister, 2 brothers. There is no history of breast or ovarian cancer in the family to her knowledge.   GYNECOLOGIC HISTORY:  No LMP recorded. Patient is postmenopausal. Menarche age 32, first live birth age 36, the patient is [place] P4. She went through the change of life in her 83s. She took hormone replacement for a few years.    SOCIAL HISTORY:  The patient is originally from [place]. Her husband Jessica Glenn is a traveling Theme park manager, nondenominational. Son Jessica Glenn lives in [place] where he works as a Theme park manager; son Jessica Glenn lives in [place] where he works as an Optometrist; son Jessica Glenn lives in [place] where he works as a Chief Strategy Officer; son Jessica Glenn also lives in [place] and works as a Chief Strategy Officer.  The patient has 14 grandchildren and 27 great-grandchildren    ADVANCED DIRECTIVES: In place   HEALTH MAINTENANCE: Social History   Tobacco Use  . Smoking status: Former Smoker    Packs/day: 1.00    Years: 8.00    Pack years: 8.00    Types: Cigarettes    Quit date: 1960    Years since quitting: 61.7  . Smokeless tobacco: Never Used  Vaping Use  . Vaping Use: Never used  Substance Use Topics  . Alcohol use: Yes    Alcohol/week: 5.0 standard drinks    Types: 5 Glasses of wine per week  . Drug use: No     Colonoscopy: Never  PAP:  Bone density: Osteopenia   No Known Allergies  Current Outpatient Medications  Medication Sig Dispense Refill  . anastrozole (ARIMIDEX) 1 MG tablet Take 1 tablet (1 mg total) by mouth daily. 90 tablet 4  . ascorbic acid (VITAMIN C) 1000 MG tablet Take 1,000 mg by mouth daily.    . Calcium Carb-Cholecalciferol (CALCIUM 600 + D PO) Take 1 tablet by mouth daily.    Marland Kitchen  Cholecalciferol (VITAMIN D3) 3000 units TABS Take 3,000 Units by mouth daily.    . Cyanocobalamin (B-12) 5000 MCG CAPS Take 5,000 mcg by mouth daily.    . magnesium oxide (MAG-OX) 400 (241.3 Mg) MG tablet Take 1 tablet (400 mg total) by mouth daily.    . melatonin 3 MG TABS tablet Take 3 mg by mouth at bedtime.    . Multiple Vitamin (MULTIVITAMIN WITH MINERALS) TABS tablet Take 1 tablet by mouth daily.    Marland Kitchen thiamine (VITAMIN B-1) 50 MG tablet Take 1 tablet (50 mg total) by mouth daily.     No current facility-administered medications for this visit.    OBJECTIVE:   Vitals:   07/15/20 0952  BP: (!) 172/83  Pulse: 64  Resp: 18  Temp: (!) 97.4 F (36.3 C)  SpO2: 97%   Wt Readings from Last 3 Encounters:  07/15/20 162 lb 4.8 oz (73.6 kg)  04/15/20 161 lb 11.2 oz (73.3 kg)  01/22/20 163 lb 4.8 oz (74.1 kg)   Body mass index is 27.01 kg/m.    ECOG FS:1 - Symptomatic but completely ambulatory GENERAL: Patient is a well appearing female in no acute distress HEENT:  Sclerae  anicteric.  Mask in place  Neck is supple.  NODES:  No cervical, supraclavicular, or axillary lymphadenopathy palpated.  BREAST EXAM:  Deferred. LUNGS:  Clear to auscultation bilaterally.  No wheezes or rhonchi. HEART:  Regular rate and rhythm. No murmur appreciated. ABDOMEN:  Soft, nontender.  Positive, normoactive bowel sounds. No organomegaly palpated. MSK:  No focal spinal tenderness to palpation. Full range of motion bilaterally in the upper extremities. EXTREMITIES:  No peripheral edema.   SKIN:  Clear with no obvious rashes or skin changes. No nail dyscrasia. NEURO:  Nonfocal. Well oriented.  Appropriate affect.   LAB RESULTS:  CMP     Component Value Date/Time   NA 138 07/15/2020 0847   NA 140 10/27/2017 0908   K 4.1 07/15/2020 0847   K 4.8 10/27/2017 0908   CL 104 07/15/2020 0847   CO2 27 07/15/2020 0847   CO2 27 10/27/2017 0908   GLUCOSE 111 (H) 07/15/2020 0847   GLUCOSE 96 10/27/2017 0908   BUN 19 07/15/2020 0847   BUN 20.5 10/27/2017 0908   CREATININE 0.90 07/15/2020 0847   CREATININE 0.9 10/27/2017 0908   CALCIUM 9.7 07/15/2020 0847   CALCIUM 9.7 10/27/2017 0908   PROT 7.2 07/15/2020 0847   PROT 7.2 10/27/2017 0908   ALBUMIN 4.1 07/15/2020 0847   ALBUMIN 4.2 10/27/2017 0908   AST 19 07/15/2020 0847   AST 19 10/27/2017 0908   ALT 9 07/15/2020 0847   ALT 8 10/27/2017 0908   ALKPHOS 53 07/15/2020 0847   ALKPHOS 58 10/27/2017 0908   BILITOT 0.6 07/15/2020 0847   BILITOT 0.62 10/27/2017 0908   GFRNONAA 57 (L) 07/15/2020 0847   GFRAA >60 07/15/2020 0847    No results found for: TOTALPROTELP, ALBUMINELP, A1GS, A2GS, BETS, BETA2SER, GAMS, MSPIKE, SPEI  No results found for: KPAFRELGTCHN, LAMBDASER, KAPLAMBRATIO  Lab Results  Component Value Date   WBC 4.7 07/15/2020   NEUTROABS 2.7 07/15/2020   HGB 13.3 07/15/2020   HCT 41.0 07/15/2020   MCV 90.7 07/15/2020   PLT 141 (L) 07/15/2020   Lab Results  Component Value Date   CA2729 61.6 (H) 07/05/2018     Urinalysis No results found for: COLORURINE, APPEARANCEUR, LABSPEC, PHURINE, GLUCOSEU, HGBUR, BILIRUBINUR, KETONESUR, PROTEINUR, UROBILINOGEN, NITRITE, LEUKOCYTESUR   STUDIES: CT Chest W  Contrast  Result Date: 07/02/2020 CLINICAL DATA:  Breast cancer, assess treatment response EXAM: CT CHEST WITH CONTRAST TECHNIQUE: Multidetector CT imaging of the chest was performed during intravenous contrast administration. CONTRAST:  32m OMNIPAQUE IOHEXOL 300 MG/ML  SOLN COMPARISON:  CT of 02/05/2018 FINDINGS: Cardiovascular: Heart size is normal. No pericardial effusion. Calcified and noncalcified atheromatous plaque of the thoracic aorta is similar to the prior study. Central pulmonary arteries unremarkable on venous phase assessment. Mediastinum/Nodes: Thoracic inlet structures are unremarkable. Post LEFT axillary dissection and LEFT mastectomy. No axillary lymphadenopathy. Lungs/Pleura: Scattered small pulmonary nodules are unchanged. For instance, RIGHT lower lobe pulmonary nodules on image 82 of series 5 measuring approximately 3-4 mm. Another RIGHT lower lobe pulmonary nodule seen anteriorly approximately 4 mm unchanged. No consolidation or signs of pleural effusion. Mild bronchiectasis in RIGHT middle lobe and lingula. Upper Abdomen: Incidental imaging of upper abdominal contents showing stable appearance of low-attenuation foci in the liver. Liver is incompletely imaged. No acute upper abdominal findings. Musculoskeletal: No acute musculoskeletal process. Destructive lesion is however noted in the upper thoracic spine at the T2 level with lytic change which violates the posterior cortex but shows no change since previous imaging from 2019. Previously discussed T8 lesion is not well seen on current CT. Please refer to dedicated thoracic spine evaluation. IMPRESSION: 1. Stable small pulmonary nodules. There are also signs of chronic infection with mild bronchiectasis in RIGHT middle lobe and lingula. Nodules  are likely related to this process or similar benign etiology given stability over time. 2. Unchanged destructive lesion at the T2 level. 3. Aortic atherosclerosis. Aortic Atherosclerosis (ICD10-I70.0). Electronically Signed   By: GZetta BillsM.D.   On: 07/02/2020 12:57   MR TOTAL SPINE METS SCREENING  Result Date: 07/02/2020 CLINICAL DATA:  Breast cancer.  Assess treatment response. EXAM: MRI TOTAL SPINE WITHOUT AND WITH CONTRAST TECHNIQUE: Multisequence MR imaging of the spine from the cervical spine to the sacrum was performed prior to and following IV contrast administration for evaluation of spinal metastatic disease. CONTRAST:  733mGADAVIST GADOBUTROL 1 MMOL/ML IV SOLN COMPARISON:  01/29/2020 FINDINGS: MRI CERVICAL SPINE FINDINGS Alignment: Normal Vertebrae: No metastatic lesion in the cervical region. Cord: Normal.  No cord compression or cord metastasis. Posterior Fossa, vertebral arteries, paraspinal tissues: Negative Disc levels: Ordinary mid cervical spondylosis. No compressive canal stenosis. Mild bilateral foraminal narrowing at C5-6. MRI THORACIC SPINE FINDINGS Alignment:  No thoracic malalignment. Vertebrae: Old treated lesion within the right posterior side at T2. No evidence of recurrent viable disease in this location. No canal encroachment. Focus of abnormal enhancement in the posterior right side of T8 is unchanged. Abnormal enhancement at the inferior endplate of T9 has increased slightly. Resolution of the previously seen enhancing focus at the superior endplate of T1B34mild enhancement at the posteroinferior endplate of T1L93avored to be reactive enhancement adjacent to a Schmorl's node. Cord:  No thoracic cord lesion. Disc levels: Ordinary chronic thoracic spondylosis. No disc herniation or stenosis. The patient does have some enhancement associated with the right facet at T11-12 and T12-L1 consistent with ordinary degenerative change. MRI LUMBAR SPINE FINDINGS Segmentation:  5 lumbar  type vertebral bodies. Alignment:  Minimal scoliotic curvature convex to the left. Vertebrae: No focal bone finding to suggest osseous metastatic disease in the region. Enhancement associated with the facet joints particularly on the right at L3-4 which could be painful. Conus medullaris: Extends to the L2 level and appears normal. Paraspinal and other soft tissues: Otherwise negative Disc levels: Mild  disc bulges in the lumbar region. Facet degeneration and hypertrophy on the left at L2-3 with mild left lateral recess narrowing. Pronounced facet and ligamentous hypertrophy on the right at L3-4 lateral recess and foraminal narrowing. Right more than left facet arthritis at L4-5 with right lateral recess and foraminal narrowing. Bilateral facet arthritis at L5-S1 without compressive stenosis. IMPRESSION: Cervical region: Negative for metastatic disease. Thoracic region: Treated metastasis at T2 is unchanged without evidence of residual or recurrent viable disease. Subcentimeter enhancing focus in the right T8 vertebral body is unchanged. Slightly increased enhancement at the inferior endplate of T9, favored to be degenerative. Resolution of previously seen enhancement at the superior endplate of T01. New minor enhancement at the posteroinferior endplate of S01 probably associated with a Schmorl's node. Lumbar region: No sign of osseous metastatic disease at this time. Lumbar facet arthropathy most pronounced on the right at L3-4 with lateral recess and foraminal stenosis. Electronically Signed   By: Nelson Chimes M.D.   On: 07/02/2020 14:04     ELIGIBLE FOR AVAILABLE RESEARCH PROTOCOL: no  ASSESSMENT: 86 y.o. [place] woman status post left breast overlapping sites biopsy 06/22/2017 for a clinical T3 N1-2, stage II-III invasive ductal carcinoma, grade 2, estrogen and progesterone receptor positive, HER-2 nonamplified, with an MIB-1 of 30%  (a) staging CT of the chest and bone survey and bone scan  July 11, 2017 showed multiple indeterminant subcentimeter pulmonary nodules which will require follow-up, as well as an indeterminate right hepatic lobe lesion, but no definite evidence of malignancy.  (b) CA-27-29 is informative  (1) neo-adjuvant fulvestrant started on 07/05/2017, discontinued 12/20/2017 with possible progression  (2) status post left mastectomy and sentinel lymph node sampling 02/14/2018 for a T3 N1, stage IIA invasive ductal carcinoma, grade 1, with negative margins.  Repeat prognostic panel again estrogen and progesterone receptor positive, HER-2 negative  (a) 2 of 7 removed lymph nodes were positive  (3) adjuvant radiation to follow surgery  (4) anastrozole started 01/17/2018  (a) start palbociclib once disease progression is documented  (5) METASTATIC DISEASE: April 2019  (a) CT scan of the chest 02/05/2018 shows a new lesion at T2  (b) total spinal MRI 03/14/2018 confirms lesion at T2 and possible minimal lesion at T8, neither amenable to biopsy; there are no other suspicious lesions  (6)  adjuvant radiation 05/01/2018 -06/15/2018  (a) received capecitabine sensitization given the extent of local disease  (b) Site/dose:   The patient initially received a dose of 50.4 Gy in 28 fractions to the left chest wall and supraclavicular region. This was delivered using a 3-D conformal, 4 field technique. The patient then received a boost to the mastectomy scar. This delivered an additional 10 Gy in 5 fractions using an en face electron field. The total dose was 60.4 Gy  (c) stereotactic radiosurgery to T2 given 07/05/2018  (7) started zolendronate 07/11/2018, to be repeated every 12 weeks  (a) bone density 10/10/2019 shows a T score of -2.0   PLAN: Ilse continues on Anastrozole daily along with Zometa every 3 months for her metastatic breast cancer.  She is tolerating this well.  She has no clinical or radiographic signs of breast cancer progression.  This is good news.   She will continue her current treatment regimen.    She was recommended to continue healthy diet and activity.  She will return on 09/30/2020 for labs, f/u with GM, and her next Zometa.  She knows to call for any questions that may arise between  now and her next appointment.  We are happy to see her sooner if needed.   Total encounter time 20 minutes.Wilber Bihari, NP 07/20/20 12:27 PM Medical Oncology and Hematology [facility] [place], [place] [address] Tel. [phone]    Fax. [phone]    *Total Encounter Time as defined by the Centers for Medicare and Medicaid Services includes, in addition to the face-to-face time of a patient visit (documented in the note above) non-face-to-face time: obtaining and reviewing outside history, ordering and reviewing medications, tests or procedures, care coordination (communications with other health care professionals or caregivers) and documentation in the medical record.

## 2020-07-15 NOTE — Patient Instructions (Signed)
Zoledronic Acid injection (Hypercalcemia, Oncology) What is this medicine? ZOLEDRONIC ACID (ZOE le dron ik AS id) lowers the amount of calcium loss from bone. It is used to treat too much calcium in your blood from cancer. It is also used to prevent complications of cancer that has spread to the bone. This medicine may be used for other purposes; ask your health care provider or pharmacist if you have questions. COMMON BRAND NAME(S): Zometa What should I tell my health care provider before I take this medicine? They need to know if you have any of these conditions:  aspirin-sensitive asthma  cancer, especially if you are receiving medicines used to treat cancer  dental disease or wear dentures  infection  kidney disease  receiving corticosteroids like dexamethasone or prednisone  an unusual or allergic reaction to zoledronic acid, other medicines, foods, dyes, or preservatives  pregnant or trying to get pregnant  breast-feeding How should I use this medicine? This medicine is for infusion into a vein. It is given by a health care professional in a hospital or clinic setting. Talk to your pediatrician regarding the use of this medicine in children. Special care may be needed. Overdosage: If you think you have taken too much of this medicine contact a poison control center or emergency room at once. NOTE: This medicine is only for you. Do not share this medicine with others. What if I miss a dose? It is important not to miss your dose. Call your doctor or health care professional if you are unable to keep an appointment. What may interact with this medicine?  certain antibiotics given by injection  NSAIDs, medicines for pain and inflammation, like ibuprofen or naproxen  some diuretics like bumetanide, furosemide  teriparatide  thalidomide This list may not describe all possible interactions. Give your health care provider a list of all the medicines, herbs, non-prescription  drugs, or dietary supplements you use. Also tell them if you smoke, drink alcohol, or use illegal drugs. Some items may interact with your medicine. What should I watch for while using this medicine? Visit your doctor or health care professional for regular checkups. It may be some time before you see the benefit from this medicine. Do not stop taking your medicine unless your doctor tells you to. Your doctor may order blood tests or other tests to see how you are doing. Women should inform their doctor if they wish to become pregnant or think they might be pregnant. There is a potential for serious side effects to an unborn child. Talk to your health care professional or pharmacist for more information. You should make sure that you get enough calcium and vitamin D while you are taking this medicine. Discuss the foods you eat and the vitamins you take with your health care professional. Some people who take this medicine have severe bone, joint, and/or muscle pain. This medicine may also increase your risk for jaw problems or a broken thigh bone. Tell your doctor right away if you have severe pain in your jaw, bones, joints, or muscles. Tell your doctor if you have any pain that does not go away or that gets worse. Tell your dentist and dental surgeon that you are taking this medicine. You should not have major dental surgery while on this medicine. See your dentist to have a dental exam and fix any dental problems before starting this medicine. Take good care of your teeth while on this medicine. Make sure you see your dentist for regular follow-up   appointments. What side effects may I notice from receiving this medicine? Side effects that you should report to your doctor or health care professional as soon as possible:  allergic reactions like skin rash, itching or hives, swelling of the face, lips, or tongue  anxiety, confusion, or depression  breathing problems  changes in vision  eye  pain  feeling faint or lightheaded, falls  jaw pain, especially after dental work  mouth sores  muscle cramps, stiffness, or weakness  redness, blistering, peeling or loosening of the skin, including inside the mouth  trouble passing urine or change in the amount of urine Side effects that usually do not require medical attention (report to your doctor or health care professional if they continue or are bothersome):  bone, joint, or muscle pain  constipation  diarrhea  fever  hair loss  irritation at site where injected  loss of appetite  nausea, vomiting  stomach upset  trouble sleeping  trouble swallowing  weak or tired This list may not describe all possible side effects. Call your doctor for medical advice about side effects. You may report side effects to FDA at 1-800-FDA-1088. Where should I keep my medicine? This drug is given in a hospital or clinic and will not be stored at home. NOTE: This sheet is a summary. It may not cover all possible information. If you have questions about this medicine, talk to your doctor, pharmacist, or health care provider.  2020 Elsevier/Gold Standard (2014-03-15 14:19:39)  

## 2020-07-16 ENCOUNTER — Telehealth: Payer: Self-pay | Admitting: Oncology

## 2020-07-16 ENCOUNTER — Telehealth: Payer: Self-pay | Admitting: Hematology and Oncology

## 2020-07-16 NOTE — Telephone Encounter (Signed)
Scheduled appts per 9/16 los. Left voicemail with new appt arrival time.

## 2020-09-14 ENCOUNTER — Other Ambulatory Visit: Payer: Self-pay | Admitting: Oncology

## 2020-09-14 ENCOUNTER — Ambulatory Visit: Payer: Medicare Other | Admitting: Dermatology

## 2020-09-14 ENCOUNTER — Other Ambulatory Visit: Payer: Self-pay

## 2020-09-14 DIAGNOSIS — L820 Inflamed seborrheic keratosis: Secondary | ICD-10-CM

## 2020-09-14 NOTE — Progress Notes (Signed)
   Follow-Up Visit   Subjective  Jessica Glenn is a 86 y.o. female who presents for the following: Follow-up. Patient here today for 3 month ISK follow up at the right posterior waistline. Patient advises the area has cleared up.   The following portions of the chart were reviewed this encounter and updated as appropriate:  Tobacco  Allergies  Meds  Problems  Med Hx  Surg Hx  Fam Hx     Review of Systems:  No other skin or systemic complaints except as noted in HPI or Assessment and Plan.  Objective  Well appearing patient in no apparent distress; mood and affect are within normal limits.  A focused examination was performed including trunk. Relevant physical exam findings are noted in the Assessment and Plan.  Objective  Right posterior waistline: Clear    Assessment & Plan  Inflamed seborrheic keratosis Right posterior waistline Recently treated and clear today. Benign-appearing.  Observation.  Call clinic for new or changing lesions.  Recommend daily use of broad spectrum spf 30+ sunscreen to sun-exposed areas.   Return in about 9 months (around 06/14/2021) for TBSE.  Graciella Belton, RMA, am acting as scribe for Sarina Ser, MD . Documentation: I have reviewed the above documentation for accuracy and completeness, and I agree with the above.  Sarina Ser, MD

## 2020-09-15 ENCOUNTER — Encounter: Payer: Self-pay | Admitting: Dermatology

## 2020-09-29 NOTE — Progress Notes (Signed)
[place]  [phone] [phone]     ID: NOVIA LANSBERRY DOB: 84-07-1929  MR#: [id number]  BDZ#:[id number]  Patient Care Team: Maury Dus, MD as PCP - General (Family Medicine) Kynadie Yaun, Virgie Dad, MD as Consulting Physician (Oncology) Jovita Kussmaul, MD as Consulting Physician (General Surgery) Kyung Rudd, MD as Consulting Physician (Radiation Oncology) OTHER MD:  CHIEF COMPLAINT: Locally advanced/metastatic estrogen receptor positive breast cancer (s/p left mastectomy)  CURRENT TREATMENT: Anastrozole; zoledronate   INTERVAL HISTORY: Jaquitta returns today for follow-up of her metastatic breast cancer.   She continues on anastrozole.  She has had absolutely no side effects from this medication that she is aware of.  Her most recent bone density screening from 10/10/2019 showed a T-score of -2.0, which is considered osteopenic.  She is due for Zoledronate today.  She generally does well on the day of the infusion but then the next day she feels a little bit like the flu.  She takes Tums on the treatment day and the day after.  Since her last visit, she has not undergone any additional studies. She is up to date on mammography, most recently on 04/17/2020 at [facility].  She had restaging studies in September 2021 which were very favorable   REVIEW OF SYSTEMS: Janashia had 35 people over in her house after Thanksgiving's and greatly enjoyed that.  She tells me her husband had a small stroke which has not significantly incapacitated him but has added some more work to her plate.  She tells me he will be having some carotid treatments.  Aside from this a detailed review of systems today was noncontributory   COVID 19 VACCINATION STATUS:   Refuses vaccination   HISTORY OF CURRENT ILLNESS: From the original intake note:  Keiana tells me she first noted a changes in her left breast about a year and a half ago. She had had some dental work around that  time and thought possibly that could be related. She said her breast felt "like steel". She did not bring it to medical attention until her recent appointment with Dr. Alyson Ingles and he set her up for bilateral diagnostic mammography with tomography and left breast ultrasonography at the [facility] 06/19/2017. This found the breast density to be category C. In the upper outer retroareolar left breast there was an irregular mass estimated to be at least 6 cm by mammography. There were heterogeneous calcifications within the mass. There was skin thickening of the areola and an enlarged inferior left axillary lymph node was noted. On exam there was a large fixed hard mass in the upper outer quadrant of the left breast causing protrusion of the areola. It measured approximately 7 cm by palpation and there was visible skin thickening. In the inferior left axilla there was a firm palpable mass measuring 2 cm.  Ultrasound confirmed an irregular hypoechoic vascular mass in the left breast centered on the 1:00 position 4 cm from the nipple measuring 6.7 cm. This was abutting the overlying skin. It was a 2.3 cm hypoechoic vascular mass in the left axilla. The right breast was unremarkable.  On 06/22/2017 the patient had left breast and left axillary node biopsy, showing ([id number]) both sites to be involved by invasive ductal carcinoma, grade 2, with prognostic panels on both biopsies showing estrogen receptor 100% positivity, progesterone receptor 40-50% positivity, all with strong staining intensity, MIB-1:30 percent in the breast and 15% in the lymph nodes, and both HER-2 negative, with  ratios of 0.76-0.85 and number per cell 1.10-1.30.  The patient's subsequent history is as detailed below.   PAST MEDICAL HISTORY: Past Medical History:  Diagnosis Date  . Basal cell carcinoma    s/p Moh's surgery  to left side of nose  . Breast cancer, left breast ([facility] [place]) dx'd 05/2017  . History of tobacco use   . Squamous  cell carcinoma of skin 11/18/2019   Left pretibial. KA-type. EDC  . Varicose vein of leg    BLE    PAST SURGICAL HISTORY: Past Surgical History:  Procedure Laterality Date  . BREAST BIOPSY Left 05/2017  . CATARACT EXTRACTION W/ INTRAOCULAR LENS  IMPLANT, BILATERAL Bilateral   . DILATION AND CURETTAGE OF UTERUS    . FRACTURE SURGERY    . HARDWARE REMOVAL Right 02/14/2017   Procedure: RIGHT ANKLE HARDWARE REMOVAL;  Surgeon: [name], MD;  Location: [facility];  Service: Orthopedics;  Laterality: Right;  . I & D EXTREMITY Right 02/14/2017   Procedure: RIGHT IRRIGATION AND DEBRIDEMENT ANKLE;  Surgeon: [name], MD;  Location: [facility];  Service: Orthopedics;  Laterality: Right;  . MASTECTOMY Left 02/14/2018   LEFT MASTECTOMY WITH DEEP LEFT AXILLARY SENTINEL LYMPH NODE BIOPSY   . MASTECTOMY W/ SENTINEL NODE BIOPSY Left 02/14/2018   Procedure: LEFT MASTECTOMY WITH SENTINEL LYMPH NODE BIOPSY AND POSSIBLE NODE DISSECTION;  Surgeon: Jovita Kussmaul, MD;  Location: [facility];  Service: General;  Laterality: Left;  . MOHS SURGERY Left    "side of nose"  . ORIF ANKLE FRACTURE Right 02/02/2016   Procedure: OPEN REDUCTION INTERNAL FIXATION (ORIF) ANKLE FRACTURE;  Surgeon: [name], MD;  Location: [facility];  Service: Orthopedics;  Laterality: Right;  strykermini c armreg bedbone foam    FAMILY HISTORY Family History  Problem Relation Age of Onset  . COPD Mother   . Emphysema Mother   The patient's father had a history of alcoholism and a band in the family. The patient has no information on the paternal side. The patient's mother died from emphysema at the age of 18. The patient has one sister, 2 brothers. There is no history of breast or ovarian cancer in the family to her knowledge.   GYNECOLOGIC HISTORY:  No LMP recorded. Patient is postmenopausal. Menarche age 39, first live birth age 32, the patient is [place] P4. She went through  the change of life in her 51s. She took hormone replacement for a few years.    SOCIAL HISTORY:  The patient is originally from [place]. Her husband Laurey Arrow is a traveling Theme park manager, nondenominational. Son Laurey Arrow lives in [place] where he works as a Theme park manager; son Ronalee Belts lives in [place] where he works as an Optometrist; son Annie Main lives in [place] where he works as a Chief Strategy Officer; son Rodman Key also lives in [place] and works as a Chief Strategy Officer. The patient has 14 grandchildren and 28 great-grandchildren    ADVANCED DIRECTIVES: In place   HEALTH MAINTENANCE: Social History   Tobacco Use  . Smoking status: Former Smoker    Packs/day: 1.00    Years: 8.00    Pack years: 8.00    Types: Cigarettes    Quit date: 1960    Years since quitting: 61.9  . Smokeless tobacco: Never Used  Vaping Use  . Vaping Use: Never used  Substance Use Topics  . Alcohol use: Yes    Alcohol/week: 5.0 standard drinks    Types: 5 Glasses of wine per week  .  Drug use: No     Colonoscopy: Never  PAP:  Bone density: Osteopenia   No Known Allergies  Current Outpatient Medications  Medication Sig Dispense Refill  . anastrozole (ARIMIDEX) 1 MG tablet TAKE 1 TABLET BY MOUTH DAILY 90 tablet 4  . ascorbic acid (VITAMIN C) 1000 MG tablet Take 1,000 mg by mouth daily.    . Calcium Carb-Cholecalciferol (CALCIUM 600 + D PO) Take 1 tablet by mouth daily.    . Cholecalciferol (VITAMIN D3) 3000 units TABS Take 3,000 Units by mouth daily.    . Cyanocobalamin (B-12) 5000 MCG CAPS Take 5,000 mcg by mouth daily.    . magnesium oxide (MAG-OX) 400 (241.3 Mg) MG tablet Take 1 tablet (400 mg total) by mouth daily.    . melatonin 3 MG TABS tablet Take 3 mg by mouth at bedtime.    . Multiple Vitamin (MULTIVITAMIN WITH MINERALS) TABS tablet Take 1 tablet by mouth daily.    Marland Kitchen thiamine (VITAMIN B-1) 50 MG tablet Take 1 tablet (50 mg total) by mouth daily.     No current facility-administered medications for this visit.     OBJECTIVE: White woman who appears well  Vitals:   09/30/20 0906  BP: (!) 181/75  Pulse: 66  Resp: 18  Temp: 97.8 F (36.6 C)  SpO2: 100%   Wt Readings from Last 3 Encounters:  09/30/20 165 lb 9.6 oz (75.1 kg)  07/15/20 162 lb 4.8 oz (73.6 kg)  04/15/20 161 lb 11.2 oz (73.3 kg)   Body mass index is 27.56 kg/m.    ECOG FS:1 - Symptomatic but completely ambulatory  Sclerae unicteric, EOMs intact Wearing a mask No cervical or supraclavicular adenopathy Lungs no rales or rhonchi Heart regular rate and rhythm Abd soft, nontender, positive bowel sounds MSK no focal spinal tenderness, no upper extremity lymphedema Neuro: nonfocal, well oriented, appropriate affect Breasts: The right breast is unremarkable.  The left breast is status post mastectomy.  There is no evidence of chest wall recurrence.  Both axillae are benign.   LAB RESULTS:  CMP     Component Value Date/Time   NA 138 07/15/2020 0847   NA 140 10/27/2017 0908   K 4.1 07/15/2020 0847   K 4.8 10/27/2017 0908   CL 104 07/15/2020 0847   CO2 27 07/15/2020 0847   CO2 27 10/27/2017 0908   GLUCOSE 111 (H) 07/15/2020 0847   GLUCOSE 96 10/27/2017 0908   BUN 19 07/15/2020 0847   BUN 20.5 10/27/2017 0908   CREATININE 0.90 07/15/2020 0847   CREATININE 0.9 10/27/2017 0908   CALCIUM 9.7 07/15/2020 0847   CALCIUM 9.7 10/27/2017 0908   PROT 7.2 07/15/2020 0847   PROT 7.2 10/27/2017 0908   ALBUMIN 4.1 07/15/2020 0847   ALBUMIN 4.2 10/27/2017 0908   AST 19 07/15/2020 0847   AST 19 10/27/2017 0908   ALT 9 07/15/2020 0847   ALT 8 10/27/2017 0908   ALKPHOS 53 07/15/2020 0847   ALKPHOS 58 10/27/2017 0908   BILITOT 0.6 07/15/2020 0847   BILITOT 0.62 10/27/2017 0908   GFRNONAA 57 (L) 07/15/2020 0847   GFRAA >60 07/15/2020 0847    No results found for: TOTALPROTELP, ALBUMINELP, A1GS, A2GS, BETS, BETA2SER, GAMS, MSPIKE, SPEI  No results found for: KPAFRELGTCHN, LAMBDASER, [facility]  Lab Results  Component  Value Date   WBC 5.3 09/30/2020   NEUTROABS 3.3 09/30/2020   HGB 13.6 09/30/2020   HCT 41.9 09/30/2020   MCV 90.9 09/30/2020   PLT 159 09/30/2020  Lab Results  Component Value Date   CA2729 61.6 (H) 07/05/2018    Urinalysis No results found for: COLORURINE, APPEARANCEUR, LABSPEC, PHURINE, GLUCOSEU, HGBUR, BILIRUBINUR, KETONESUR, PROTEINUR, UROBILINOGEN, NITRITE, LEUKOCYTESUR   STUDIES: No results found.   ELIGIBLE FOR AVAILABLE RESEARCH PROTOCOL: no  ASSESSMENT: 86 y.o. [place] woman status post left breast overlapping sites biopsy 06/22/2017 for a clinical T3 N1-2, stage II-III invasive ductal carcinoma, grade 2, estrogen and progesterone receptor positive, HER-2 nonamplified, with an MIB-1 of 30%  (a) staging CT of the chest and bone survey and bone scan July 11, 2017 showed multiple indeterminant subcentimeter pulmonary nodules which will require follow-up, as well as an indeterminate right hepatic lobe lesion, but no definite evidence of malignancy.  (b) CA-27-29 is informative  (1) neo-adjuvant fulvestrant started on 07/05/2017, discontinued 12/20/2017 with possible progression  (2) status post left mastectomy and sentinel lymph node sampling 02/14/2018 for a T3 N1, stage IIA invasive ductal carcinoma, grade 1, with negative margins.  Repeat prognostic panel again estrogen and progesterone receptor positive, HER-2 negative  (a) 2 of 7 removed lymph nodes were positive  (3) adjuvant radiation to follow surgery  (4) anastrozole started 01/17/2018  (a) start palbociclib once disease progression is documented  (5) METASTATIC DISEASE: April 2019  (a) CT scan of the chest 02/05/2018 shows a new lesion at T2  (b) total spinal MRI 03/14/2018 confirms lesion at T2 and possible minimal lesion at T8, neither amenable to biopsy; there are no other suspicious lesions  (c) total spinal MRI 07/02/2020 showed the treated lesion at T2, stable abnormal enhancement at T8 and  perhaps slightly increased at T9.  There was resolution of enhancement at T10, otherwise some evidence of degenerative disease.  Cervical and lumbar spines were negative  (d) chest CT on 902 2021 shows stable small pulmonary nodules, mild bronchiectasis, and aortic atherosclerosis but no measurable disease.  (6)  adjuvant radiation 05/01/2018 -06/15/2018  (a) received capecitabine sensitization given the extent of local disease  (b) Site/dose:   The patient initially received a dose of 50.4 Gy in 28 fractions to the left chest wall and supraclavicular region. This was delivered using a 3-D conformal, 4 field technique. The patient then received a boost to the mastectomy scar. This delivered an additional 10 Gy in 5 fractions using an en face electron field. The total dose was 60.4 Gy  (c) stereotactic radiosurgery to T2 given 07/05/2018  (7) started zolendronate 07/11/2018, to be repeated every 12 weeks  (a) bone density 10/10/2019 shows a T score of -2.0  (b) bone density December 2022  (c) zolendronate changed to every 6 months after the 09/30/2020 dose   PLAN: Francenia is now 2-1/2 years out from definitive surgery for her breast cancer.  She has no symptoms related to her cancer.  Her disease when last imaged in September was very well controlled.  Furthermore she is tolerating her treatment remarkably well.  At this point I think we can extend this zoledronate to every 6 months.  Her next treatment will be in April and then October of next year.  She is very pleased with this change.  I have encouraged her to go ahead and receive vaccination for COVID-19 but at this point she is not planning on it  Total encounter time 25 minutes.Sarajane Jews C. Gahel Safley, MD 09/30/20 9:33 AM Medical Oncology and Hematology [facility] [place], [place] [address] Tel. [phone]    Fax. [phone]  I, Wilburn Mylar, am acting as scribe for Dr. Sarajane Jews C. Emett Stapel.  I,  Lurline Del MD, have reviewed the above documentation for accuracy and completeness, and I agree with the above.    *Total Encounter Time as defined by the Centers for Medicare and Medicaid Services includes, in addition to the face-to-face time of a patient visit (documented in the note above) non-face-to-face time: obtaining and reviewing outside history, ordering and reviewing medications, tests or procedures, care coordination (communications with other health care professionals or caregivers) and documentation in the medical record.

## 2020-09-30 ENCOUNTER — Other Ambulatory Visit: Payer: Self-pay

## 2020-09-30 ENCOUNTER — Inpatient Hospital Stay: Payer: Medicare Other | Admitting: Oncology

## 2020-09-30 ENCOUNTER — Inpatient Hospital Stay: Payer: Medicare Other | Attending: Oncology

## 2020-09-30 ENCOUNTER — Inpatient Hospital Stay: Payer: Medicare Other

## 2020-09-30 ENCOUNTER — Ambulatory Visit: Payer: Medicare Other | Admitting: Adult Health

## 2020-09-30 ENCOUNTER — Ambulatory Visit: Payer: Medicare Other

## 2020-09-30 ENCOUNTER — Other Ambulatory Visit: Payer: Medicare Other

## 2020-09-30 VITALS — BP 181/75 | HR 66 | Temp 97.8°F | Resp 18 | Ht 65.0 in | Wt 165.6 lb

## 2020-09-30 DIAGNOSIS — Z170 Estrogen receptor positive status [ER+]: Secondary | ICD-10-CM | POA: Diagnosis not present

## 2020-09-30 DIAGNOSIS — J479 Bronchiectasis, uncomplicated: Secondary | ICD-10-CM | POA: Insufficient documentation

## 2020-09-30 DIAGNOSIS — C50812 Malignant neoplasm of overlapping sites of left female breast: Secondary | ICD-10-CM

## 2020-09-30 DIAGNOSIS — C50412 Malignant neoplasm of upper-outer quadrant of left female breast: Secondary | ICD-10-CM | POA: Insufficient documentation

## 2020-09-30 DIAGNOSIS — C7951 Secondary malignant neoplasm of bone: Secondary | ICD-10-CM | POA: Insufficient documentation

## 2020-09-30 DIAGNOSIS — I700 Atherosclerosis of aorta: Secondary | ICD-10-CM | POA: Diagnosis not present

## 2020-09-30 DIAGNOSIS — M810 Age-related osteoporosis without current pathological fracture: Secondary | ICD-10-CM | POA: Insufficient documentation

## 2020-09-30 DIAGNOSIS — C50912 Malignant neoplasm of unspecified site of left female breast: Secondary | ICD-10-CM

## 2020-09-30 LAB — COMPREHENSIVE METABOLIC PANEL
ALT: 7 U/L (ref 0–44)
AST: 15 U/L (ref 15–41)
Albumin: 4.1 g/dL (ref 3.5–5.0)
Alkaline Phosphatase: 50 U/L (ref 38–126)
Anion gap: 7 (ref 5–15)
BUN: 17 mg/dL (ref 8–23)
CO2: 26 mmol/L (ref 22–32)
Calcium: 9.5 mg/dL (ref 8.9–10.3)
Chloride: 107 mmol/L (ref 98–111)
Creatinine, Ser: 0.86 mg/dL (ref 0.44–1.00)
GFR, Estimated: >60 mL/min (ref >60)
Glucose, Bld: 98 mg/dL (ref 70–99)
Potassium: 4.5 mmol/L (ref 3.5–5.1)
Sodium: 140 mmol/L (ref 135–145)
Total Bilirubin: 0.4 mg/dL (ref 0.3–1.2)
Total Protein: 7.2 g/dL (ref 6.5–8.1)

## 2020-09-30 LAB — CBC WITH DIFFERENTIAL/PLATELET
Abs Immature Granulocytes: 0.01 10*3/uL (ref 0.00–0.07)
Basophils Absolute: 0 10*3/uL (ref 0.0–0.1)
Basophils Relative: 1 %
Eosinophils Absolute: 0.2 10*3/uL (ref 0.0–0.5)
Eosinophils Relative: 4 %
HCT: 41.9 % (ref 36.0–46.0)
Hemoglobin: 13.6 g/dL (ref 12.0–15.0)
Immature Granulocytes: 0 %
Lymphocytes Relative: 22 %
Lymphs Abs: 1.1 10*3/uL (ref 0.7–4.0)
MCH: 29.5 pg (ref 26.0–34.0)
MCHC: 32.5 g/dL (ref 30.0–36.0)
MCV: 90.9 fL (ref 80.0–100.0)
Monocytes Absolute: 0.6 10*3/uL (ref 0.1–1.0)
Monocytes Relative: 11 %
Neutro Abs: 3.3 10*3/uL (ref 1.7–7.7)
Neutrophils Relative %: 62 %
Platelets: 159 10*3/uL (ref 150–400)
RBC: 4.61 MIL/uL (ref 3.87–5.11)
RDW: 12.8 % (ref 11.5–15.5)
WBC: 5.3 10*3/uL (ref 4.0–10.5)
nRBC: 0 % (ref 0.0–0.2)

## 2020-09-30 MED ORDER — SODIUM CHLORIDE 0.9 % IV SOLN
INTRAVENOUS | Status: DC
  Filled 2020-09-30: qty 250

## 2020-09-30 MED ORDER — ZOLEDRONIC ACID 4 MG/5ML IV CONC: 4.0000 mg | Freq: Once | INTRAVENOUS | Status: DC

## 2020-09-30 MED ORDER — ZOLEDRONIC ACID 4 MG/100ML IV SOLN
INTRAVENOUS | Status: AC
  Filled 2020-09-30: qty 100

## 2020-09-30 MED ORDER — ZOLEDRONIC ACID 4 MG/100ML IV SOLN
4.0000 mg | Freq: Once | INTRAVENOUS | Status: AC
  Administered 2020-09-30: 4 mg via INTRAVENOUS

## 2020-09-30 NOTE — Patient Instructions (Signed)
Zoledronic Acid injection (Hypercalcemia, Oncology) What is this medicine? ZOLEDRONIC ACID (ZOE le dron ik AS id) lowers the amount of calcium loss from bone. It is used to treat too much calcium in your blood from cancer. It is also used to prevent complications of cancer that has spread to the bone. This medicine may be used for other purposes; ask your health care provider or pharmacist if you have questions. COMMON BRAND NAME(S): Zometa What should I tell my health care provider before I take this medicine? They need to know if you have any of these conditions:  aspirin-sensitive asthma  cancer, especially if you are receiving medicines used to treat cancer  dental disease or wear dentures  infection  kidney disease  receiving corticosteroids like dexamethasone or prednisone  an unusual or allergic reaction to zoledronic acid, other medicines, foods, dyes, or preservatives  pregnant or trying to get pregnant  breast-feeding How should I use this medicine? This medicine is for infusion into a vein. It is given by a health care professional in a hospital or clinic setting. Talk to your pediatrician regarding the use of this medicine in children. Special care may be needed. Overdosage: If you think you have taken too much of this medicine contact a poison control center or emergency room at once. NOTE: This medicine is only for you. Do not share this medicine with others. What if I miss a dose? It is important not to miss your dose. Call your doctor or health care professional if you are unable to keep an appointment. What may interact with this medicine?  certain antibiotics given by injection  NSAIDs, medicines for pain and inflammation, like ibuprofen or naproxen  some diuretics like bumetanide, furosemide  teriparatide  thalidomide This list may not describe all possible interactions. Give your health care provider a list of all the medicines, herbs, non-prescription  drugs, or dietary supplements you use. Also tell them if you smoke, drink alcohol, or use illegal drugs. Some items may interact with your medicine. What should I watch for while using this medicine? Visit your doctor or health care professional for regular checkups. It may be some time before you see the benefit from this medicine. Do not stop taking your medicine unless your doctor tells you to. Your doctor may order blood tests or other tests to see how you are doing. Women should inform their doctor if they wish to become pregnant or think they might be pregnant. There is a potential for serious side effects to an unborn child. Talk to your health care professional or pharmacist for more information. You should make sure that you get enough calcium and vitamin D while you are taking this medicine. Discuss the foods you eat and the vitamins you take with your health care professional. Some people who take this medicine have severe bone, joint, and/or muscle pain. This medicine may also increase your risk for jaw problems or a broken thigh bone. Tell your doctor right away if you have severe pain in your jaw, bones, joints, or muscles. Tell your doctor if you have any pain that does not go away or that gets worse. Tell your dentist and dental surgeon that you are taking this medicine. You should not have major dental surgery while on this medicine. See your dentist to have a dental exam and fix any dental problems before starting this medicine. Take good care of your teeth while on this medicine. Make sure you see your dentist for regular follow-up   appointments. What side effects may I notice from receiving this medicine? Side effects that you should report to your doctor or health care professional as soon as possible:  allergic reactions like skin rash, itching or hives, swelling of the face, lips, or tongue  anxiety, confusion, or depression  breathing problems  changes in vision  eye  pain  feeling faint or lightheaded, falls  jaw pain, especially after dental work  mouth sores  muscle cramps, stiffness, or weakness  redness, blistering, peeling or loosening of the skin, including inside the mouth  trouble passing urine or change in the amount of urine Side effects that usually do not require medical attention (report to your doctor or health care professional if they continue or are bothersome):  bone, joint, or muscle pain  constipation  diarrhea  fever  hair loss  irritation at site where injected  loss of appetite  nausea, vomiting  stomach upset  trouble sleeping  trouble swallowing  weak or tired This list may not describe all possible side effects. Call your doctor for medical advice about side effects. You may report side effects to FDA at 1-800-FDA-1088. Where should I keep my medicine? This drug is given in a hospital or clinic and will not be stored at home. NOTE: This sheet is a summary. It may not cover all possible information. If you have questions about this medicine, talk to your doctor, pharmacist, or health care provider.  2020 Elsevier/Gold Standard (2014-03-15 14:19:39)  

## 2020-10-05 ENCOUNTER — Telehealth: Payer: Self-pay | Admitting: Oncology

## 2020-10-05 NOTE — Telephone Encounter (Signed)
Scheduled appts per 12/1 los. Pt stated she was told we needed to cancel February appts. Cancelled those appts, and sent a msg to GM confirming that appts needed to be cancelled. Pt confirmed new appt date and time.

## 2020-10-20 ENCOUNTER — Telehealth: Payer: Self-pay | Admitting: Radiation Therapy

## 2020-10-20 ENCOUNTER — Other Ambulatory Visit: Payer: Self-pay | Admitting: Radiation Therapy

## 2020-10-20 DIAGNOSIS — C7951 Secondary malignant neoplasm of bone: Secondary | ICD-10-CM

## 2020-10-20 NOTE — Telephone Encounter (Signed)
Spoke with Jessica Glenn about her upcoming MRI and telephone follow-up visit with Bryson Ha in March 2022. She has these appointments written down and was thankful for the call.   Mont Dutton R.T.(R)(T) Radiation Special Procedures Navigator

## 2020-12-23 ENCOUNTER — Ambulatory Visit: Payer: Medicare Other

## 2020-12-23 ENCOUNTER — Other Ambulatory Visit: Payer: Medicare Other

## 2021-01-18 ENCOUNTER — Telehealth: Payer: Self-pay

## 2021-01-18 NOTE — Telephone Encounter (Signed)
Left voicemail message in regards to telephone visit with Shona Simpson PA on 01/25/21 @ 2:00pm. Called to review meaningful use questions. TM

## 2021-01-20 ENCOUNTER — Telehealth: Payer: Self-pay

## 2021-01-20 ENCOUNTER — Encounter: Payer: Self-pay | Admitting: Radiation Oncology

## 2021-01-20 ENCOUNTER — Other Ambulatory Visit: Payer: Self-pay

## 2021-01-20 NOTE — Telephone Encounter (Signed)
Spoke with patient in regards to telephone visit with Shona Simpson PA on 01/25/21 @ 2:00pm. Patient verbalized understanding of appointment date and time. Reviewed meaningful use questions. TM

## 2021-01-20 NOTE — Telephone Encounter (Signed)
Left patient a voicemail message in regards to telephone visit with Shona Simpson PA on 01/25/21 @ 2:00pm. Called to review meaningful use questions. TM

## 2021-01-21 ENCOUNTER — Ambulatory Visit (HOSPITAL_COMMUNITY)
Admission: RE | Admit: 2021-01-21 | Discharge: 2021-01-21 | Disposition: A | Discharge status: Home / Self Care | Payer: Medicare Other | Source: Ambulatory Visit | Attending: Radiation Oncology | Admitting: Radiation Oncology

## 2021-01-21 ENCOUNTER — Other Ambulatory Visit: Payer: Self-pay

## 2021-01-21 DIAGNOSIS — C7951 Secondary malignant neoplasm of bone: Secondary | ICD-10-CM | POA: Diagnosis not present

## 2021-01-21 DIAGNOSIS — M5134 Other intervertebral disc degeneration, thoracic region: Secondary | ICD-10-CM | POA: Diagnosis not present

## 2021-01-21 MED ORDER — GADOBUTROL 1 MMOL/ML IV SOLN
7.0000 mL | Freq: Once | INTRAVENOUS | Status: AC | PRN
  Administered 2021-01-21: 7 mL via INTRAVENOUS

## 2021-01-25 ENCOUNTER — Ambulatory Visit
Admission: RE | Admit: 2021-01-25 | Discharge: 2021-01-25 | Disposition: A | Discharge status: Home / Self Care | Payer: Medicare Other | Source: Ambulatory Visit | Attending: Radiation Oncology | Admitting: Radiation Oncology

## 2021-01-25 ENCOUNTER — Telehealth: Payer: Self-pay | Admitting: Oncology

## 2021-01-25 ENCOUNTER — Inpatient Hospital Stay: Payer: Medicare Other | Attending: Radiation Oncology

## 2021-01-25 ENCOUNTER — Other Ambulatory Visit: Payer: Self-pay

## 2021-01-25 ENCOUNTER — Encounter: Payer: Self-pay | Admitting: Radiation Oncology

## 2021-01-25 DIAGNOSIS — C50812 Malignant neoplasm of overlapping sites of left female breast: Secondary | ICD-10-CM | POA: Diagnosis not present

## 2021-01-25 DIAGNOSIS — C7951 Secondary malignant neoplasm of bone: Secondary | ICD-10-CM | POA: Diagnosis not present

## 2021-01-25 DIAGNOSIS — Z08 Encounter for follow-up examination after completed treatment for malignant neoplasm: Secondary | ICD-10-CM | POA: Diagnosis not present

## 2021-01-25 NOTE — Telephone Encounter (Signed)
Scheduled appt per 3/28 inbasket msg. Pt aware.  

## 2021-01-25 NOTE — Progress Notes (Signed)
Radiation Oncology         [phone] ________________________________  Outpatient Follow Up - Conducted via telephone due to current COVID-19 concerns for limiting patient exposure  I spoke with the patient to conduct this visit via telephone to spare the patient unnecessary potential exposure in the healthcare setting during the current COVID-19 pandemic. The patient was notified in advance and was offered a [facility] meeting to allow for face to face communication but unfortunately reported that they did not have the appropriate resources/technology to support such a visit and instead preferred to proceed with a telephone visit.  ________________________________  Name: Jessica Glenn MRN: [id number]  Date of Service: 01/25/2021  DOB: 10/29/1929  Follow Up Note  CC: Maury Dus, MD  Maury Dus, MD  Diagnosis: Progressive metastatic stage II-III, T3 N0 M1 grade 1 ER/PR Positive invasive ductal carcinoma of the left breast with a T2 lesion  Interval Since Last Radiation: 2 years, 6 months  07/05/2018 Stereotactic Radiosurgery: T2 Spine was treated to 18 Gy in 1 fraction  05/01/2018 - 06/15/2018: The patient initially received a dose of 50.4 Gy in 28 fractions to the left chest wall and supraclavicular region. This was delivered using a 3-D conformal, 4 field technique. The patient then received a boost to the mastectomy scar. This delivered an additional 10 Gy in 5 fractions using an en face electron field. The total dose was 60.4 Gy.  Narrative: In summary the patient was diagnosed with stage II or stage III breast disease initially, and on staging imaging was found to have a lytic appearing lesion at T2.  She went on to proceed with mastectomy of the left breast followed by postmastectomy radiation to the left chest wall and regional nodes.  She then went on to receive stereotactic radiosurgery to the T-spine and one fraction.  She is followed in surveillance and she has been NED. She  continues with Zometa and Anastrozole as well.  She did have a total spine MRI last year in September that did show some chronic changes in the T8-T9 location and no concerns for active disease in T2. Her most recent MRI of the thoracic spine on 01/21/2021 again showed enhancement along the T8-9 region it was commented on that this was not previously seen but in conference it was felt to be a chronic finding rather than a new finding, stable changes in the T2 vertebral body were again noted and no new disease was otherwise appreciated.  She is planning to follow-up with Dr. Jana Hakim in a few weeks and will be due for her next Zometa infusion.  She is contacted today by telephone to discuss the results of her recent MRI scan.                On review of systems, the patient reports she is doing very well overall.  She denies any concerns with back pain at this time, weakness or loss of strength or sensation in the truncal region.  She states that her husband recently passed away and she has been trying to adjust to a new normal.  She feels fortunate that her adult children are close in proximity and can be present in her day-to-day life as well as be supportive in helping care for her.  No other complaints are otherwise verbalized.  Past Medical History:  Past Medical History:  Diagnosis Date  . Basal cell carcinoma    s/p Moh's surgery  to left side of nose  . Breast  cancer, left breast ([place]) dx'd 05/2017  . History of tobacco use   . Squamous cell carcinoma of skin 11/18/2019   Left pretibial. KA-type. EDC  . Varicose vein of leg    BLE    Past Surgical History: Past Surgical History:  Procedure Laterality Date  . BREAST BIOPSY Left 05/2017  . CATARACT EXTRACTION W/ INTRAOCULAR LENS  IMPLANT, BILATERAL Bilateral   . DILATION AND CURETTAGE OF UTERUS    . FRACTURE SURGERY    . HARDWARE REMOVAL Right 02/14/2017   Procedure: RIGHT ANKLE HARDWARE REMOVAL;  Surgeon: [name], MD;  Location:  [facility];  Service: Orthopedics;  Laterality: Right;  . I & D EXTREMITY Right 02/14/2017   Procedure: RIGHT IRRIGATION AND DEBRIDEMENT ANKLE;  Surgeon: [name], MD;  Location: [facility];  Service: Orthopedics;  Laterality: Right;  . MASTECTOMY Left 02/14/2018   LEFT MASTECTOMY WITH DEEP LEFT AXILLARY SENTINEL LYMPH NODE BIOPSY   . MASTECTOMY W/ SENTINEL NODE BIOPSY Left 02/14/2018   Procedure: LEFT MASTECTOMY WITH SENTINEL LYMPH NODE BIOPSY AND POSSIBLE NODE DISSECTION;  Surgeon: Jovita Kussmaul, MD;  Location: [facility];  Service: General;  Laterality: Left;  . MOHS SURGERY Left    "side of nose"  . ORIF ANKLE FRACTURE Right 02/02/2016   Procedure: OPEN REDUCTION INTERNAL FIXATION (ORIF) ANKLE FRACTURE;  Surgeon: [name], MD;  Location: [facility];  Service: Orthopedics;  Laterality: Right;  strykermini c armreg bedbone foam    Social History:  Social History   Socioeconomic History  . Marital status: Married    Spouse name: Not on file  . Number of children: Not on file  . Years of education: Not on file  . Highest education level: Not on file  Occupational History  . Not on file  Tobacco Use  . Smoking status: Former Smoker    Packs/day: 1.00    Years: 8.00    Pack years: 8.00    Types: Cigarettes    Quit date: 1960    Years since quitting: 62.2  . Smokeless tobacco: Never Used  Vaping Use  . Vaping Use: Never used  Substance and Sexual Activity  . Alcohol use: Yes    Alcohol/week: 5.0 standard drinks    Types: 5 Glasses of wine per week  . Drug use: No  . Sexual activity: Not Currently  Other Topics Concern  . Not on file  Social History Narrative  . Not on file   Social Determinants of Health   Financial Resource Strain: Not on file  Food Insecurity: Not on file  Transportation Needs: Not on file  Physical Activity: Not on file  Stress: Not on file  Social Connections: Not on file  Intimate Partner  Violence: Not on file  The patient is married.  She has 4 sons.  Her husband, a retired Theme park manager recently passed away. The patient lives in [place].  Family History: Family History  Problem Relation Age of Onset  . COPD Mother   . Emphysema Mother      ALLERGIES:  has No Known Allergies.  Meds: Current Outpatient Medications  Medication Sig Dispense Refill  . anastrozole (ARIMIDEX) 1 MG tablet TAKE 1 TABLET BY MOUTH DAILY 90 tablet 4  . magnesium oxide (MAG-OX) 400 (241.3 Mg) MG tablet Take 1 tablet (400 mg total) by mouth daily.    Marland Kitchen ascorbic acid (VITAMIN C) 1000 MG tablet Take 1,000 mg by mouth daily. (Patient not  taking: Reported on 01/20/2021)    . Calcium Carb-Cholecalciferol (CALCIUM 600 + D PO) Take 1 tablet by mouth daily. (Patient not taking: Reported on 01/20/2021)    . Cholecalciferol (VITAMIN D3) 3000 units TABS Take 3,000 Units by mouth daily. (Patient not taking: Reported on 01/20/2021)    . Cyanocobalamin (B-12) 5000 MCG CAPS Take 5,000 mcg by mouth daily. (Patient not taking: Reported on 01/20/2021)    . melatonin 3 MG TABS tablet Take 3 mg by mouth at bedtime. (Patient not taking: Reported on 01/20/2021)    . Multiple Vitamin (MULTIVITAMIN WITH MINERALS) TABS tablet Take 1 tablet by mouth daily. (Patient not taking: Reported on 01/20/2021)    . thiamine (VITAMIN B-1) 50 MG tablet Take 1 tablet (50 mg total) by mouth daily. (Patient not taking: Reported on 01/20/2021)     No current facility-administered medications for this encounter.    Physical Findings: Unable to assess due to nature of encounter   Lab Findings: Lab Results  Component Value Date   WBC 5.3 09/30/2020   HGB 13.6 09/30/2020   HCT 41.9 09/30/2020   MCV 90.9 09/30/2020   PLT 159 09/30/2020     Radiographic Findings: MR THORACIC SPINE W WO CONTRAST  Result Date: 01/22/2021 CLINICAL DATA:  Follow-up treated metastatic disease in the spine. EXAM: MRI THORACIC WITHOUT AND WITH CONTRAST  TECHNIQUE: Multiplanar and multiecho pulse sequences of the thoracic spine were obtained without and with intravenous contrast. CONTRAST:  68mL GADAVIST GADOBUTROL 1 MMOL/ML IV SOLN COMPARISON:  07/02/2020 FINDINGS: Alignment:  Unremarkable Vertebrae: Treated T2 posterior vertebral lesion measuring 13 mm in diameter. The lesion has cystic imaging characteristics with peripheral enhancement that is nonprogressive. Accentuated adjacent marrow fat from radiotherapy. T9 and T12-L1 endplate enhancement has subsided. Nodular T8 enhancement is unchanged and likely benign. There is a focus of dural based nodular enhancement measuring 9 mm on sagittal images, located along the ligamentum flavum left paracentral at T8-9. The appearance at this level is hypointense on sagittal T2 weighted imaging. Cord:  Normal signal and morphology. Paraspinal and other soft tissues: No noted perispinal mass or inflammation. Disc levels: Disc desiccation and narrowing diffusely with variable endplate degeneration and mild facet spurring. No compressive spinal stenosis. IMPRESSION: 1. 9 mm nodular enhancement along the left ligamentum flavum at T8-9, not seen in 2021. This could reflect metastatic disease or be related to nodular degenerative ligamentous thickening (which had a similar appearance in the lumbar spine on comparison study). Recommend imaging follow-up. 2. Stable treated metastasis at T2. Electronically Signed   By: Monte Fantasia M.D.   On: 01/22/2021 06:11    Impression/Plan: 1. Progressive Metastatic Stage II-III [id number], grade1, ER/PR positive invasive ductal carcinoma of the left breast with disease to the thoracic spine.  The patient is doing well clinically per report and by MRI imaging, does have stability in T2, and what appears to be chronic arthritic and degenerative changes of T8-T9.  In conference this morning it was discussed that she should be offered repeat imaging in 6 months time or sooner should she have  any symptoms, the patient is interested however in moving to annual surveillance but is willing to let us know if she is having any symptoms of concern which we reviewed today.  She will also continue to follow-up with Dr. Jana Hakim with Zometa infusions and received anastrozole as previously planned.  At the conclusion of our visit, she is pleased with her plans to follow her in 12 months time.  Given  current concerns for patient exposure during the COVID-19 pandemic, this encounter was conducted via telephone.  The patient has provided two factor identification and has given verbal consent for this type of encounter and has been advised to only accept a meeting of this type in a secure network environment. The time spent during this encounter was 35 minutes including preparation, discussion, and coordination of the patient's care. The attendants for this meeting include  Hayden Pedro  and [name].  During the encounter,  Hayden Pedro was located at [facility] Radiation Oncology Department.  [name] was located at home.      Carola Rhine, PAC

## 2021-02-15 ENCOUNTER — Telehealth: Payer: Self-pay | Admitting: Oncology

## 2021-02-15 ENCOUNTER — Telehealth: Payer: Self-pay | Admitting: *Deleted

## 2021-02-15 NOTE — Telephone Encounter (Signed)
Pt called informing she needs to have tooth removed but currently receiving zometa. Per Dr. Jana Hakim will move zometa out 3 mons. Pt informed, scheduling msg placed. Informed pt will receive a call for new appt.

## 2021-02-15 NOTE — Telephone Encounter (Signed)
R/s appts per 4/18 sch msg. Called pt, no answer. Left msg with appts date and times.

## 2021-02-17 ENCOUNTER — Inpatient Hospital Stay: Payer: Medicare Other | Admitting: Oncology

## 2021-02-17 ENCOUNTER — Inpatient Hospital Stay: Payer: Medicare Other | Admitting: Genetic Counselor

## 2021-02-17 ENCOUNTER — Inpatient Hospital Stay: Payer: Medicare Other

## 2021-03-07 ENCOUNTER — Other Ambulatory Visit: Payer: Self-pay | Admitting: Oncology

## 2021-04-26 ENCOUNTER — Other Ambulatory Visit: Payer: Medicare Other

## 2021-04-26 ENCOUNTER — Ambulatory Visit: Payer: Medicare Other

## 2021-04-26 ENCOUNTER — Ambulatory Visit: Payer: Medicare Other | Admitting: Oncology

## 2021-05-18 ENCOUNTER — Other Ambulatory Visit: Payer: Self-pay | Admitting: Family Medicine

## 2021-05-18 DIAGNOSIS — Z1231 Encounter for screening mammogram for malignant neoplasm of breast: Secondary | ICD-10-CM

## 2021-05-24 ENCOUNTER — Ambulatory Visit: Payer: Medicare Other | Admitting: Oncology

## 2021-05-24 ENCOUNTER — Ambulatory Visit: Payer: Medicare Other

## 2021-05-24 ENCOUNTER — Other Ambulatory Visit: Payer: Medicare Other

## 2021-05-25 ENCOUNTER — Telehealth: Payer: Self-pay | Admitting: Oncology

## 2021-05-25 NOTE — Telephone Encounter (Signed)
R/s appts per 7/19 sch msg. Pt aware.

## 2021-06-01 ENCOUNTER — Other Ambulatory Visit: Payer: Self-pay | Admitting: Family Medicine

## 2021-06-01 ENCOUNTER — Ambulatory Visit
Admission: RE | Admit: 2021-06-01 | Discharge: 2021-06-01 | Disposition: A | Discharge status: Home / Self Care | Payer: Medicare Other | Source: Ambulatory Visit | Attending: Family Medicine | Admitting: Family Medicine

## 2021-06-01 ENCOUNTER — Other Ambulatory Visit: Payer: Self-pay

## 2021-06-01 DIAGNOSIS — Z1231 Encounter for screening mammogram for malignant neoplasm of breast: Secondary | ICD-10-CM

## 2021-06-02 NOTE — Progress Notes (Signed)
[facility]  [phone]) [phone] [phone]     ID: Jessica Glenn DOB: 05-20-1929  MR#: [id number]  ONG#:[id number]  Patient Care Team: Maury Dus, MD as PCP - General (Family Medicine) Magrinat, Virgie Dad, MD as Consulting Physician (Oncology) Jovita Kussmaul, MD as Consulting Physician (General Surgery) Kyung Rudd, MD as Consulting Physician (Radiation Oncology) OTHER MD:  CHIEF COMPLAINT: Locally advanced/metastatic estrogen receptor positive breast cancer (s/p left mastectomy)  CURRENT TREATMENT: Anastrozole; zoledronate   INTERVAL HISTORY: Jessica Glenn returns today for follow-up of her metastatic breast cancer.   She continues on anastrozole.  She has no hot flashes or vaginal dryness that she notes.    She most recently received Zolendronic acid IV on 09/30/2020.  She is due for another dose today.  She is a few months delayed due to having a dental extraction that has healed well.    Her most recent restaging was completed on 07/02/2020 with MRI and CT chest that was stable.  Her f/u MRI spine in 12/2020 showed no disease progression.  She was offered MRI to be completed in September of 2022, however she declined, and noted she would agree to mammogram again in 12/2021.    Jessica Glenn underwent a right sided 3d screening mammogram on 06/01/2021 that has not yet been read by radiology.    Jessica Glenn was at [employer] last week.  She says that her husband passed away at the beginning of the year and she has spent the past 6 months transitioning to living without him.  She is intentionally trying to lose weight and is down about 12 pounds.  She is fasting between 5pm and 9 am.  She is doing well with this.  She is also walking every day.  She went to the beach last week with her son and grandson.     REVIEW OF SYSTEMS: Review of Systems  Constitutional:  Negative for appetite change, chills, fatigue, fever and unexpected weight change.  HENT:   Negative for hearing loss,  lump/mass and trouble swallowing.   Eyes:  Negative for eye problems and icterus.  Respiratory:  Negative for chest tightness, cough and shortness of breath.   Cardiovascular:  Negative for chest pain, leg swelling and palpitations.  Gastrointestinal:  Negative for abdominal distention, abdominal pain, constipation, diarrhea, nausea and vomiting.  Endocrine: Negative for hot flashes.  Genitourinary:  Negative for difficulty urinating.   Musculoskeletal:  Negative for arthralgias.  Skin:  Negative for itching and rash.  Neurological:  Negative for dizziness, extremity weakness, headaches and numbness.  Hematological:  Negative for adenopathy. Does not bruise/bleed easily.  Psychiatric/Behavioral:  Negative for depression. The patient is not nervous/anxious.       COVID 19 VACCINATION STATUS:   Refuses vaccination   HISTORY OF CURRENT ILLNESS: From the original intake note:  Jessica Glenn tells me she first noted a changes in her left breast about a year and a half ago. She had had some dental work around that time and thought possibly that could be related. She said her breast felt "like steel". She did not bring it to medical attention until her recent appointment with Dr. Alyson Glenn and he set her up for bilateral diagnostic mammography with tomography and left breast ultrasonography at the [facility] 06/19/2017. This found the breast density to be category C. In the upper outer retroareolar left breast there was an irregular mass estimated to be at least 6 cm by mammography. There were heterogeneous calcifications within the mass. There  was skin thickening of the areola and an enlarged inferior left axillary lymph node was noted. On exam there was a large fixed hard mass in the upper outer quadrant of the left breast causing protrusion of the areola. It measured approximately 7 cm by palpation and there was visible skin thickening. In the inferior left axilla there was a firm palpable mass measuring 2  cm.  Ultrasound confirmed an irregular hypoechoic vascular mass in the left breast centered on the 1:00 position 4 cm from the nipple measuring 6.7 cm. This was abutting the overlying skin. It was a 2.3 cm hypoechoic vascular mass in the left axilla. The right breast was unremarkable.  On 06/22/2017 the patient had left breast and left axillary node biopsy, showing ([id number]) both sites to be involved by invasive ductal carcinoma, grade 2, with prognostic panels on both biopsies showing estrogen receptor 100% positivity, progesterone receptor 40-50% positivity, all with strong staining intensity, MIB-1:30 percent in the breast and 15% in the lymph nodes, and both HER-2 negative, with ratios of 0.76-0.85 and number per cell 1.10-1.30.  The patient's subsequent history is as detailed below.   PAST MEDICAL HISTORY: Past Medical History:  Diagnosis Date   Basal cell carcinoma    s/p Moh's surgery  to left side of nose   Breast cancer, left breast ([place]) dx'd 05/2017   History of tobacco use    Squamous cell carcinoma of skin 11/18/2019   Left pretibial. KA-type. EDC   Varicose vein of leg    BLE    PAST SURGICAL HISTORY: Past Surgical History:  Procedure Laterality Date   BREAST BIOPSY Left 05/2017   CATARACT EXTRACTION W/ INTRAOCULAR LENS  IMPLANT, BILATERAL Bilateral    DILATION AND CURETTAGE OF UTERUS     FRACTURE SURGERY     HARDWARE REMOVAL Right 02/14/2017   Procedure: RIGHT ANKLE HARDWARE REMOVAL;  Surgeon: [name], MD;  Location: [facility];  Service: Orthopedics;  Laterality: Right;   I & D EXTREMITY Right 02/14/2017   Procedure: RIGHT IRRIGATION AND DEBRIDEMENT ANKLE;  Surgeon: [name], MD;  Location: [facility];  Service: Orthopedics;  Laterality: Right;   MASTECTOMY Left 02/14/2018   LEFT MASTECTOMY WITH DEEP LEFT AXILLARY SENTINEL LYMPH NODE BIOPSY    MASTECTOMY W/ SENTINEL NODE BIOPSY Left 02/14/2018   Procedure: LEFT  MASTECTOMY WITH SENTINEL LYMPH NODE BIOPSY AND POSSIBLE NODE DISSECTION;  Surgeon: Jovita Kussmaul, MD;  Location: [facility];  Service: General;  Laterality: Left;   MOHS SURGERY Left    "side of nose"   ORIF ANKLE FRACTURE Right 02/02/2016   Procedure: OPEN REDUCTION INTERNAL FIXATION (ORIF) ANKLE FRACTURE;  Surgeon: [name], MD;  Location: [facility];  Service: Orthopedics;  Laterality: Right;  strykermini c armreg bedbone foam    FAMILY HISTORY Family History  Problem Relation Age of Onset   COPD Mother    Emphysema Mother   The patient's father had a history of alcoholism and a band in the family. The patient has no information on the paternal side. The patient's mother died from emphysema at the age of 23. The patient has one sister, 2 brothers. There is no history of breast or ovarian cancer in the family to her knowledge.   GYNECOLOGIC HISTORY:  No LMP recorded. Patient is postmenopausal. Menarche age 70, first live birth age 25, the patient is [place] P4. She went through the change of life in her 69s. She took  hormone replacement for a few years.    SOCIAL HISTORY:  The patient is originally from [place]. Her husband Laurey Arrow is a traveling Theme park manager, nondenominational. Son Laurey Arrow lives in [place] where he works as a Theme park manager; son Ronalee Belts lives in [place] where he works as an Optometrist; son Annie Main lives in [place] where he works as a Chief Strategy Officer; son Rodman Key also lives in [place] and works as a Chief Strategy Officer. The patient has 14 grandchildren and 45 great-grandchildren    ADVANCED DIRECTIVES: In place   HEALTH MAINTENANCE: Social History   Tobacco Use   Smoking status: Former    Packs/day: 1.00    Years: 8.00    Pack years: 8.00    Types: Cigarettes    Quit date: 1960    Years since quitting: 62.6   Smokeless tobacco: Never  Vaping Use   Vaping Use: Never used  Substance Use Topics   Alcohol use: Yes    Alcohol/week: 5.0 standard drinks    Types: 5  Glasses of wine per week   Drug use: No     Colonoscopy: Never  PAP:  Bone density: Osteopenia   No Known Allergies  Current Outpatient Medications  Medication Sig Dispense Refill   anastrozole (ARIMIDEX) 1 MG tablet TAKE 1 TABLET BY MOUTH DAILY 90 tablet 4   ascorbic acid (VITAMIN C) 1000 MG tablet Take 1,000 mg by mouth daily.     Calcium Carb-Cholecalciferol (CALCIUM 600 + D PO) Take 1 tablet by mouth daily.     Cholecalciferol (VITAMIN D3) 3000 units TABS Take 3,000 Units by mouth daily.     Cyanocobalamin (B-12) 5000 MCG CAPS Take 5,000 mcg by mouth daily.     magnesium oxide (MAG-OX) 400 (241.3 Mg) MG tablet Take 1 tablet (400 mg total) by mouth daily.     melatonin 3 MG TABS tablet Take 3 mg by mouth at bedtime.     Multiple Vitamin (MULTIVITAMIN WITH MINERALS) TABS tablet Take 1 tablet by mouth daily.     thiamine (VITAMIN B-1) 50 MG tablet Take 50 mg by mouth daily.     No current facility-administered medications for this visit.    OBJECTIVE: White woman who appears well  Vitals:   06/03/21 1453  BP: (!) 171/60  Pulse: 74  Resp: 18  Temp: 97.7 F (36.5 C)  SpO2: 98%    Wt Readings from Last 3 Encounters:  06/03/21 152 lb 12.8 oz (69.3 kg)  09/30/20 165 lb 9.6 oz (75.1 kg)  07/15/20 162 lb 4.8 oz (73.6 kg)   Body mass index is 25.43 kg/m.    ECOG FS:1 - Symptomatic but completely ambulatory GENERAL: Patient is a well appearing female in no acute distress HEENT:  Sclerae anicteric.  Oropharynx clear and moist. No ulcerations or evidence of oropharyngeal candidiasis. Neck is supple.  NODES:  No cervical, supraclavicular, or axillary lymphadenopathy palpated.  BREAST EXAM:  left breast s/p mastectomy, no sign of local recurrence right breast benign LUNGS:  Clear to auscultation bilaterally.  No wheezes or rhonchi. HEART:  Regular rate and rhythm. No murmur appreciated. ABDOMEN:  Soft, nontender.  Positive, normoactive bowel sounds. No organomegaly  palpated. MSK:  No focal spinal tenderness to palpation. Full range of motion bilaterally in the upper extremities. EXTREMITIES:  No peripheral edema.   SKIN:  Clear with no obvious rashes or skin changes. No nail dyscrasia. NEURO:  Nonfocal. Well oriented.  Appropriate affect.    LAB RESULTS:  CMP     Component Value Date/Time  NA 140 09/30/2020 0919   NA 140 10/27/2017 0908   K 4.5 09/30/2020 0919   K 4.8 10/27/2017 0908   CL 107 09/30/2020 0919   CO2 26 09/30/2020 0919   CO2 27 10/27/2017 0908   GLUCOSE 98 09/30/2020 0919   GLUCOSE 96 10/27/2017 0908   BUN 17 09/30/2020 0919   BUN 20.5 10/27/2017 0908   CREATININE 0.86 09/30/2020 0919   CREATININE 0.90 07/15/2020 0847   CREATININE 0.9 10/27/2017 0908   CALCIUM 9.5 09/30/2020 0919   CALCIUM 9.7 10/27/2017 0908   PROT 7.2 09/30/2020 0919   PROT 7.2 10/27/2017 0908   ALBUMIN 4.1 09/30/2020 0919   ALBUMIN 4.2 10/27/2017 0908   AST 15 09/30/2020 0919   AST 19 07/15/2020 0847   AST 19 10/27/2017 0908   ALT 7 09/30/2020 0919   ALT 9 07/15/2020 0847   ALT 8 10/27/2017 0908   ALKPHOS 50 09/30/2020 0919   ALKPHOS 58 10/27/2017 0908   BILITOT 0.4 09/30/2020 0919   BILITOT 0.6 07/15/2020 0847   BILITOT 0.62 10/27/2017 0908   GFRNONAA >60 09/30/2020 0919   GFRNONAA 57 (L) 07/15/2020 0847   GFRAA >60 07/15/2020 0847    No results found for: TOTALPROTELP, ALBUMINELP, A1GS, A2GS, BETS, BETA2SER, GAMS, MSPIKE, SPEI  No results found for: KPAFRELGTCHN, LAMBDASER, KAPLAMBRATIO  Lab Results  Component Value Date   WBC 7.6 06/03/2021   NEUTROABS 5.1 06/03/2021   HGB 12.8 06/03/2021   HCT 37.0 06/03/2021   MCV 88.9 06/03/2021   PLT 141 (L) 06/03/2021   Lab Results  Component Value Date   CA2729 61.6 (H) 07/05/2018    Urinalysis No results found for: COLORURINE, APPEARANCEUR, LABSPEC, PHURINE, GLUCOSEU, HGBUR, BILIRUBINUR, KETONESUR, PROTEINUR, UROBILINOGEN, NITRITE, LEUKOCYTESUR   STUDIES: No results  found.   ELIGIBLE FOR AVAILABLE RESEARCH PROTOCOL: no  ASSESSMENT: 87 y.o. [place] woman status post left breast overlapping sites biopsy 06/22/2017 for a clinical T3 N1-2, stage II-III invasive ductal carcinoma, grade 2, estrogen and progesterone receptor positive, HER-2 nonamplified, with an MIB-1 of 30%  (a) staging CT of the chest and bone survey and bone scan July 11, 2017 showed multiple indeterminant subcentimeter pulmonary nodules which will require follow-up, as well as an indeterminate right hepatic lobe lesion, but no definite evidence of malignancy.  (b) CA-27-29 is informative  (1) neo-adjuvant fulvestrant started on 07/05/2017, discontinued 12/20/2017 with possible progression  (2) status post left mastectomy and sentinel lymph node sampling 02/14/2018 for a T3 N1, stage IIA invasive ductal carcinoma, grade 1, with negative margins.  Repeat prognostic panel again estrogen and progesterone receptor positive, HER-2 negative  (a) 2 of 7 removed lymph nodes were positive  (3) adjuvant radiation to follow surgery  (4) anastrozole started 01/17/2018  (a) start palbociclib once disease progression is documented  (5) METASTATIC DISEASE: April 2019  (a) CT scan of the chest 02/05/2018 shows a new lesion at T2  (b) total spinal MRI 03/14/2018 confirms lesion at T2 and possible minimal lesion at T8, neither amenable to biopsy; there are no other suspicious lesions  (c) total spinal MRI 07/02/2020 showed the treated lesion at T2, stable abnormal enhancement at T8 and perhaps slightly increased at T9.  There was resolution of enhancement at T10, otherwise some evidence of degenerative disease.  Cervical and lumbar spines were negative  (d) chest CT on 902 2021 shows stable small pulmonary nodules, mild bronchiectasis, and aortic atherosclerosis but no measurable disease.  (6)  adjuvant radiation 05/01/2018 -06/15/2018  (a)  received capecitabine sensitization given the extent  of local disease  (b) Site/dose:   The patient initially received a dose of 50.4 Gy in 28 fractions to the left chest wall and supraclavicular region. This was delivered using a 3-D conformal, 4 field technique. The patient then received a boost to the mastectomy scar. This delivered an additional 10 Gy in 5 fractions using an en face electron field. The total dose was 60.4 Gy  (c) stereotactic radiosurgery to T2 given 07/05/2018  (7) started zolendronate 07/11/2018, to be repeated every 12 weeks  (a) bone density 10/10/2019 shows a T score of -2.0  (b) bone density December 2022  (c) zolendronate changed to every 6 months after the 09/30/2020 dose   PLAN: Jessica Glenn is here today for f/u of her metastatic breast cancer.  She has no clinical sign of progression of her cancer.  She continues on Anastrozole with good tolerance and will continue this.  She is also receiving Zometa every 6 months and will proceed with this today.   Adine and I talked about how the zometa works as it take sthe calcium from her blood and puts it into the bones.  She plans to take two tums tonight as extra calcium.    She is exercising and intentionally losing weight to become more fit.  She notes that she is feeling better.  I congratulated her on the success.  She was encouraged to remain active.    Jessica Glenn met with Dr. Jana Hakim to review his upcoming retirement and f/u recommendations.    Jessica Glenn will return in 6 months for lab and f/u with Dr. Chryl Heck.  I will see her back for labs and f/u in one year.  She knows to call for any questions that may arise between now and her next appointment.  We are happy to see her sooner if needed.   Total encounter time 30 minutes.* in face to face visit time, lab review, chart review, order entry and documentation of the encounter.   Wilber Bihari, NP 06/03/21 3:08 PM Medical Oncology and Hematology [facility] [place], [place] [address] Tel. [phone]     Fax. [phone]     *Total Encounter Time as defined by the Centers for Medicare and Medicaid Services includes, in addition to the face-to-face time of a patient visit (documented in the note above) non-face-to-face time: obtaining and reviewing outside history, ordering and reviewing medications, tests or procedures, care coordination (communications with other health care professionals or caregivers) and documentation in the medical record.

## 2021-06-03 ENCOUNTER — Encounter: Payer: Self-pay | Admitting: Adult Health

## 2021-06-03 ENCOUNTER — Inpatient Hospital Stay: Payer: Medicare Other

## 2021-06-03 ENCOUNTER — Inpatient Hospital Stay: Payer: Medicare Other | Attending: Adult Health

## 2021-06-03 ENCOUNTER — Other Ambulatory Visit: Payer: Self-pay

## 2021-06-03 ENCOUNTER — Inpatient Hospital Stay: Payer: Medicare Other | Admitting: Adult Health

## 2021-06-03 VITALS — BP 171/60 | HR 74 | Temp 97.7°F | Resp 18 | Ht 65.0 in | Wt 152.8 lb

## 2021-06-03 DIAGNOSIS — C7951 Secondary malignant neoplasm of bone: Secondary | ICD-10-CM | POA: Insufficient documentation

## 2021-06-03 DIAGNOSIS — Z170 Estrogen receptor positive status [ER+]: Secondary | ICD-10-CM

## 2021-06-03 DIAGNOSIS — C50812 Malignant neoplasm of overlapping sites of left female breast: Secondary | ICD-10-CM

## 2021-06-03 DIAGNOSIS — Z79811 Long term (current) use of aromatase inhibitors: Secondary | ICD-10-CM | POA: Diagnosis not present

## 2021-06-03 DIAGNOSIS — C50912 Malignant neoplasm of unspecified site of left female breast: Secondary | ICD-10-CM

## 2021-06-03 LAB — CBC WITH DIFFERENTIAL/PLATELET
Abs Immature Granulocytes: 0.02 10*3/uL (ref 0.00–0.07)
Basophils Absolute: 0 10*3/uL (ref 0.0–0.1)
Basophils Relative: 1 %
Eosinophils Absolute: 0.2 10*3/uL (ref 0.0–0.5)
Eosinophils Relative: 2 %
HCT: 37 % (ref 36.0–46.0)
Hemoglobin: 12.8 g/dL (ref 12.0–15.0)
Immature Granulocytes: 0 %
Lymphocytes Relative: 21 %
Lymphs Abs: 1.6 10*3/uL (ref 0.7–4.0)
MCH: 30.8 pg (ref 26.0–34.0)
MCHC: 34.6 g/dL (ref 30.0–36.0)
MCV: 88.9 fL (ref 80.0–100.0)
Monocytes Absolute: 0.7 10*3/uL (ref 0.1–1.0)
Monocytes Relative: 9 %
Neutro Abs: 5.1 10*3/uL (ref 1.7–7.7)
Neutrophils Relative %: 67 %
Platelets: 141 10*3/uL — ABNORMAL LOW (ref 150–400)
RBC: 4.16 MIL/uL (ref 3.87–5.11)
RDW: 12.8 % (ref 11.5–15.5)
WBC: 7.6 10*3/uL (ref 4.0–10.5)
nRBC: 0 % (ref 0.0–0.2)

## 2021-06-03 LAB — COMPREHENSIVE METABOLIC PANEL
ALT: 8 U/L (ref 0–44)
AST: 19 U/L (ref 15–41)
Albumin: 4.2 g/dL (ref 3.5–5.0)
Alkaline Phosphatase: 53 U/L (ref 38–126)
Anion gap: 11 (ref 5–15)
BUN: 23 mg/dL (ref 8–23)
CO2: 22 mmol/L (ref 22–32)
Calcium: 9.7 mg/dL (ref 8.9–10.3)
Chloride: 107 mmol/L (ref 98–111)
Creatinine, Ser: 0.97 mg/dL (ref 0.44–1.00)
GFR, Estimated: 56 mL/min — ABNORMAL LOW (ref >60)
Glucose, Bld: 99 mg/dL (ref 70–99)
Potassium: 4.2 mmol/L (ref 3.5–5.1)
Sodium: 140 mmol/L (ref 135–145)
Total Bilirubin: 0.5 mg/dL (ref 0.3–1.2)
Total Protein: 7.1 g/dL (ref 6.5–8.1)

## 2021-06-03 MED ORDER — ZOLEDRONIC ACID 4 MG/5ML IV CONC: 4.0000 mg | Freq: Once | INTRAVENOUS | Status: DC

## 2021-06-03 MED ORDER — ZOLEDRONIC ACID 4 MG/100ML IV SOLN
INTRAVENOUS | Status: AC
  Filled 2021-06-03: qty 100

## 2021-06-03 MED ORDER — ZOLEDRONIC ACID 4 MG/100ML IV SOLN
4.0000 mg | Freq: Once | INTRAVENOUS | Status: AC
Start: 2021-06-03 — End: 2021-06-03
  Administered 2021-06-03: 4 mg via INTRAVENOUS

## 2021-06-03 MED ORDER — SODIUM CHLORIDE 0.9 % IV SOLN
Freq: Once | INTRAVENOUS | Status: AC
  Filled 2021-06-03: qty 250

## 2021-06-03 NOTE — Patient Instructions (Signed)
Zoledronic Acid injection (Hypercalcemia, Oncology) What is this medicine? ZOLEDRONIC ACID (ZOE le dron ik AS id) lowers the amount of calcium loss from bone. It is used to treat too much calcium in your blood from cancer. It is also used to prevent complications of cancer that has spread to the bone. This medicine may be used for other purposes; ask your health care provider or pharmacist if you have questions. COMMON BRAND NAME(S): Zometa What should I tell my health care provider before I take this medicine? They need to know if you have any of these conditions:  aspirin-sensitive asthma  cancer, especially if you are receiving medicines used to treat cancer  dental disease or wear dentures  infection  kidney disease  receiving corticosteroids like dexamethasone or prednisone  an unusual or allergic reaction to zoledronic acid, other medicines, foods, dyes, or preservatives  pregnant or trying to get pregnant  breast-feeding How should I use this medicine? This medicine is for infusion into a vein. It is given by a health care professional in a hospital or clinic setting. Talk to your pediatrician regarding the use of this medicine in children. Special care may be needed. Overdosage: If you think you have taken too much of this medicine contact a poison control center or emergency room at once. NOTE: This medicine is only for you. Do not share this medicine with others. What if I miss a dose? It is important not to miss your dose. Call your doctor or health care professional if you are unable to keep an appointment. What may interact with this medicine?  certain antibiotics given by injection  NSAIDs, medicines for pain and inflammation, like ibuprofen or naproxen  some diuretics like bumetanide, furosemide  teriparatide  thalidomide This list may not describe all possible interactions. Give your health care provider a list of all the medicines, herbs, non-prescription  drugs, or dietary supplements you use. Also tell them if you smoke, drink alcohol, or use illegal drugs. Some items may interact with your medicine. What should I watch for while using this medicine? Visit your doctor or health care professional for regular checkups. It may be some time before you see the benefit from this medicine. Do not stop taking your medicine unless your doctor tells you to. Your doctor may order blood tests or other tests to see how you are doing. Women should inform their doctor if they wish to become pregnant or think they might be pregnant. There is a potential for serious side effects to an unborn child. Talk to your health care professional or pharmacist for more information. You should make sure that you get enough calcium and vitamin D while you are taking this medicine. Discuss the foods you eat and the vitamins you take with your health care professional. Some people who take this medicine have severe bone, joint, and/or muscle pain. This medicine may also increase your risk for jaw problems or a broken thigh bone. Tell your doctor right away if you have severe pain in your jaw, bones, joints, or muscles. Tell your doctor if you have any pain that does not go away or that gets worse. Tell your dentist and dental surgeon that you are taking this medicine. You should not have major dental surgery while on this medicine. See your dentist to have a dental exam and fix any dental problems before starting this medicine. Take good care of your teeth while on this medicine. Make sure you see your dentist for regular follow-up   appointments. What side effects may I notice from receiving this medicine? Side effects that you should report to your doctor or health care professional as soon as possible:  allergic reactions like skin rash, itching or hives, swelling of the face, lips, or tongue  anxiety, confusion, or depression  breathing problems  changes in vision  eye  pain  feeling faint or lightheaded, falls  jaw pain, especially after dental work  mouth sores  muscle cramps, stiffness, or weakness  redness, blistering, peeling or loosening of the skin, including inside the mouth  trouble passing urine or change in the amount of urine Side effects that usually do not require medical attention (report to your doctor or health care professional if they continue or are bothersome):  bone, joint, or muscle pain  constipation  diarrhea  fever  hair loss  irritation at site where injected  loss of appetite  nausea, vomiting  stomach upset  trouble sleeping  trouble swallowing  weak or tired This list may not describe all possible side effects. Call your doctor for medical advice about side effects. You may report side effects to FDA at 1-800-FDA-1088. Where should I keep my medicine? This drug is given in a hospital or clinic and will not be stored at home. NOTE: This sheet is a summary. It may not cover all possible information. If you have questions about this medicine, talk to your doctor, pharmacist, or health care provider.  2020 Elsevier/Gold Standard (2014-03-15 14:19:39)  

## 2021-06-14 ENCOUNTER — Ambulatory Visit: Payer: Medicare Other | Admitting: Dermatology

## 2021-06-14 ENCOUNTER — Other Ambulatory Visit: Payer: Self-pay

## 2021-06-14 DIAGNOSIS — L578 Other skin changes due to chronic exposure to nonionizing radiation: Secondary | ICD-10-CM

## 2021-06-14 DIAGNOSIS — D229 Melanocytic nevi, unspecified: Secondary | ICD-10-CM

## 2021-06-14 DIAGNOSIS — L821 Other seborrheic keratosis: Secondary | ICD-10-CM | POA: Diagnosis not present

## 2021-06-14 DIAGNOSIS — L814 Other melanin hyperpigmentation: Secondary | ICD-10-CM | POA: Diagnosis not present

## 2021-06-14 DIAGNOSIS — D1800 Hemangioma unspecified site: Secondary | ICD-10-CM | POA: Diagnosis not present

## 2021-06-14 DIAGNOSIS — Z1283 Encounter for screening for malignant neoplasm of skin: Secondary | ICD-10-CM | POA: Diagnosis not present

## 2021-06-14 DIAGNOSIS — Z85828 Personal history of other malignant neoplasm of skin: Secondary | ICD-10-CM

## 2021-06-14 DIAGNOSIS — I8310 Varicose veins of unspecified lower extremity with inflammation: Secondary | ICD-10-CM | POA: Diagnosis not present

## 2021-06-14 NOTE — Patient Instructions (Signed)

## 2021-06-14 NOTE — Progress Notes (Signed)
   Follow-Up Visit   Subjective  Jessica Glenn is a 87 y.o. female who presents for the following: Annual Exam (Hx BCC, SCC ). The patient presents for Total-Body Skin Exam (TBSE) for skin cancer screening and mole check.  The following portions of the chart were reviewed this encounter and updated as appropriate:   Tobacco  Allergies  Meds  Problems  Med Hx  Surg Hx  Fam Hx     Review of Systems:  No other skin or systemic complaints except as noted in HPI or Assessment and Plan.  Objective  Well appearing patient in no apparent distress; mood and affect are within normal limits.  A full examination was performed including scalp, head, eyes, ears, nose, lips, neck, chest, axillae, abdomen, back, buttocks, bilateral upper extremities, bilateral lower extremities, hands, feet, fingers, toes, fingernails, and toenails. All findings within normal limits unless otherwise noted below.  L nose Clear.  Assessment & Plan  History of basal cell carcinoma (BCC) of skin L nose  Clear. Observe for recurrence. Call clinic for new or changing lesions.  Recommend regular skin exams, daily broad-spectrum spf 30+ sunscreen use, and photoprotection.     Lentigines - Scattered tan macules - Due to sun exposure - Benign-appering, observe - Recommend daily broad spectrum sunscreen SPF 30+ to sun-exposed areas, reapply every 2 hours as needed. - Call for any changes  Seborrheic Keratoses - Stuck-on, waxy, tan-brown papules and/or plaques  - Benign-appearing - Discussed benign etiology and prognosis. - Observe - Call for any changes  Melanocytic Nevi - Tan-brown and/or pink-flesh-colored symmetric macules and papules - Benign appearing on exam today - Observation - Call clinic for new or changing moles - Recommend daily use of broad spectrum spf 30+ sunscreen to sun-exposed areas.   Hemangiomas - Red papules - Discussed benign nature - Observe - Call for any changes  Actinic  Damage - Chronic condition, secondary to cumulative UV/sun exposure - diffuse scaly erythematous macules with underlying dyspigmentation - Recommend daily broad spectrum sunscreen SPF 30+ to sun-exposed areas, reapply every 2 hours as needed.  - Staying in the shade or wearing long sleeves, sun glasses (UVA+UVB protection) and wide brim hats (4-inch brim around the entire circumference of the hat) are also recommended for sun protection.  - Call for new or changing lesions.  History of Squamous Cell Carcinoma of the Skin - L pretibia - No evidence of recurrence today - No lymphadenopathy - Recommend regular full body skin exams - Recommend daily broad spectrum sunscreen SPF 30+ to sun-exposed areas, reapply every 2 hours as needed.  - Call if any new or changing lesions are noted between office visits  Varicose Veins/Spider Veins - Dilated blue, purple or red veins at the lower extremities - Reassured - Smaller vessels can be treated by sclerotherapy (a procedure to inject a medicine into the veins to make them disappear) if desired, but the treatment is not covered by insurance. Larger vessels may be covered if symptomatic and we would refer to vascular surgeon if treatment desired.  Skin cancer screening performed today.  Return in about 1 year (around 06/14/2022) for TBSE.  Luther Redo, CMA, am acting as scribe for Sarina Ser, MD . Documentation: I have reviewed the above documentation for accuracy and completeness, and I agree with the above.  Sarina Ser, MD

## 2021-06-16 ENCOUNTER — Encounter: Payer: Self-pay | Admitting: Dermatology

## 2021-07-28 DIAGNOSIS — E7800 Pure hypercholesterolemia, unspecified: Secondary | ICD-10-CM | POA: Diagnosis not present

## 2021-07-28 DIAGNOSIS — G4700 Insomnia, unspecified: Secondary | ICD-10-CM | POA: Diagnosis not present

## 2021-07-28 DIAGNOSIS — Z1389 Encounter for screening for other disorder: Secondary | ICD-10-CM | POA: Diagnosis not present

## 2021-07-28 DIAGNOSIS — Z0000 Encounter for general adult medical examination without abnormal findings: Secondary | ICD-10-CM | POA: Diagnosis not present

## 2021-07-28 DIAGNOSIS — I700 Atherosclerosis of aorta: Secondary | ICD-10-CM | POA: Diagnosis not present

## 2021-07-28 DIAGNOSIS — Z6824 Body mass index (BMI) 24.0-24.9, adult: Secondary | ICD-10-CM | POA: Diagnosis not present

## 2021-07-28 DIAGNOSIS — C773 Secondary and unspecified malignant neoplasm of axilla and upper limb lymph nodes: Secondary | ICD-10-CM | POA: Diagnosis not present

## 2021-07-28 DIAGNOSIS — M8588 Other specified disorders of bone density and structure, other site: Secondary | ICD-10-CM | POA: Diagnosis not present

## 2021-07-28 DIAGNOSIS — B356 Tinea cruris: Secondary | ICD-10-CM | POA: Diagnosis not present

## 2021-07-28 DIAGNOSIS — R030 Elevated blood-pressure reading, without diagnosis of hypertension: Secondary | ICD-10-CM | POA: Diagnosis not present

## 2021-08-31 DIAGNOSIS — H60391 Other infective otitis externa, right ear: Secondary | ICD-10-CM | POA: Diagnosis not present

## 2021-08-31 DIAGNOSIS — H6123 Impacted cerumen, bilateral: Secondary | ICD-10-CM | POA: Diagnosis not present

## 2021-09-20 ENCOUNTER — Other Ambulatory Visit: Payer: Self-pay | Admitting: Oncology

## 2021-10-29 ENCOUNTER — Other Ambulatory Visit: Payer: Self-pay | Admitting: Radiation Therapy

## 2021-10-29 DIAGNOSIS — C7951 Secondary malignant neoplasm of bone: Secondary | ICD-10-CM

## 2021-11-19 ENCOUNTER — Other Ambulatory Visit: Payer: Self-pay

## 2021-12-06 ENCOUNTER — Other Ambulatory Visit: Payer: Self-pay

## 2021-12-06 DIAGNOSIS — C50812 Malignant neoplasm of overlapping sites of left female breast: Secondary | ICD-10-CM

## 2021-12-06 DIAGNOSIS — Z170 Estrogen receptor positive status [ER+]: Secondary | ICD-10-CM

## 2021-12-07 ENCOUNTER — Inpatient Hospital Stay: Payer: Medicare Other | Admitting: Hematology and Oncology

## 2021-12-07 ENCOUNTER — Inpatient Hospital Stay: Payer: Medicare Other | Attending: Hematology and Oncology

## 2021-12-07 ENCOUNTER — Inpatient Hospital Stay: Payer: Medicare Other

## 2021-12-07 ENCOUNTER — Other Ambulatory Visit: Payer: Self-pay

## 2021-12-07 ENCOUNTER — Encounter: Payer: Self-pay | Admitting: Hematology and Oncology

## 2021-12-07 VITALS — BP 184/81 | HR 77 | Temp 97.7°F | Resp 18 | Ht 65.0 in | Wt 159.9 lb

## 2021-12-07 DIAGNOSIS — Z79811 Long term (current) use of aromatase inhibitors: Secondary | ICD-10-CM | POA: Diagnosis not present

## 2021-12-07 DIAGNOSIS — C50812 Malignant neoplasm of overlapping sites of left female breast: Secondary | ICD-10-CM

## 2021-12-07 DIAGNOSIS — C7951 Secondary malignant neoplasm of bone: Secondary | ICD-10-CM

## 2021-12-07 DIAGNOSIS — Z170 Estrogen receptor positive status [ER+]: Secondary | ICD-10-CM | POA: Diagnosis not present

## 2021-12-07 DIAGNOSIS — C50912 Malignant neoplasm of unspecified site of left female breast: Secondary | ICD-10-CM | POA: Diagnosis not present

## 2021-12-07 LAB — CBC WITH DIFFERENTIAL (CANCER CENTER ONLY)
Abs Immature Granulocytes: 0.01 10*3/uL (ref 0.00–0.07)
Basophils Absolute: 0 10*3/uL (ref 0.0–0.1)
Basophils Relative: 1 %
Eosinophils Absolute: 0.1 10*3/uL (ref 0.0–0.5)
Eosinophils Relative: 2 %
HCT: 41.8 % (ref 36.0–46.0)
Hemoglobin: 13.7 g/dL (ref 12.0–15.0)
Immature Granulocytes: 0 %
Lymphocytes Relative: 27 %
Lymphs Abs: 1.6 10*3/uL (ref 0.7–4.0)
MCH: 29.8 pg (ref 26.0–34.0)
MCHC: 32.8 g/dL (ref 30.0–36.0)
MCV: 90.9 fL (ref 80.0–100.0)
Monocytes Absolute: 0.5 10*3/uL (ref 0.1–1.0)
Monocytes Relative: 7 %
Neutro Abs: 3.8 10*3/uL (ref 1.7–7.7)
Neutrophils Relative %: 63 %
Platelet Count: 145 10*3/uL — ABNORMAL LOW (ref 150–400)
RBC: 4.6 MIL/uL (ref 3.87–5.11)
RDW: 12.5 % (ref 11.5–15.5)
WBC Count: 6.1 10*3/uL (ref 4.0–10.5)
nRBC: 0 % (ref 0.0–0.2)

## 2021-12-07 LAB — CMP (CANCER CENTER ONLY)
ALT: 8 U/L (ref 0–44)
AST: 16 U/L (ref 15–41)
Albumin: 4.6 g/dL (ref 3.5–5.0)
Alkaline Phosphatase: 45 U/L (ref 38–126)
Anion gap: 8 (ref 5–15)
BUN: 25 mg/dL — ABNORMAL HIGH (ref 8–23)
CO2: 28 mmol/L (ref 22–32)
Calcium: 9.9 mg/dL (ref 8.9–10.3)
Chloride: 104 mmol/L (ref 98–111)
Creatinine: 1.08 mg/dL — ABNORMAL HIGH (ref 0.44–1.00)
GFR, Estimated: 49 mL/min — ABNORMAL LOW (ref >60)
Glucose, Bld: 97 mg/dL (ref 70–99)
Potassium: 4.1 mmol/L (ref 3.5–5.1)
Sodium: 140 mmol/L (ref 135–145)
Total Bilirubin: 0.5 mg/dL (ref 0.3–1.2)
Total Protein: 7.3 g/dL (ref 6.5–8.1)

## 2021-12-07 MED ORDER — ZOLEDRONIC ACID 4 MG/5ML IV CONC: 4.0000 mg | Freq: Once | INTRAVENOUS | Status: DC

## 2021-12-07 MED ORDER — SODIUM CHLORIDE 0.9 % IV SOLN: Freq: Once | INTRAVENOUS | Status: AC

## 2021-12-07 MED ORDER — ZOLEDRONIC ACID 4 MG/100ML IV SOLN
4.0000 mg | Freq: Once | INTRAVENOUS | Status: AC
  Administered 2021-12-07: 4 mg via INTRAVENOUS
  Filled 2021-12-07: qty 100

## 2021-12-07 NOTE — Progress Notes (Signed)
Jessica Glenn  [phone] [phone]     ID: Jessica Glenn DOB: 06-14-1929  MR#: [id number]  WTU#:[id number]  Patient Care Team: Jessica Dus, MD as PCP - General (Family Medicine) Glenn, Jessica Dad, MD (Inactive) as Consulting Physician (Oncology) Jessica Kussmaul, MD as Consulting Physician (General Surgery) Jessica Rudd, MD as Consulting Physician (Radiation Oncology) OTHER MD:  CHIEF COMPLAINT: Locally advanced/metastatic estrogen receptor positive breast cancer (s/p left mastectomy)  CURRENT TREATMENT: Anastrozole; zoledronate  INTERVAL HISTORY:  Jessica Glenn returns today for follow-up of her metastatic breast cancer.  She continues on anastrozole.  She has no hot flashes or vaginal dryness that she notes.  She denies any new bone pains, change in breathing, bowel habits.  No new neurological complaints She does have ongoing issues with insomnia, previously was using Tylenol PM and as needed Ambien but she struggles to have a good quality of sleep. She receives Zometa every 6 months.  She has repeat imaging of thoracic spine in March 2023. Last mammogram in August 2022 was unremarkable. Rest of the pertinent 10 point ROS reviewed and negative.  COVID 19 VACCINATION STATUS:   Refuses vaccination  HISTORY OF CURRENT ILLNESS: From the original intake note:  Jessica Glenn tells me she first noted a changes in her left breast about a year and a half ago. She had had some dental work around that time and thought possibly that could be related. She said her breast felt "like steel". She did not bring it to medical attention until her recent appointment with Dr. Alyson Glenn and he set her up for bilateral diagnostic mammography with tomography and left breast ultrasonography at the [facility] 06/19/2017. This found the breast density to be category C. In the upper outer retroareolar left breast there was an irregular mass estimated to be at least 6 cm by mammography. There  were heterogeneous calcifications within the mass. There was skin thickening of the areola and an enlarged inferior left axillary lymph node was noted. On exam there was a large fixed hard mass in the upper outer quadrant of the left breast causing protrusion of the areola. It measured approximately 7 cm by palpation and there was visible skin thickening. In the inferior left axilla there was a firm palpable mass measuring 2 cm.  Ultrasound confirmed an irregular hypoechoic vascular mass in the left breast centered on the 1:00 position 4 cm from the nipple measuring 6.7 cm. This was abutting the overlying skin. It was a 2.3 cm hypoechoic vascular mass in the left axilla. The right breast was unremarkable.  On 06/22/2017 the patient had left breast and left axillary node biopsy, showing ([id number]) both sites to be involved by invasive ductal carcinoma, grade 2, with prognostic panels on both biopsies showing estrogen receptor 100% positivity, progesterone receptor 40-50% positivity, all with strong staining intensity, MIB-1:30 percent in the breast and 15% in the lymph nodes, and both HER-2 negative, with ratios of 0.76-0.85 and number per cell 1.10-1.30.  The patient's subsequent history is as detailed below.   PAST MEDICAL HISTORY: Past Medical History:  Diagnosis Date   Basal cell carcinoma    s/p Moh's surgery  to left side of nose   Breast cancer, left breast ([name]) dx'd 05/2017   History of tobacco use    Squamous cell carcinoma of skin 11/18/2019   Left pretibial. KA-type. EDC   Varicose vein of leg    BLE    PAST SURGICAL HISTORY: Past Surgical History:  Procedure  Laterality Date   BREAST BIOPSY Left 05/2017   CATARACT EXTRACTION W/ INTRAOCULAR LENS  IMPLANT, BILATERAL Bilateral    DILATION AND CURETTAGE OF UTERUS     FRACTURE SURGERY     HARDWARE REMOVAL Right 02/14/2017   Procedure: RIGHT ANKLE HARDWARE REMOVAL;  Surgeon: Jessica Butters, MD;  Location: [facility];  Service: Orthopedics;  Laterality: Right;   I & D EXTREMITY Right 02/14/2017   Procedure: RIGHT IRRIGATION AND DEBRIDEMENT ANKLE;  Surgeon: Jessica Butters, MD;  Location: [facility];  Service: Orthopedics;  Laterality: Right;   MASTECTOMY Left 02/14/2018   LEFT MASTECTOMY WITH DEEP LEFT AXILLARY SENTINEL LYMPH NODE BIOPSY    MASTECTOMY W/ SENTINEL NODE BIOPSY Left 02/14/2018   Procedure: LEFT MASTECTOMY WITH SENTINEL LYMPH NODE BIOPSY AND POSSIBLE NODE DISSECTION;  Surgeon: Jessica Kussmaul, MD;  Location: [facility];  Service: General;  Laterality: Left;   MOHS SURGERY Left    "side of nose"   ORIF ANKLE FRACTURE Right 02/02/2016   Procedure: OPEN REDUCTION INTERNAL FIXATION (ORIF) ANKLE FRACTURE;  Surgeon: Jessica Butters, MD;  Location: [facility];  Service: Orthopedics;  Laterality: Right;  strykermini c armreg bedbone foam    FAMILY HISTORY Family History  Problem Relation Age of Onset   COPD Mother    Emphysema Mother   The patient's father had a history of alcoholism and a band in the family. The patient has no information on the paternal side. The patient's mother died from emphysema at the age of 75. The patient has one sister, 2 brothers. There is no history of breast or ovarian cancer in the family to her knowledge.   GYNECOLOGIC HISTORY:  No LMP recorded. Patient is postmenopausal. Menarche age 62, first live birth age 53, the patient is [place] P4. She went through the change of life in her 67s. She took hormone replacement for a few years.    SOCIAL HISTORY:  The patient is originally from [place]. Her husband Jessica Glenn is a traveling Theme park manager, nondenominational. Son Jessica Glenn lives in [place] where he works as a Theme park manager; son Jessica Glenn lives in [place] where he works as an Optometrist; son Jessica Glenn lives in [place] where he works as a Chief Strategy Officer; son Jessica Glenn also lives in [place] and works as a Chief Strategy Officer. The patient has 14 grandchildren and 28  great-grandchildren    ADVANCED DIRECTIVES: In place   HEALTH MAINTENANCE: Social History   Tobacco Use   Smoking status: Former    Packs/day: 1.00    Years: 8.00    Pack years: 8.00    Types: Cigarettes    Quit date: 1960    Years since quitting: 63.1   Smokeless tobacco: Never  Vaping Use   Vaping Use: Never used  Substance Use Topics   Alcohol use: Yes    Alcohol/week: 5.0 standard drinks    Types: 5 Glasses of wine per week   Drug use: No     Colonoscopy: Never  PAP:  Bone density: Osteopenia   No Known Allergies  Current Outpatient Medications  Medication Sig Dispense Refill   anastrozole (ARIMIDEX) 1 MG tablet TAKE 1 TABLET BY MOUTH DAILY 90 tablet 4   ascorbic acid (VITAMIN C) 1000 MG tablet Take 1,000 mg by mouth daily.     Calcium Carb-Cholecalciferol (CALCIUM 600 + D PO) Take 1 tablet by mouth daily.     Cholecalciferol (VITAMIN D3) 3000 units TABS Take 3,000 Units by mouth daily.  Cyanocobalamin (B-12) 5000 MCG CAPS Take 5,000 mcg by mouth daily.     magnesium oxide (MAG-OX) 400 (241.3 Mg) MG tablet Take 1 tablet (400 mg total) by mouth daily.     melatonin 3 MG TABS tablet Take 3 mg by mouth at bedtime.     Multiple Vitamin (MULTIVITAMIN WITH MINERALS) TABS tablet Take 1 tablet by mouth daily.     thiamine (VITAMIN B-1) 50 MG tablet Take 50 mg by mouth daily.     No current facility-administered medications for this visit.    OBJECTIVE: White woman who appears well  Vitals:   12/07/21 1431  BP: (!) 184/81  Pulse: 77  Resp: 18  Temp: 97.7 F (36.5 C)  SpO2: 98%    Wt Readings from Last 3 Encounters:  12/07/21 159 lb 14.4 oz (72.5 kg)  06/03/21 152 lb 12.8 oz (69.3 kg)  09/30/20 165 lb 9.6 oz (75.1 kg)   Body mass index is 26.61 kg/m.    ECOG FS:1 - Symptomatic but completely ambulatory GENERAL: Patient is a well appearing female in no acute distress HEENT:  Sclerae anicteric.  Oropharynx clear and moist. No ulcerations or evidence of  oropharyngeal candidiasis. Neck is supple.  NODES:  No cervical, supraclavicular, or axillary lymphadenopathy palpated.  BREAST EXAM:  left breast s/p mastectomy, no sign of local recurrence right breast benign LUNGS:  Clear to auscultation bilaterally.  No wheezes or rhonchi. HEART:  Regular rate and rhythm. No murmur appreciated. ABDOMEN:  Soft, nontender.  Positive, normoactive bowel sounds. No organomegaly palpated. MSK:  No focal spinal tenderness to palpation. Full range of motion bilaterally in the upper extremities. EXTREMITIES:  No peripheral edema.   SKIN:  Clear with no obvious rashes or skin changes. No nail dyscrasia. NEURO:  Nonfocal. Well oriented.  Appropriate affect.  LAB RESULTS  CMP     Component Value Date/Time   NA 140 12/07/2021 1415   NA 140 10/27/2017 0908   K 4.1 12/07/2021 1415   K 4.8 10/27/2017 0908   CL 104 12/07/2021 1415   CO2 28 12/07/2021 1415   CO2 27 10/27/2017 0908   GLUCOSE 97 12/07/2021 1415   GLUCOSE 96 10/27/2017 0908   BUN 25 (H) 12/07/2021 1415   BUN 20.5 10/27/2017 0908   CREATININE 1.08 (H) 12/07/2021 1415   CREATININE 0.9 10/27/2017 0908   CALCIUM 9.9 12/07/2021 1415   CALCIUM 9.7 10/27/2017 0908   PROT 7.3 12/07/2021 1415   PROT 7.2 10/27/2017 0908   ALBUMIN 4.6 12/07/2021 1415   ALBUMIN 4.2 10/27/2017 0908   AST 16 12/07/2021 1415   AST 19 10/27/2017 0908   ALT 8 12/07/2021 1415   ALT 8 10/27/2017 0908   ALKPHOS 45 12/07/2021 1415   ALKPHOS 58 10/27/2017 0908   BILITOT 0.5 12/07/2021 1415   BILITOT 0.62 10/27/2017 0908   GFRNONAA 49 (L) 12/07/2021 1415   GFRAA >60 07/15/2020 0847    No results found for: TOTALPROTELP, ALBUMINELP, A1GS, A2GS, BETS, BETA2SER, GAMS, MSPIKE, SPEI  No results found for: KPAFRELGTCHN, LAMBDASER, KAPLAMBRATIO  Lab Results  Component Value Date   WBC 6.1 12/07/2021   NEUTROABS 3.8 12/07/2021   HGB 13.7 12/07/2021   HCT 41.8 12/07/2021   MCV 90.9 12/07/2021   PLT 145 (L) 12/07/2021    Lab Results  Component Value Date   CA2729 61.6 (H) 07/05/2018    Urinalysis No results found for: COLORURINE, APPEARANCEUR, LABSPEC, PHURINE, GLUCOSEU, HGBUR, BILIRUBINUR, KETONESUR, PROTEINUR, UROBILINOGEN, NITRITE, LEUKOCYTESUR   STUDIES:  No results found.   ELIGIBLE FOR AVAILABLE RESEARCH PROTOCOL: no  ASSESSMENT: 88 y.o. [place] woman status post left breast overlapping sites biopsy 06/22/2017 for a clinical T3 N1-2, stage II-III invasive ductal carcinoma, grade 2, estrogen and progesterone receptor positive, HER-2 nonamplified, with an MIB-1 of 30%  (a) staging CT of the chest and bone survey and bone scan July 11, 2017 showed multiple indeterminant subcentimeter pulmonary nodules which will require follow-up, as well as an indeterminate right hepatic lobe lesion, but no definite evidence of malignancy.  (b) CA-27-29 is informative  (1) neo-adjuvant fulvestrant started on 07/05/2017, discontinued 12/20/2017 with possible progression  (2) status post left mastectomy and sentinel lymph node sampling 02/14/2018 for a T3 N1, stage IIA invasive ductal carcinoma, grade 1, with negative margins.  Repeat prognostic panel again estrogen and progesterone receptor positive, HER-2 negative  (a) 2 of 7 removed lymph nodes were positive  (3) adjuvant radiation to follow surgery  (4) anastrozole started 01/17/2018  (a) start palbociclib once disease progression is documented  (5) METASTATIC DISEASE: April 2019  (a) CT scan of the chest 02/05/2018 shows a new lesion at T2  (b) total spinal MRI 03/14/2018 confirms lesion at T2 and possible minimal lesion at T8, neither amenable to biopsy; there are no other suspicious lesions  (c) total spinal MRI 07/02/2020 showed the treated lesion at T2, stable abnormal enhancement at T8 and perhaps slightly increased at T9.  There was resolution of enhancement at T10, otherwise some evidence of degenerative disease.  Cervical and lumbar  spines were negative  (d) chest CT on 902 2021 shows stable small pulmonary nodules, mild bronchiectasis, and aortic atherosclerosis but no measurable disease.  (6)  adjuvant radiation 05/01/2018 -06/15/2018  (a) received capecitabine sensitization given the extent of local disease  (b) Site/dose:   The patient initially received a dose of 50.4 Gy in 28 fractions to the left chest wall and supraclavicular region. This was delivered using a 3-D conformal, 4 field technique. The patient then received a boost to the mastectomy scar. This delivered an additional 10 Gy in 5 fractions using an en face electron field. The total dose was 60.4 Gy  (c) stereotactic radiosurgery to T2 given 07/05/2018  (7) started zolendronate 07/11/2018, to be repeated every 12 weeks  (a) bone density 10/10/2019 shows a T score of -2.0  (b) bone density December 2022  (c) zolendronate changed to every 6 months after the 09/30/2020 dose   PLAN:  Jashay is here today for f/u of her metastatic breast cancer.   She has no clinical sign of progression of her cancer.   She continues on Anastrozole with good tolerance and will continue this.   She is also receiving Zometa every 6 months and will proceed with this today.  She has follow-up MRI thoracic spine March 2023. At this time we would recommend return to clinic in about 6 months.  She is almost 80, has been doing really well on the current plan and has no evidence of clinical progression.  If she were ever to progress, we could try a low-dose Ibrance in addition to Arimidex. She will be about 5 years from her treatment in May 2024.  If she has no clinical evidence of progression at that time, we can consider continuing Arimidex for a couple more years and following up with her every 6 months to a year.    Total encounter time 30 minutes.* in face to face visit time, lab review, chart review,  order entry and documentation of the encounter.   *Total Encounter Time as  defined by the Centers for Medicare and Medicaid Services includes, in addition to the face-to-face time of a patient visit (documented in the note above) non-face-to-face time: obtaining and reviewing outside history, ordering and reviewing medications, tests or procedures, care coordination (communications with other health care professionals or caregivers) and documentation in the medical record.

## 2021-12-07 NOTE — Patient Instructions (Signed)

## 2022-01-20 ENCOUNTER — Ambulatory Visit (HOSPITAL_COMMUNITY): Payer: Medicare Other

## 2022-01-24 ENCOUNTER — Inpatient Hospital Stay: Payer: Medicare Other

## 2022-01-24 ENCOUNTER — Ambulatory Visit: Payer: Medicare Other | Admitting: Radiation Oncology

## 2022-01-27 ENCOUNTER — Ambulatory Visit (HOSPITAL_COMMUNITY)
Admission: RE | Admit: 2022-01-27 | Discharge: 2022-01-27 | Disposition: A | Discharge status: Home / Self Care | Payer: Medicare Other | Source: Ambulatory Visit | Attending: Radiation Oncology | Admitting: Radiation Oncology

## 2022-01-27 DIAGNOSIS — C7951 Secondary malignant neoplasm of bone: Secondary | ICD-10-CM | POA: Diagnosis not present

## 2022-01-27 DIAGNOSIS — Z853 Personal history of malignant neoplasm of breast: Secondary | ICD-10-CM | POA: Diagnosis not present

## 2022-01-27 DIAGNOSIS — M5134 Other intervertebral disc degeneration, thoracic region: Secondary | ICD-10-CM | POA: Diagnosis not present

## 2022-01-27 MED ORDER — GADOBUTROL 1 MMOL/ML IV SOLN
7.0000 mL | Freq: Once | INTRAVENOUS | Status: AC | PRN
  Administered 2022-01-27: 7 mL via INTRAVENOUS

## 2022-01-31 ENCOUNTER — Telehealth: Payer: Self-pay

## 2022-01-31 ENCOUNTER — Inpatient Hospital Stay: Payer: Medicare Other | Attending: Hematology and Oncology

## 2022-01-31 ENCOUNTER — Encounter: Payer: Self-pay | Admitting: Radiation Oncology

## 2022-01-31 ENCOUNTER — Ambulatory Visit
Admission: RE | Admit: 2022-01-31 | Discharge: 2022-01-31 | Disposition: A | Discharge status: Home / Self Care | Payer: Medicare Other | Source: Ambulatory Visit | Attending: Radiation Oncology | Admitting: Radiation Oncology

## 2022-01-31 DIAGNOSIS — C7951 Secondary malignant neoplasm of bone: Secondary | ICD-10-CM | POA: Diagnosis not present

## 2022-01-31 DIAGNOSIS — Z170 Estrogen receptor positive status [ER+]: Secondary | ICD-10-CM | POA: Diagnosis not present

## 2022-01-31 DIAGNOSIS — C50412 Malignant neoplasm of upper-outer quadrant of left female breast: Secondary | ICD-10-CM

## 2022-01-31 DIAGNOSIS — C50812 Malignant neoplasm of overlapping sites of left female breast: Secondary | ICD-10-CM

## 2022-01-31 NOTE — Progress Notes (Signed)
Spoke w/ patient, verified identity, and began nursing interview by phone. Patient is doing well. No issues reported at this time. ? ?Meaningful use complete. ?Postmenopausal- No chances of pregnancy. ? ?Patient reminded of her 2:00pm-01/31/22 telephone follow-up appointment w/ Shona Simpson PA-C. I left my extension [phone] in case patient needs anything. Patient verbalized understanding of information. ? ?Patient contact [phone] ?

## 2022-01-31 NOTE — Progress Notes (Signed)
?Radiation Oncology         [phone] ?________________________________ ? ?Outpatient Follow Up - Conducted via telephone due to current COVID-19 concerns for limiting patient exposure ? ?I spoke with the patient to conduct this visit via telephone to spare the patient unnecessary potential exposure in the healthcare setting during the current COVID-19 pandemic. The patient was notified in advance and was offered a [facility] meeting to allow for face to face communication but unfortunately reported that they did not have the appropriate resources/technology to support such a visit and instead preferred to proceed with a telephone visit. ? ?________________________________ ? ?Name: Jessica Glenn MRN: [id number]  ?Date of Service: 01/31/2022  DOB: 1929/08/02 ? ?Follow Up Note ? ?CC: Maury Dus, MD  Maury Dus, MD ? ?Diagnosis: Progressive metastatic stage II-III, T3 N0 M1 grade 1 ER/PR Positive invasive ductal carcinoma of the left breast with a T2 lesion ? ?Interval Since Last Radiation:   ? ?07/05/2018 Stereotactic Radiosurgery: ?T2 Spine was treated to 18 Gy in 1 fraction ? ?05/01/2018 - 06/15/2018: ?The patient initially received a dose of 50.4 Gy in 28 fractions to the left chest wall and supraclavicular region. This was delivered using a 3-D conformal, 4 field technique. The patient then received a boost to the mastectomy scar. This delivered an additional 10 Gy in 5 fractions using an en face electron field. The total dose was 60.4 Gy. ? ?Narrative: In summary the patient was diagnosed with stage II or stage III breast disease initially, and on staging imaging was found to have a lytic appearing lesion at T2.  She went on to proceed with mastectomy of the left breast followed by postmastectomy radiation to the left chest wall and regional nodes.  She then went on to receive stereotactic radiosurgery to the T-spine and one fraction.  She is followed in surveillance and she has been NED.  Of note some  degenerative changes along T8-9 have been identified in her scans and felt to be stable over time since her treatment.  She has decided that she would prefer imaging serially at annual intervals rather than every 6 months as has been previously offered.  Her most recent MRI of the thoracic spine with and without contrast on 01/27/2022 showed unchanged appearance of the T2 lesion without concern for active disease.  Hemangiomatous change at T8 and nodular enhancement of the ligamentum flavum on the left of the T8-9 level is also unchanged without evidence of fracture.  No new nodules within the thoracic spine were identified or other areas concerning for fracture.  She is contacted today by phone to review these results. ? ?              ?On review of systems, the patient reports she is doing very well overall.  She denies any pain in her back, difficulty with movement or walking.  She is not experiencing any numbness or tingling in the trunk or loss of sensation to light touch.  No other complaints are verbalized and she was able to enjoy her birthday gathering this year with her family. ? ?Past Medical History:  ?Past Medical History:  ?Diagnosis Date  ? Basal cell carcinoma   ? s/p Moh's surgery  to left side of nose  ? Breast cancer, left breast ([facility]) dx'd 05/2017  ? History of tobacco use   ? Squamous cell carcinoma of skin 11/18/2019  ? Left pretibial. KA-type. EDC  ? Varicose vein of leg   ? BLE  ? ? ?  Past Surgical History: ?Past Surgical History:  ?Procedure Laterality Date  ? BREAST BIOPSY Left 05/2017  ? CATARACT EXTRACTION W/ INTRAOCULAR LENS  IMPLANT, BILATERAL Bilateral   ? DILATION AND CURETTAGE OF UTERUS    ? FRACTURE SURGERY    ? HARDWARE REMOVAL Right 02/14/2017  ? Procedure: RIGHT ANKLE HARDWARE REMOVAL;  Surgeon: [name], MD;  Location: [facility];  Service: Orthopedics;  Laterality: Right;  ? I & D EXTREMITY Right 02/14/2017  ? Procedure: RIGHT IRRIGATION AND DEBRIDEMENT ANKLE;   Surgeon: [name], MD;  Location: [facility];  Service: Orthopedics;  Laterality: Right;  ? MASTECTOMY Left 02/14/2018  ? LEFT MASTECTOMY WITH DEEP LEFT AXILLARY SENTINEL LYMPH NODE BIOPSY   ? MASTECTOMY W/ SENTINEL NODE BIOPSY Left 02/14/2018  ? Procedure: LEFT MASTECTOMY WITH SENTINEL LYMPH NODE BIOPSY AND POSSIBLE NODE DISSECTION;  Surgeon: Jovita Kussmaul, MD;  Location: [facility];  Service: General;  Laterality: Left;  ? MOHS SURGERY Left   ? "side of nose"  ? ORIF ANKLE FRACTURE Right 02/02/2016  ? Procedure: OPEN REDUCTION INTERNAL FIXATION (ORIF) ANKLE FRACTURE;  Surgeon: [name], MD;  Location: [facility];  Service: Orthopedics;  Laterality: Right;  strykermini c armreg bedbone foam  ? ? ?Social History:  ?Social History  ? ?Socioeconomic History  ? Marital status: Widowed  ?  Spouse name: Not on file  ? Number of children: Not on file  ? Years of education: Not on file  ? Highest education level: Not on file  ?Occupational History  ? Not on file  ?Tobacco Use  ? Smoking status: Former  ?  Packs/day: 1.00  ?  Years: 8.00  ?  Pack years: 8.00  ?  Types: Cigarettes  ?  Quit date: 50  ?  Years since quitting: 63.2  ? Smokeless tobacco: Never  ?Vaping Use  ? Vaping Use: Never used  ?Substance and Sexual Activity  ? Alcohol use: Yes  ?  Alcohol/week: 5.0 standard drinks  ?  Types: 5 Glasses of wine per week  ? Drug use: No  ? Sexual activity: Not Currently  ?Other Topics Concern  ? Not on file  ?Social History Narrative  ? Not on file  ? ?Social Determinants of Health  ? ?Financial Resource Strain: Not on file  ?Food Insecurity: Not on file  ?Transportation Needs: Not on file  ?Physical Activity: Not on file  ?Stress: Not on file  ?Social Connections: Not on file  ?Intimate Partner Violence: Not on file  ?The patient is widowed.  She has 4 sons.   The patient lives in [place]. ? ?Family History: ?Family History  ?Problem Relation Age of Onset  ? COPD Mother   ?  Emphysema Mother   ? ? ? ?ALLERGIES:  has No Known Allergies. ? ?Meds: ?Current Outpatient Medications  ?Medication Sig Dispense Refill  ? anastrozole (ARIMIDEX) 1 MG tablet TAKE 1 TABLET BY MOUTH DAILY 90 tablet 4  ? ascorbic acid (VITAMIN C) 1000 MG tablet Take 1,000 mg by mouth daily.    ? Calcium Carb-Cholecalciferol (CALCIUM 600 + D PO) Take 1 tablet by mouth daily.    ? Cholecalciferol (VITAMIN D3) 3000 units TABS Take 3,000 Units by mouth daily.    ? Cyanocobalamin (B-12) 5000 MCG CAPS Take 5,000 mcg by mouth daily.    ? magnesium oxide (MAG-OX) 400 (241.3 Mg) MG tablet Take 1 tablet (400 mg total) by mouth daily.    ?  melatonin 3 MG TABS tablet Take 3 mg by mouth at bedtime.    ? Multiple Vitamin (MULTIVITAMIN WITH MINERALS) TABS tablet Take 1 tablet by mouth daily.    ? thiamine (VITAMIN B-1) 50 MG tablet Take 50 mg by mouth daily.    ? ?No current facility-administered medications for this encounter.  ? ? ?Physical Findings: ?Unable to assess due to nature of encounter  ? ?Lab Findings: ?Lab Results  ?Component Value Date  ? WBC 6.1 12/07/2021  ? HGB 13.7 12/07/2021  ? HCT 41.8 12/07/2021  ? MCV 90.9 12/07/2021  ? PLT 145 (L) 12/07/2021  ? ? ? ?Radiographic Findings: ?MR THORACIC SPINE W WO CONTRAST ? ?Result Date: 01/27/2022 ?CLINICAL DATA:  History of breast carcinoma, status post treatment. EXAM: MRI THORACIC WITHOUT AND WITH CONTRAST TECHNIQUE: Multiplanar and multiecho pulse sequences of the thoracic spine were obtained without and with intravenous contrast. CONTRAST:  35m GADAVIST GADOBUTROL 1 MMOL/ML IV SOLN COMPARISON:  01/21/2021 FINDINGS: Alignment:  Normal Vertebrae: Lesion within the dorsal aspect of T2 is unchanged. Unchanged appearance of hyperintense T1-weighted signal focus at T8 which is likely a hemangioma. A focus of nodular contrast enhancement along the surface of the ligamentum flavum, left asymmetric, at the T8-9 level is unchanged. There is no pathologic fracture. Cord:  Normal  signal and morphology. Paraspinal and other soft tissues: Negative. Disc levels: Unchanged appearance of multilevel degenerative disc disease with endplate changes. No spinal canal stenosis or neural impingement. I

## 2022-01-31 NOTE — Telephone Encounter (Signed)
Called x2 and reminded patient of her 2:00pm-01/31/22 telephone follow-up appointment w/ Shona Simpson PA-C. I left my extension [phone] and requested that patient return my call prior to 2pm, so that I may complete the nursing portion of this appointment. ?

## 2022-03-08 DIAGNOSIS — R350 Frequency of micturition: Secondary | ICD-10-CM | POA: Diagnosis not present

## 2022-03-08 DIAGNOSIS — L245 Irritant contact dermatitis due to other chemical products: Secondary | ICD-10-CM | POA: Diagnosis not present

## 2022-03-08 DIAGNOSIS — B372 Candidiasis of skin and nail: Secondary | ICD-10-CM | POA: Diagnosis not present

## 2022-04-08 ENCOUNTER — Telehealth: Payer: Self-pay | Admitting: Adult Health

## 2022-04-08 NOTE — Telephone Encounter (Signed)
Rescheduled appointment per provider template. Patient is aware of the changes made to her upcoming appointment 

## 2022-05-16 ENCOUNTER — Other Ambulatory Visit: Payer: Self-pay | Admitting: Family Medicine

## 2022-05-16 DIAGNOSIS — Z1231 Encounter for screening mammogram for malignant neoplasm of breast: Secondary | ICD-10-CM

## 2022-05-31 ENCOUNTER — Other Ambulatory Visit: Payer: Self-pay

## 2022-05-31 DIAGNOSIS — C50812 Malignant neoplasm of overlapping sites of left female breast: Secondary | ICD-10-CM

## 2022-06-02 ENCOUNTER — Other Ambulatory Visit: Payer: Medicare Other

## 2022-06-02 ENCOUNTER — Inpatient Hospital Stay (HOSPITAL_BASED_OUTPATIENT_CLINIC_OR_DEPARTMENT_OTHER): Payer: Medicare Other | Admitting: Adult Health

## 2022-06-02 ENCOUNTER — Inpatient Hospital Stay: Payer: Medicare Other | Attending: Hematology and Oncology

## 2022-06-02 ENCOUNTER — Ambulatory Visit: Payer: Medicare Other

## 2022-06-02 ENCOUNTER — Inpatient Hospital Stay: Payer: Medicare Other

## 2022-06-02 ENCOUNTER — Encounter: Payer: Self-pay | Admitting: Adult Health

## 2022-06-02 ENCOUNTER — Ambulatory Visit: Payer: Medicare Other | Admitting: Adult Health

## 2022-06-02 VITALS — BP 172/66 | HR 68 | Resp 18 | Ht 65.0 in | Wt 157.0 lb

## 2022-06-02 DIAGNOSIS — C50812 Malignant neoplasm of overlapping sites of left female breast: Secondary | ICD-10-CM

## 2022-06-02 DIAGNOSIS — C50912 Malignant neoplasm of unspecified site of left female breast: Secondary | ICD-10-CM

## 2022-06-02 DIAGNOSIS — C7951 Secondary malignant neoplasm of bone: Secondary | ICD-10-CM

## 2022-06-02 DIAGNOSIS — Z79811 Long term (current) use of aromatase inhibitors: Secondary | ICD-10-CM | POA: Insufficient documentation

## 2022-06-02 DIAGNOSIS — Z170 Estrogen receptor positive status [ER+]: Secondary | ICD-10-CM | POA: Diagnosis not present

## 2022-06-02 LAB — CBC WITH DIFFERENTIAL (CANCER CENTER ONLY)
Abs Immature Granulocytes: 0.02 10*3/uL (ref 0.00–0.07)
Basophils Absolute: 0 10*3/uL (ref 0.0–0.1)
Basophils Relative: 1 %
Eosinophils Absolute: 0.1 10*3/uL (ref 0.0–0.5)
Eosinophils Relative: 2 %
HCT: 40.5 % (ref 36.0–46.0)
Hemoglobin: 13.5 g/dL (ref 12.0–15.0)
Immature Granulocytes: 0 %
Lymphocytes Relative: 26 %
Lymphs Abs: 1.6 10*3/uL (ref 0.7–4.0)
MCH: 30.4 pg (ref 26.0–34.0)
MCHC: 33.3 g/dL (ref 30.0–36.0)
MCV: 91.2 fL (ref 80.0–100.0)
Monocytes Absolute: 0.6 10*3/uL (ref 0.1–1.0)
Monocytes Relative: 10 %
Neutro Abs: 3.8 10*3/uL (ref 1.7–7.7)
Neutrophils Relative %: 61 %
Platelet Count: 154 10*3/uL (ref 150–400)
RBC: 4.44 MIL/uL (ref 3.87–5.11)
RDW: 12.7 % (ref 11.5–15.5)
WBC Count: 6.2 10*3/uL (ref 4.0–10.5)
nRBC: 0 % (ref 0.0–0.2)

## 2022-06-02 LAB — CMP (CANCER CENTER ONLY)
ALT: 7 U/L (ref 0–44)
AST: 17 U/L (ref 15–41)
Albumin: 4.5 g/dL (ref 3.5–5.0)
Alkaline Phosphatase: 46 U/L (ref 38–126)
Anion gap: 7 (ref 5–15)
BUN: 25 mg/dL — ABNORMAL HIGH (ref 8–23)
CO2: 26 mmol/L (ref 22–32)
Calcium: 9.7 mg/dL (ref 8.9–10.3)
Chloride: 106 mmol/L (ref 98–111)
Creatinine: 0.94 mg/dL (ref 0.44–1.00)
GFR, Estimated: 57 mL/min — ABNORMAL LOW (ref >60)
Glucose, Bld: 94 mg/dL (ref 70–99)
Potassium: 4.6 mmol/L (ref 3.5–5.1)
Sodium: 139 mmol/L (ref 135–145)
Total Bilirubin: 0.5 mg/dL (ref 0.3–1.2)
Total Protein: 7.1 g/dL (ref 6.5–8.1)

## 2022-06-02 MED ORDER — ZOLEDRONIC ACID 4 MG/5ML IV CONC: 4.0000 mg | Freq: Once | INTRAVENOUS | Status: DC

## 2022-06-02 MED ORDER — SODIUM CHLORIDE 0.9 % IV SOLN: INTRAVENOUS | Status: DC

## 2022-06-02 MED ORDER — ZOLEDRONIC ACID 4 MG/100ML IV SOLN
4.0000 mg | Freq: Once | INTRAVENOUS | Status: AC
  Administered 2022-06-02: 4 mg via INTRAVENOUS
  Filled 2022-06-02: qty 100

## 2022-06-02 NOTE — Assessment & Plan Note (Signed)
Jessica Glenn is a 88 year old woman with estrogen positive metastatic breast cancer on treatment with Zometa and Anastrozole.  She has no clinical or radiographic sign of breast cancer progression.  She is tolerating her treatment well and will continue this.  She will continue annual imaging to evaluate her metastatic lesion's response to treatment in 12/2022.    We will see Jessica Glenn back in 6 months for labs, f/u, and her next Zometa.

## 2022-06-02 NOTE — Progress Notes (Signed)
[facility] Follow up:    Jessica Glenn, Jessica Glenn [id number]   DIAGNOSIS: metastatic breast cancer  SUMMARY OF ONCOLOGIC HISTORY:  [place] woman status post left breast overlapping sites biopsy 06/22/2017 for a clinical T3 N1-2, stage II-III invasive ductal carcinoma, grade 2, estrogen and progesterone receptor positive, HER-2 nonamplified, with an MIB-1 of 30%             (a) staging CT of the chest and bone survey and bone scan July 11, 2017 showed multiple indeterminant subcentimeter pulmonary nodules which will require follow-up, as well as an indeterminate right hepatic lobe lesion, but no definite evidence of malignancy.             (b) CA-27-29 is informative   (1) neo-adjuvant fulvestrant started on 07/05/2017, discontinued 12/20/2017 with possible progression   (2) status post left mastectomy and sentinel lymph node sampling 02/14/2018 for a T3 N1, stage IIA invasive ductal carcinoma, grade 1, with negative margins.  Repeat prognostic panel again estrogen and progesterone receptor positive, HER-2 negative             (a) 2 of 7 removed lymph nodes were positive   (3) adjuvant radiation to follow surgery   (4) anastrozole started 01/17/2018             (a) start palbociclib once disease progression is documented   (5) METASTATIC DISEASE: April 2019             (a) CT scan of the chest 02/05/2018 shows a new lesion at T2             (b) total spinal MRI 03/14/2018 confirms lesion at T2 and possible minimal lesion at T8, neither amenable to biopsy; there are no other suspicious lesions             (c) total spinal MRI 07/02/2020 showed the treated lesion at T2, stable abnormal enhancement at T8 and perhaps slightly increased at T9.  There was resolution of enhancement at T10, otherwise some evidence of degenerative disease.  Cervical and lumbar spines were negative             (d) chest CT on 902 2021 shows stable  small pulmonary nodules, mild bronchiectasis, and aortic atherosclerosis but no measurable disease.   (6)  adjuvant radiation 05/01/2018 -06/15/2018             (a) received capecitabine sensitization given the extent of local disease             (b) Site/dose:   The patient initially received a dose of 50.4 Gy in 28 fractions to the left chest wall and supraclavicular region. This was delivered using a 3-D conformal, 4 field technique. The patient then received a boost to the mastectomy scar. This delivered an additional 10 Gy in 5 fractions using an en face electron field. The total dose was 60.4 Gy             (c) stereotactic radiosurgery to T2 given 07/05/2018   (7) started zolendronate 07/11/2018, to be repeated every 12 weeks             (a) bone density 10/10/2019 shows a T score of -2.0             (b) bone density December 2022             (c) zolendronate changed to every 6 months after the 09/30/2020  dose  CURRENT THERAPY: Anastrozole; zometa  INTERVAL HISTORY: Jessica Glenn 88 y.o. female returns for f/u and evaluation of her ER positive metastatic breast cancer on treatment with Anastrozole and Zometa. She is tolerating her treatment well.  She denis any issues with taking it.  She has no new concerns today.  Jessica Glenn is scheduled for a mammogram on 06/16/2022.    Her most recent imaging was completed with MRI of the spine on 01/27/2022 showing:  IMPRESSION: 1. Unchanged examination without new metastatic lesions. 2. Unchanged appearance of treated metastasis at T2. Unchanged nodular contrast enhancement along the ligamentum flavum at T8-9. 3. No pathologic fracture or spinal canal stenosis.    Patient Active Problem List   Diagnosis Date Noted   Malignant neoplasm metastatic to bone ([place]) 07/17/2018   Aortic atherosclerosis ([place]) 03/30/2018   Malignant neoplasm of overlapping sites of left breast in female, estrogen receptor positive ([place]) 06/29/2017   Locally advanced carcinoma  of breast, left ([place]) 06/29/2017   Vitamin D insufficiency 01/28/2016   Ankle fracture, right 01/27/2016   Hyperglycemia 01/27/2016   Fall (on) (from) other stairs and steps, initial encounter 01/27/2016   History of basal cell carcinoma of skin 01/27/2016    has No Known Allergies.  MEDICAL HISTORY: Past Medical History:  Diagnosis Date   Basal cell carcinoma    s/p Moh's surgery  to left side of nose   Breast cancer, left breast ([place]) dx'd 05/2017   History of tobacco use    Squamous cell carcinoma of skin 11/18/2019   Left pretibial. KA-type. EDC   Varicose vein of leg    BLE    SURGICAL HISTORY: Past Surgical History:  Procedure Laterality Date   BREAST BIOPSY Left 05/2017   CATARACT EXTRACTION W/ INTRAOCULAR LENS  IMPLANT, BILATERAL Bilateral    DILATION AND CURETTAGE OF UTERUS     FRACTURE SURGERY     HARDWARE REMOVAL Right 02/14/2017   Procedure: RIGHT ANKLE HARDWARE REMOVAL;  Surgeon: [name], MD;  Location: [facility];  Service: Orthopedics;  Laterality: Right;   I & D EXTREMITY Right 02/14/2017   Procedure: RIGHT IRRIGATION AND DEBRIDEMENT ANKLE;  Surgeon: [name], MD;  Location: [facility];  Service: Orthopedics;  Laterality: Right;   MASTECTOMY Left 02/14/2018   LEFT MASTECTOMY WITH DEEP LEFT AXILLARY SENTINEL LYMPH NODE BIOPSY    MASTECTOMY W/ SENTINEL NODE BIOPSY Left 02/14/2018   Procedure: LEFT MASTECTOMY WITH SENTINEL LYMPH NODE BIOPSY AND POSSIBLE NODE DISSECTION;  Surgeon: Jovita Kussmaul, MD;  Location: [facility];  Service: General;  Laterality: Left;   MOHS SURGERY Left    "side of nose"   ORIF ANKLE FRACTURE Right 02/02/2016   Procedure: OPEN REDUCTION INTERNAL FIXATION (ORIF) ANKLE FRACTURE;  Surgeon: [name], MD;  Location: [facility];  Service: Orthopedics;  Laterality: Right;  strykermini c armreg bedbone foam    SOCIAL HISTORY: Social History   Socioeconomic History   Marital  status: Widowed    Spouse name: Not on file   Number of children: Not on file   Years of education: Not on file   Highest education level: Not on file  Occupational History   Not on file  Tobacco Use   Smoking status: Former    Packs/day: 1.00    Years: 8.00    Total pack years: 8.00    Types: Cigarettes    Quit date: 63    Years since quitting: 62.6  Smokeless tobacco: Never  Vaping Use   Vaping Use: Never used  Substance and Sexual Activity   Alcohol use: Yes    Alcohol/week: 5.0 standard drinks of alcohol    Types: 5 Glasses of wine per week   Drug use: No   Sexual activity: Not Currently  Other Topics Concern   Not on file  Social History Narrative   Not on file   Social Determinants of Health   Financial Resource Strain: Not on file  Food Insecurity: Not on file  Transportation Needs: Not on file  Physical Activity: Not on file  Stress: Not on file  Social Connections: Not on file  Intimate Partner Violence: Not At Risk (09/03/2018)   Humiliation, Afraid, Rape, and Kick questionnaire    Fear of Current or Ex-Partner: No    Emotionally Abused: No    Physically Abused: No    Sexually Abused: No    FAMILY HISTORY: Family History  Problem Relation Age of Onset   COPD Mother    Emphysema Mother     Review of Systems  Constitutional:  Negative for appetite change, chills, fatigue, fever and unexpected weight change.  HENT:   Negative for hearing loss, lump/mass and trouble swallowing.   Eyes:  Negative for eye problems and icterus.  Respiratory:  Negative for chest tightness, cough and shortness of breath.   Cardiovascular:  Negative for chest pain, leg swelling and palpitations.  Gastrointestinal:  Negative for abdominal distention, abdominal pain, constipation, diarrhea, nausea and vomiting.  Endocrine: Negative for hot flashes.  Genitourinary:  Negative for difficulty urinating.   Musculoskeletal:  Negative for arthralgias.  Skin:  Negative for  itching and rash.  Neurological:  Negative for dizziness, extremity weakness, headaches and numbness.  Hematological:  Negative for adenopathy. Does not bruise/bleed easily.  Psychiatric/Behavioral:  Negative for depression. The patient is not nervous/anxious.       PHYSICAL EXAMINATION  ECOG PERFORMANCE STATUS: 0 - Asymptomatic  Vitals:   06/02/22 1343  BP: (!) 172/66  Pulse: 68  Resp: 18  SpO2: 97%    Physical Exam Constitutional:      General: She is not in acute distress.    Appearance: Normal appearance. She is not toxic-appearing.  HENT:     Head: Normocephalic and atraumatic.  Eyes:     General: No scleral icterus. Cardiovascular:     Rate and Rhythm: Normal rate and regular rhythm.     Pulses: Normal pulses.     Heart sounds: Normal heart sounds.  Pulmonary:     Effort: Pulmonary effort is normal.     Breath sounds: Normal breath sounds.  Chest:     Comments: Left breast s/p mastectomy and radiation no sign of local recurrence, right breast benign Abdominal:     General: Abdomen is flat. Bowel sounds are normal. There is no distension.     Palpations: Abdomen is soft.     Tenderness: There is no abdominal tenderness.  Musculoskeletal:        General: No swelling.     Cervical back: Neck supple.  Lymphadenopathy:     Cervical: No cervical adenopathy.  Skin:    General: Skin is warm and dry.     Findings: No rash.  Neurological:     General: No focal deficit present.     Mental Status: She is alert.  Psychiatric:        Mood and Affect: Mood normal.        Behavior: Behavior normal.  LABORATORY DATA:  CBC    Component Value Date/Time   WBC 6.2 06/02/2022 1323   WBC 7.6 06/03/2021 1435   RBC 4.44 06/02/2022 1323   HGB 13.5 06/02/2022 1323   HGB 13.9 10/27/2017 0908   HCT 40.5 06/02/2022 1323   HCT 42.0 10/27/2017 0908   PLT 154 06/02/2022 1323   PLT 177 10/27/2017 0908   MCV 91.2 06/02/2022 1323   MCV 89.8 10/27/2017 0908   MCH 30.4  06/02/2022 1323   MCHC 33.3 06/02/2022 1323   RDW 12.7 06/02/2022 1323   RDW 12.9 10/27/2017 0908   LYMPHSABS 1.6 06/02/2022 1323   LYMPHSABS 1.7 10/27/2017 0908   MONOABS 0.6 06/02/2022 1323   MONOABS 0.5 10/27/2017 0908   EOSABS 0.1 06/02/2022 1323   EOSABS 0.3 10/27/2017 0908   BASOSABS 0.0 06/02/2022 1323   BASOSABS 0.0 10/27/2017 0908    CMP     Component Value Date/Time   NA 139 06/02/2022 1323   NA 140 10/27/2017 0908   K 4.6 06/02/2022 1323   K 4.8 10/27/2017 0908   CL 106 06/02/2022 1323   CO2 26 06/02/2022 1323   CO2 27 10/27/2017 0908   GLUCOSE 94 06/02/2022 1323   GLUCOSE 96 10/27/2017 0908   BUN 25 (H) 06/02/2022 1323   BUN 20.5 10/27/2017 0908   CREATININE 0.94 06/02/2022 1323   CREATININE 0.9 10/27/2017 0908   CALCIUM 9.7 06/02/2022 1323   CALCIUM 9.7 10/27/2017 0908   PROT 7.1 06/02/2022 1323   PROT 7.2 10/27/2017 0908   ALBUMIN 4.5 06/02/2022 1323   ALBUMIN 4.2 10/27/2017 0908   AST 17 06/02/2022 1323   AST 19 10/27/2017 0908   ALT 7 06/02/2022 1323   ALT 8 10/27/2017 0908   ALKPHOS 46 06/02/2022 1323   ALKPHOS 58 10/27/2017 0908   BILITOT 0.5 06/02/2022 1323   BILITOT 0.62 10/27/2017 0908   GFRNONAA 57 (L) 06/02/2022 1323   GFRAA >60 07/15/2020 0847    ASSESSMENT and THERAPY PLAN:   Malignant neoplasm of overlapping sites of left breast in female, estrogen receptor positive ([place]) Jessica Glenn is a 88 year old woman with estrogen positive metastatic breast cancer on treatment with Zometa and Anastrozole.  She has no clinical or radiographic sign of breast cancer progression.  She is tolerating her treatment well and will continue this.  She will continue annual imaging to evaluate her metastatic lesion's response to treatment in 12/2022.    We will see Jessica Glenn back in 6 months for labs, f/u, and her next Zometa.     All questions were answered. The patient knows to call the clinic with any problems, questions or concerns. We can certainly see the patient  much sooner if necessary.  Total encounter time:20 minutes*in face-to-face visit time, chart review, lab review, care coordination, order entry, and documentation of the encounter time.    Wilber Bihari, NP 06/02/22 2:36 PM Medical Oncology and Hematology [facility] [place], [place] [address] Tel. [phone]    Fax. [phone]  *Total Encounter Time as defined by the Centers for Medicare and Medicaid Services includes, in addition to the face-to-face time of a patient visit (documented in the note above) non-face-to-face time: obtaining and reviewing outside history, ordering and reviewing medications, tests or procedures, care coordination (communications with other health care professionals or caregivers) and documentation in the medical record.

## 2022-06-02 NOTE — Patient Instructions (Signed)

## 2022-06-03 ENCOUNTER — Telehealth: Payer: Self-pay | Admitting: Adult Health

## 2022-06-03 ENCOUNTER — Ambulatory Visit: Payer: Medicare Other

## 2022-06-03 NOTE — Telephone Encounter (Signed)
Scheduled appointment per 8/3 los. Patient is aware.

## 2022-06-16 ENCOUNTER — Encounter: Payer: Medicare Other | Admitting: Dermatology

## 2022-06-16 ENCOUNTER — Ambulatory Visit
Admission: RE | Admit: 2022-06-16 | Discharge: 2022-06-16 | Disposition: A | Discharge status: Home / Self Care | Payer: Medicare Other | Source: Ambulatory Visit | Attending: Family Medicine | Admitting: Family Medicine

## 2022-06-16 DIAGNOSIS — Z1231 Encounter for screening mammogram for malignant neoplasm of breast: Secondary | ICD-10-CM | POA: Diagnosis not present

## 2022-06-22 ENCOUNTER — Encounter: Payer: Medicare Other | Admitting: Dermatology

## 2022-06-27 ENCOUNTER — Ambulatory Visit (INDEPENDENT_AMBULATORY_CARE_PROVIDER_SITE_OTHER): Payer: Medicare Other | Admitting: Dermatology

## 2022-06-27 DIAGNOSIS — L821 Other seborrheic keratosis: Secondary | ICD-10-CM | POA: Diagnosis not present

## 2022-06-27 DIAGNOSIS — D1800 Hemangioma unspecified site: Secondary | ICD-10-CM

## 2022-06-27 DIAGNOSIS — L814 Other melanin hyperpigmentation: Secondary | ICD-10-CM

## 2022-06-27 DIAGNOSIS — L578 Other skin changes due to chronic exposure to nonionizing radiation: Secondary | ICD-10-CM | POA: Diagnosis not present

## 2022-06-27 DIAGNOSIS — D229 Melanocytic nevi, unspecified: Secondary | ICD-10-CM | POA: Diagnosis not present

## 2022-06-27 DIAGNOSIS — Z85828 Personal history of other malignant neoplasm of skin: Secondary | ICD-10-CM | POA: Diagnosis not present

## 2022-06-27 DIAGNOSIS — L820 Inflamed seborrheic keratosis: Secondary | ICD-10-CM

## 2022-06-27 DIAGNOSIS — Z1283 Encounter for screening for malignant neoplasm of skin: Secondary | ICD-10-CM | POA: Diagnosis not present

## 2022-06-27 DIAGNOSIS — Z853 Personal history of malignant neoplasm of breast: Secondary | ICD-10-CM | POA: Diagnosis not present

## 2022-06-27 NOTE — Patient Instructions (Signed)
Due to recent changes in healthcare laws, you may see results of your pathology and/or laboratory studies on MyChart before the doctors have had a chance to review them. We understand that in some cases there may be results that are confusing or concerning to you. Please understand that not all results are received at the same time and often the doctors may need to interpret multiple results in order to provide you with the best plan of care or course of treatment. Therefore, we ask that you please give us 2 business days to thoroughly review all your results before contacting the office for clarification. Should we see a critical lab result, you will be contacted sooner.   If You Need Anything After Your Visit  If you have any questions or concerns for your doctor, please call our main line at [phone] and press option 4 to reach your doctor's medical assistant. If no one answers, please leave a voicemail as directed and we will return your call as soon as possible. Messages left after 4 pm will be answered the following business day.   You may also send us a message via MyChart. We typically respond to MyChart messages within 1-2 business days.  For prescription refills, please ask your pharmacy to contact our office. Our fax number is [phone].  If you have an urgent issue when the clinic is closed that cannot wait until the next business day, you can page your doctor at the number below.    Please note that while we do our best to be available for urgent issues outside of office hours, we are not available 24/7.   If you have an urgent issue and are unable to reach us, you may choose to seek medical care at your doctor's office, retail clinic, urgent care center, or emergency room.  If you have a medical emergency, please immediately call 911 or go to the emergency department.  Pager Numbers  - Dr. Kowalski: [phone]  - Dr. Moye: [phone]  - Dr. Stewart:  [phone]  In the event of inclement weather, please call our main line at [phone] for an update on the status of any delays or closures.  Dermatology Medication Tips: Please keep the boxes that topical medications come in in order to help keep track of the instructions about where and how to use these. Pharmacies typically print the medication instructions only on the boxes and not directly on the medication tubes.   If your medication is too expensive, please contact our office at [phone] option 4 or send us a message through MyChart.   We are unable to tell what your co-pay for medications will be in advance as this is different depending on your insurance coverage. However, we may be able to find a substitute medication at lower cost or fill out paperwork to get insurance to cover a needed medication.   If a prior authorization is required to get your medication covered by your insurance company, please allow us 1-2 business days to complete this process.  Drug prices often vary depending on where the prescription is filled and some pharmacies may offer cheaper prices.  The website www.goodrx.com contains coupons for medications through different pharmacies. The prices here do not account for what the cost may be with help from insurance (it may be cheaper with your insurance), but the website can give you the price if you did not use any insurance.  - You can print the associated coupon and take it with   your prescription to the pharmacy.  - You may also stop by our office during regular business hours and pick up a GoodRx coupon card.  - If you need your prescription sent electronically to a different pharmacy, notify our office through [facility] MyChart or by phone at [phone] option 4.     Si Usted Necesita Algo Despus de Su Visita  Tambin puede enviarnos un mensaje a travs de MyChart. Por lo general respondemos a los mensajes de MyChart en el transcurso de 1 a 2  das hbiles.  Para renovar recetas, por favor pida a su farmacia que se ponga en contacto con nuestra oficina. Nuestro nmero de fax es el [phone].  Si tiene un asunto urgente cuando la clnica est cerrada y que no puede esperar hasta el siguiente da hbil, puede llamar/localizar a su doctor(a) al nmero que aparece a continuacin.   Por favor, tenga en cuenta que aunque hacemos todo lo posible para estar disponibles para asuntos urgentes fuera del horario de oficina, no estamos disponibles las 24 horas del da, los 7 das de la semana.   Si tiene un problema urgente y no puede comunicarse con nosotros, puede optar por buscar atencin mdica  en el consultorio de su doctor(a), en una clnica privada, en un centro de atencin urgente o en una sala de emergencias.  Si tiene una emergencia mdica, por favor llame inmediatamente al 911 o vaya a la sala de emergencias.  Nmeros de bper  - Dr. Kowalski: [phone]  - Dra. Moye: [phone]  - Dra. Stewart: [phone]  En caso de inclemencias del tiempo, por favor llame a nuestra lnea principal al [phone] para una actualizacin sobre el estado de cualquier retraso o cierre.  Consejos para la medicacin en dermatologa: Por favor, guarde las cajas en las que vienen los medicamentos de uso tpico para ayudarle a seguir las instrucciones sobre dnde y cmo usarlos. Las farmacias generalmente imprimen las instrucciones del medicamento slo en las cajas y no directamente en los tubos del medicamento.   Si su medicamento es muy caro, por favor, pngase en contacto con nuestra oficina llamando al [phone] y presione la opcin 4 o envenos un mensaje a travs de MyChart.   No podemos decirle cul ser su copago por los medicamentos por adelantado ya que esto es diferente dependiendo de la cobertura de su seguro. Sin embargo, es posible que podamos encontrar un medicamento sustituto a menor costo o llenar un formulario para que el  seguro cubra el medicamento que se considera necesario.   Si se requiere una autorizacin previa para que su compaa de seguros cubra su medicamento, por favor permtanos de 1 a 2 das hbiles para completar este proceso.  Los precios de los medicamentos varan con frecuencia dependiendo del lugar de dnde se surte la receta y alguna farmacias pueden ofrecer precios ms baratos.  El sitio web www.goodrx.com tiene cupones para medicamentos de diferentes farmacias. Los precios aqu no tienen en cuenta lo que podra costar con la ayuda del seguro (puede ser ms barato con su seguro), pero el sitio web puede darle el precio si no utiliz ningn seguro.  - Puede imprimir el cupn correspondiente y llevarlo con su receta a la farmacia.  - Tambin puede pasar por nuestra oficina durante el horario de atencin regular y recoger una tarjeta de cupones de GoodRx.  - Si necesita que su receta se enve electrnicamente a una farmacia diferente, informe a nuestra oficina a travs de MyChart de [place]   o por telfono llamando al [phone] y presione la opcin 4.  

## 2022-06-27 NOTE — Progress Notes (Unsigned)
Follow-Up Visit   Subjective  Jessica Glenn is a 88 y.o. female who presents for the following: Annual Exam (Hx BCC, hx SCC). The patient presents for Total-Body Skin Exam (TBSE) for skin cancer screening and mole check.  The patient has spots, moles and lesions to be evaluated, some may be new or changing and the patient has concerns that these could be cancer.  The following portions of the chart were reviewed this encounter and updated as appropriate:   Tobacco  Allergies  Meds  Problems  Med Hx  Surg Hx  Fam Hx     Review of Systems:  No other skin or systemic complaints except as noted in HPI or Assessment and Plan.  Objective  Well appearing patient in no apparent distress; mood and affect are within normal limits.  A full examination was performed including scalp, head, eyes, ears, nose, lips, neck, chest, axillae, abdomen, back, buttocks, bilateral upper extremities, bilateral lower extremities, hands, feet, fingers, toes, fingernails, and toenails. All findings within normal limits unless otherwise noted below.  Back x 1 Erythematous stuck-on, waxy papule or plaque  Left Breast Clear.    Assessment & Plan  Inflamed seborrheic keratosis Back x 1 Symptomatic, irritating, patient would like treated. Destruction of lesion - Back x 1 Complexity: simple   Destruction method: cryotherapy   Informed consent: discussed and consent obtained   Timeout:  patient name, date of birth, surgical site, and procedure verified Lesion destroyed using liquid nitrogen: Yes   Region frozen until ice ball extended beyond lesion: Yes   Outcome: patient tolerated procedure well with no complications   Post-procedure details: wound care instructions given    History of breast cancer Left Breast No LAD, clear to visual exam.  Lentigines - Scattered tan macules - Due to sun exposure - Benign-appearing, observe - Recommend daily broad spectrum sunscreen SPF 30+ to sun-exposed areas,  reapply every 2 hours as needed. - Call for any changes  Seborrheic Keratoses - Stuck-on, waxy, tan-brown papules and/or plaques  - Benign-appearing - Discussed benign etiology and prognosis. - Observe - Call for any changes  Melanocytic Nevi - Tan-brown and/or pink-flesh-colored symmetric macules and papules - Benign appearing on exam today - Observation - Call clinic for new or changing moles - Recommend daily use of broad spectrum spf 30+ sunscreen to sun-exposed areas.   Hemangiomas - Red papules - Discussed benign nature - Observe - Call for any changes  Actinic Damage - Chronic condition, secondary to cumulative UV/sun exposure - diffuse scaly erythematous macules with underlying dyspigmentation - Recommend daily broad spectrum sunscreen SPF 30+ to sun-exposed areas, reapply every 2 hours as needed.  - Staying in the shade or wearing long sleeves, sun glasses (UVA+UVB protection) and wide brim hats (4-inch brim around the entire circumference of the hat) are also recommended for sun protection.  - Call for new or changing lesions.  History of Basal Cell Carcinoma of the Skin - No evidence of recurrence today - Recommend regular full body skin exams - Recommend daily broad spectrum sunscreen SPF 30+ to sun-exposed areas, reapply every 2 hours as needed.  - Call if any new or changing lesions are noted between office visits  History of Squamous Cell Carcinoma of the Skin - No evidence of recurrence today - No lymphadenopathy - Recommend regular full body skin exams - Recommend daily broad spectrum sunscreen SPF 30+ to sun-exposed areas, reapply every 2 hours as needed.  - Call if any new or  changing lesions are noted between office visits  Skin cancer screening performed today.  Return in about 1 year (around 06/28/2023) for TBSE.  Luther Redo, CMA, am acting as scribe for Sarina Ser, MD . Documentation: I have reviewed the above documentation for accuracy  and completeness, and I agree with the above.  Sarina Ser, MD

## 2022-06-28 ENCOUNTER — Encounter: Payer: Self-pay | Admitting: Dermatology

## 2022-08-08 DIAGNOSIS — I700 Atherosclerosis of aorta: Secondary | ICD-10-CM | POA: Diagnosis not present

## 2022-08-08 DIAGNOSIS — C773 Secondary and unspecified malignant neoplasm of axilla and upper limb lymph nodes: Secondary | ICD-10-CM | POA: Diagnosis not present

## 2022-08-08 DIAGNOSIS — G4700 Insomnia, unspecified: Secondary | ICD-10-CM | POA: Diagnosis not present

## 2022-08-08 DIAGNOSIS — Z0000 Encounter for general adult medical examination without abnormal findings: Secondary | ICD-10-CM | POA: Diagnosis not present

## 2022-08-08 DIAGNOSIS — M8588 Other specified disorders of bone density and structure, other site: Secondary | ICD-10-CM | POA: Diagnosis not present

## 2022-08-08 DIAGNOSIS — B356 Tinea cruris: Secondary | ICD-10-CM | POA: Diagnosis not present

## 2022-08-08 DIAGNOSIS — E7800 Pure hypercholesterolemia, unspecified: Secondary | ICD-10-CM | POA: Diagnosis not present

## 2022-08-08 DIAGNOSIS — R030 Elevated blood-pressure reading, without diagnosis of hypertension: Secondary | ICD-10-CM | POA: Diagnosis not present

## 2022-08-12 DIAGNOSIS — H6123 Impacted cerumen, bilateral: Secondary | ICD-10-CM | POA: Diagnosis not present

## 2022-09-27 ENCOUNTER — Other Ambulatory Visit: Payer: Self-pay | Admitting: Hematology and Oncology

## 2022-09-27 NOTE — Telephone Encounter (Signed)
Per 05/2022 OV, continue anastrazole. Gardiner Rhyme, RN

## 2022-11-18 ENCOUNTER — Other Ambulatory Visit: Payer: Self-pay | Admitting: Radiation Therapy

## 2022-11-18 DIAGNOSIS — C7951 Secondary malignant neoplasm of bone: Secondary | ICD-10-CM

## 2022-11-23 ENCOUNTER — Telehealth: Payer: Self-pay | Admitting: Hematology and Oncology

## 2022-11-23 NOTE — Telephone Encounter (Signed)
Scheduled appointment per referral. Patient is aware of appointment date and time. Patient is aware to arrive 15 mins prior to appointment time and to bring updated insurance cards. Patient is aware of location.   

## 2022-11-30 ENCOUNTER — Telehealth: Payer: Self-pay | Admitting: Radiation Therapy

## 2022-11-30 ENCOUNTER — Telehealth: Payer: Self-pay | Admitting: Hematology and Oncology

## 2022-11-30 NOTE — Telephone Encounter (Signed)
Patient called to r/s appointments next week due to prior appointments at other locations. Patient r/s and notified.

## 2022-11-30 NOTE — Telephone Encounter (Signed)
Spoke with Jessica Glenn about her upcoming thoracic spine MRI and telephone follow-up scheduled in April. She has recorded these appointments and plans to attend.   Mont Dutton R.T.(R)(T) Radiation Special Procedures Navigator

## 2022-12-02 ENCOUNTER — Other Ambulatory Visit: Payer: Self-pay | Admitting: *Deleted

## 2022-12-02 DIAGNOSIS — Z170 Estrogen receptor positive status [ER+]: Secondary | ICD-10-CM

## 2022-12-05 ENCOUNTER — Encounter: Payer: Self-pay | Admitting: Hematology and Oncology

## 2022-12-05 ENCOUNTER — Other Ambulatory Visit: Payer: Self-pay

## 2022-12-05 ENCOUNTER — Inpatient Hospital Stay: Payer: Medicare Other

## 2022-12-05 ENCOUNTER — Inpatient Hospital Stay: Payer: Medicare Other | Admitting: Hematology and Oncology

## 2022-12-05 ENCOUNTER — Inpatient Hospital Stay: Payer: Medicare Other | Attending: Hematology and Oncology

## 2022-12-05 VITALS — BP 104/81 | HR 80 | Temp 97.8°F | Resp 16 | Ht 65.0 in | Wt 156.2 lb

## 2022-12-05 DIAGNOSIS — C773 Secondary and unspecified malignant neoplasm of axilla and upper limb lymph nodes: Secondary | ICD-10-CM | POA: Diagnosis not present

## 2022-12-05 DIAGNOSIS — Z170 Estrogen receptor positive status [ER+]: Secondary | ICD-10-CM | POA: Diagnosis not present

## 2022-12-05 DIAGNOSIS — C50812 Malignant neoplasm of overlapping sites of left female breast: Secondary | ICD-10-CM

## 2022-12-05 DIAGNOSIS — C50412 Malignant neoplasm of upper-outer quadrant of left female breast: Secondary | ICD-10-CM | POA: Diagnosis not present

## 2022-12-05 DIAGNOSIS — M8580 Other specified disorders of bone density and structure, unspecified site: Secondary | ICD-10-CM | POA: Diagnosis not present

## 2022-12-05 DIAGNOSIS — Z79811 Long term (current) use of aromatase inhibitors: Secondary | ICD-10-CM | POA: Diagnosis not present

## 2022-12-05 DIAGNOSIS — C50912 Malignant neoplasm of unspecified site of left female breast: Secondary | ICD-10-CM

## 2022-12-05 LAB — CMP (CANCER CENTER ONLY)
ALT: 7 U/L (ref 0–44)
AST: 16 U/L (ref 15–41)
Albumin: 4.4 g/dL (ref 3.5–5.0)
Alkaline Phosphatase: 46 U/L (ref 38–126)
Anion gap: 8 (ref 5–15)
BUN: 27 mg/dL — ABNORMAL HIGH (ref 8–23)
CO2: 28 mmol/L (ref 22–32)
Calcium: 10.1 mg/dL (ref 8.9–10.3)
Chloride: 105 mmol/L (ref 98–111)
Creatinine: 0.99 mg/dL (ref 0.44–1.00)
GFR, Estimated: 54 mL/min — ABNORMAL LOW (ref >60)
Glucose, Bld: 134 mg/dL — ABNORMAL HIGH (ref 70–99)
Potassium: 5.2 mmol/L — ABNORMAL HIGH (ref 3.5–5.1)
Sodium: 141 mmol/L (ref 135–145)
Total Bilirubin: 0.5 mg/dL (ref 0.3–1.2)
Total Protein: 6.9 g/dL (ref 6.5–8.1)

## 2022-12-05 LAB — CBC WITH DIFFERENTIAL (CANCER CENTER ONLY)
Abs Immature Granulocytes: 0.01 10*3/uL (ref 0.00–0.07)
Basophils Absolute: 0 10*3/uL (ref 0.0–0.1)
Basophils Relative: 0 %
Eosinophils Absolute: 0.1 10*3/uL (ref 0.0–0.5)
Eosinophils Relative: 2 %
HCT: 44.5 % (ref 36.0–46.0)
Hemoglobin: 14.5 g/dL (ref 12.0–15.0)
Immature Granulocytes: 0 %
Lymphocytes Relative: 22 %
Lymphs Abs: 1.5 10*3/uL (ref 0.7–4.0)
MCH: 29.8 pg (ref 26.0–34.0)
MCHC: 32.6 g/dL (ref 30.0–36.0)
MCV: 91.4 fL (ref 80.0–100.0)
Monocytes Absolute: 0.5 10*3/uL (ref 0.1–1.0)
Monocytes Relative: 7 %
Neutro Abs: 4.6 10*3/uL (ref 1.7–7.7)
Neutrophils Relative %: 69 %
Platelet Count: 159 10*3/uL (ref 150–400)
RBC: 4.87 MIL/uL (ref 3.87–5.11)
RDW: 12.4 % (ref 11.5–15.5)
WBC Count: 6.7 10*3/uL (ref 4.0–10.5)
nRBC: 0 % (ref 0.0–0.2)

## 2022-12-05 MED ORDER — SODIUM CHLORIDE 0.9 % IV SOLN: INTRAVENOUS | Status: DC

## 2022-12-05 MED ORDER — ZOLEDRONIC ACID 4 MG/5ML IV CONC
4.0000 mg | Freq: Once | INTRAVENOUS | Status: DC
  Filled 2022-12-05: qty 5

## 2022-12-05 MED ORDER — ZOLEDRONIC ACID 4 MG/100ML IV SOLN
4.0000 mg | Freq: Once | INTRAVENOUS | Status: AC
  Administered 2022-12-05: 4 mg via INTRAVENOUS
  Filled 2022-12-05: qty 100

## 2022-12-05 NOTE — Progress Notes (Signed)
Jessica Glenn  Telephone:([phone]) [phone] Fax:(336) [phone]     ID: Jessica Glenn DOB: 1929-01-17  MR#: [id number]  MIW#:[id number]  Patient Care Team: Jessica Dus, MD (Inactive) as PCP - General (Family Medicine) Jessica Kussmaul, MD as Consulting Physician (General Surgery) Jessica Rudd, MD as Consulting Physician (Radiation Oncology) OTHER MD:  CHIEF COMPLAINT: Locally advanced/metastatic estrogen receptor positive breast cancer (s/p left mastectomy)  CURRENT TREATMENT: Anastrozole; zoledronate  INTERVAL HISTORY:  Jessica Glenn returns today for follow-up of her metastatic breast cancer.  She currently continues on anastrozole and would like to follow-up once a year.  She denies any changes in her health.  No new bone pains or other complaints.  She denies any adverse effects with anastrozole.  She continues to have unilateral mammogram of the right breast once a year.  She also has MR spine imaging scheduled for February 01, 2022.  Rest of the pertinent 10 point ROS reviewed and negative  COVID 19 VACCINATION STATUS:   Refuses vaccination  HISTORY OF CURRENT ILLNESS: From the original intake note:  Jessica Glenn tells me she first noted a changes in her left breast about a year and a half ago. She had had some dental work around that time and thought possibly that could be related. She said her breast felt "like steel". She did not bring it to medical attention until her recent appointment with Dr. Alyson Glenn and he set her up for bilateral diagnostic mammography with tomography and left breast ultrasonography at the [facility] 06/19/2017. This found the breast density to be category C. In the upper outer retroareolar left breast there was an irregular mass estimated to be at least 6 cm by mammography. There were heterogeneous calcifications within the mass. There was skin thickening of the areola and an enlarged inferior left axillary lymph node was noted. On exam there was a large fixed hard mass in  the upper outer quadrant of the left breast causing protrusion of the areola. It measured approximately 7 cm by palpation and there was visible skin thickening. In the inferior left axilla there was a firm palpable mass measuring 2 cm.  Ultrasound confirmed an irregular hypoechoic vascular mass in the left breast centered on the 1:00 position 4 cm from the nipple measuring 6.7 cm. This was abutting the overlying skin. It was a 2.3 cm hypoechoic vascular mass in the left axilla. The right breast was unremarkable.  On 06/22/2017 the patient had left breast and left axillary node biopsy, showing ([id number]) both sites to be involved by invasive ductal carcinoma, grade 2, with prognostic panels on both biopsies showing estrogen receptor 100% positivity, progesterone receptor 40-50% positivity, all with strong staining intensity, MIB-1:30 percent in the breast and 15% in the lymph nodes, and both HER-2 negative, with ratios of 0.76-0.85 and number per cell 1.10-1.30.  The patient's subsequent history is as detailed below.   PAST MEDICAL HISTORY: Past Medical History:  Diagnosis Date   Basal cell carcinoma    s/p Moh's surgery  to left side of nose   Breast cancer, left breast ([facility]) dx'd 05/2017   History of tobacco use    Squamous cell carcinoma of skin 11/18/2019   Left pretibial. KA-type. EDC   Varicose vein of leg    BLE    PAST SURGICAL HISTORY: Past Surgical History:  Procedure Laterality Date   BREAST BIOPSY Left 05/2017   CATARACT EXTRACTION W/ INTRAOCULAR LENS  IMPLANT, BILATERAL Bilateral    DILATION AND CURETTAGE OF UTERUS  FRACTURE SURGERY     HARDWARE REMOVAL Right 02/14/2017   Procedure: RIGHT ANKLE HARDWARE REMOVAL;  Surgeon: [name], MD;  Location: [facility];  Service: Orthopedics;  Laterality: Right;   I & D EXTREMITY Right 02/14/2017   Procedure: RIGHT IRRIGATION AND DEBRIDEMENT ANKLE;  Surgeon: [name], MD;  Location: [facility];  Service: Orthopedics;  Laterality: Right;   MASTECTOMY Left 02/14/2018   LEFT MASTECTOMY WITH DEEP LEFT AXILLARY SENTINEL LYMPH NODE BIOPSY    MASTECTOMY W/ SENTINEL NODE BIOPSY Left 02/14/2018   Procedure: LEFT MASTECTOMY WITH SENTINEL LYMPH NODE BIOPSY AND POSSIBLE NODE DISSECTION;  Surgeon: Jessica Kussmaul, MD;  Location: [facility];  Service: General;  Laterality: Left;   MOHS SURGERY Left    "side of nose"   ORIF ANKLE FRACTURE Right 02/02/2016   Procedure: OPEN REDUCTION INTERNAL FIXATION (ORIF) ANKLE FRACTURE;  Surgeon: [name], MD;  Location: [facility];  Service: Orthopedics;  Laterality: Right;  strykermini c armreg bedbone foam    FAMILY HISTORY Family History  Problem Relation Age of Onset   COPD Mother    Emphysema Mother   The patient's father had a history of alcoholism and a band in the family. The patient has no information on the paternal side. The patient's mother died from emphysema at the age of 24. The patient has one sister, 2 brothers. There is no history of breast or ovarian cancer in the family to her knowledge.   GYNECOLOGIC HISTORY:  No LMP recorded. Patient is postmenopausal. Menarche age 13, first live birth age 78, the patient is [place] P4. She went through the change of life in her 68s. She took hormone replacement for a few years.    SOCIAL HISTORY:  The patient is originally from [place]. Her husband Jessica Glenn is a traveling Theme park manager, nondenominational. Son Jessica Glenn lives in [place] where he works as a Theme park manager; son Jessica Glenn lives in [place] where he works as an Optometrist; son Jessica Glenn lives in [place] where he works as a Chief Strategy Officer; son Jessica Glenn also lives in [place] and works as a Chief Strategy Officer. The patient has 14 grandchildren and 30 great-grandchildren    ADVANCED DIRECTIVES: In place   HEALTH MAINTENANCE: Social History   Tobacco Use   Smoking status: Former    Packs/day: 1.00    Years: 8.00    Total pack years: 8.00    Types:  Cigarettes    Quit date: 1960    Years since quitting: 64.1   Smokeless tobacco: Never  Vaping Use   Vaping Use: Never used  Substance Use Topics   Alcohol use: Yes    Alcohol/week: 5.0 standard drinks of alcohol    Types: 5 Glasses of wine per week   Drug use: No     Colonoscopy: Never  PAP:  Bone density: Osteopenia   No Known Allergies  Current Outpatient Medications  Medication Sig Dispense Refill   anastrozole (ARIMIDEX) 1 MG tablet TAKE 1 TABLET BY MOUTH DAILY 90 tablet 4   ascorbic acid (VITAMIN C) 1000 MG tablet Take 1,000 mg by mouth daily. (Patient not taking: Reported on 06/02/2022)     Calcium Carb-Cholecalciferol (CALCIUM 600 + D PO) Take 1 tablet by mouth daily. (Patient not taking: Reported on 06/02/2022)     Cholecalciferol (VITAMIN D3) 3000 units TABS Take 3,000 Units by mouth daily. (Patient not taking: Reported on 06/02/2022)     Cyanocobalamin (B-12) 5000 MCG CAPS Take 5,000 mcg by mouth  daily.     diphenhydramine-acetaminophen (TYLENOL PM EXTRA STRENGTH) 25-500 MG TABS tablet 1-2 tablets     magnesium oxide (MAG-OX) 400 (241.3 Mg) MG tablet Take 1 tablet (400 mg total) by mouth daily. (Patient not taking: Reported on 06/02/2022)     melatonin 3 MG TABS tablet Take 3 mg by mouth at bedtime. (Patient not taking: Reported on 06/02/2022)     Multiple Vitamin (MULTIVITAMIN WITH MINERALS) TABS tablet Take 1 tablet by mouth daily. (Patient not taking: Reported on 06/02/2022)     thiamine (VITAMIN B-1) 50 MG tablet Take 50 mg by mouth daily. (Patient not taking: Reported on 06/02/2022)     Zoledronic Acid (ZOMETA IV) Inject into the vein.     No current facility-administered medications for this visit.    OBJECTIVE: White woman who appears well  Vitals:   12/05/22 1338  BP: 104/81  Pulse: 80  Resp: 16  Temp: 97.8 F (36.6 C)  SpO2: 98%    Wt Readings from Last 3 Encounters:  12/05/22 156 lb 3.2 oz (70.9 kg)  06/02/22 157 lb (71.2 kg)  12/07/21 159 lb 14.4 oz (72.5  kg)   Body mass index is 25.99 kg/m.    ECOG FS:1 - Symptomatic but completely ambulatory GENERAL: Patient is a well appearing female in no acute distress HEENT:  Sclerae anicteric.  Oropharynx clear and moist. No ulcerations or evidence of oropharyngeal candidiasis. Neck is supple.  NODES:  No cervical, supraclavicular, or axillary lymphadenopathy palpated.  BREAST EXAM:  left breast s/p mastectomy, no sign of local recurrence right breast benign.  No change LUNGS:  Clear to auscultation bilaterally.  No wheezes or rhonchi. HEART:  Regular rate and rhythm. No murmur appreciated. ABDOMEN:  Soft, nontender.  Positive, normoactive bowel sounds. No organomegaly palpated. MSK:  No focal spinal tenderness to palpation. Full range of motion bilaterally in the upper extremities. EXTREMITIES:  No peripheral edema.   SKIN:  Clear with no obvious rashes or skin changes. No nail dyscrasia. NEURO:  Nonfocal. Well oriented.  Appropriate affect.  No change in exam from last time. LAB RESULTS  CMP     Component Value Date/Time   NA 139 06/02/2022 1323   NA 140 10/27/2017 0908   K 4.6 06/02/2022 1323   K 4.8 10/27/2017 0908   CL 106 06/02/2022 1323   CO2 26 06/02/2022 1323   CO2 27 10/27/2017 0908   GLUCOSE 94 06/02/2022 1323   GLUCOSE 96 10/27/2017 0908   BUN 25 (H) 06/02/2022 1323   BUN 20.5 10/27/2017 0908   CREATININE 0.94 06/02/2022 1323   CREATININE 0.9 10/27/2017 0908   CALCIUM 9.7 06/02/2022 1323   CALCIUM 9.7 10/27/2017 0908   PROT 7.1 06/02/2022 1323   PROT 7.2 10/27/2017 0908   ALBUMIN 4.5 06/02/2022 1323   ALBUMIN 4.2 10/27/2017 0908   AST 17 06/02/2022 1323   AST 19 10/27/2017 0908   ALT 7 06/02/2022 1323   ALT 8 10/27/2017 0908   ALKPHOS 46 06/02/2022 1323   ALKPHOS 58 10/27/2017 0908   BILITOT 0.5 06/02/2022 1323   BILITOT 0.62 10/27/2017 0908   GFRNONAA 57 (L) 06/02/2022 1323   GFRAA >60 07/15/2020 0847    No results found for: "TOTALPROTELP", "ALBUMINELP",  "A1GS", "A2GS", "BETS", "BETA2SER", "GAMS", "MSPIKE", "SPEI"  No results found for: "KPAFRELGTCHN", "LAMBDASER", "KAPLAMBRATIO"  Lab Results  Component Value Date   WBC 6.7 12/05/2022   NEUTROABS 4.6 12/05/2022   HGB 14.5 12/05/2022   HCT 44.5 12/05/2022  MCV 91.4 12/05/2022   PLT 159 12/05/2022   Lab Results  Component Value Date   CA2729 61.6 (H) 07/05/2018    Urinalysis No results found for: "COLORURINE", "APPEARANCEUR", "LABSPEC", "PHURINE", "GLUCOSEU", "HGBUR", "BILIRUBINUR", "KETONESUR", "PROTEINUR", "UROBILINOGEN", "NITRITE", "LEUKOCYTESUR"   STUDIES: No results found.   ELIGIBLE FOR AVAILABLE RESEARCH PROTOCOL: no  ASSESSMENT: 89 y.o. [place] woman status post left breast overlapping sites biopsy 06/22/2017 for a clinical T3 N1-2, stage II-III invasive ductal carcinoma, grade 2, estrogen and progesterone receptor positive, HER-2 nonamplified, with an MIB-1 of 30%  (a) staging CT of the chest and bone survey and bone scan July 11, 2017 showed multiple indeterminant subcentimeter pulmonary nodules which will require follow-up, as well as an indeterminate right hepatic lobe lesion, but no definite evidence of malignancy.  (b) CA-27-29 is informative  (1) neo-adjuvant fulvestrant started on 07/05/2017, discontinued 12/20/2017 with possible progression  (2) status post left mastectomy and sentinel lymph node sampling 02/14/2018 for a T3 N1, stage IIA invasive ductal carcinoma, grade 1, with negative margins.  Repeat prognostic panel again estrogen and progesterone receptor positive, HER-2 negative  (a) 2 of 7 removed lymph nodes were positive  (3) adjuvant radiation to follow surgery  (4) anastrozole started 01/17/2018  (a) start palbociclib once disease progression is documented  (5) METASTATIC DISEASE: April 2019  (a) CT scan of the chest 02/05/2018 shows a new lesion at T2  (b) total spinal MRI 03/14/2018 confirms lesion at T2 and possible minimal  lesion at T8, neither amenable to biopsy; there are no other suspicious lesions  (c) total spinal MRI 07/02/2020 showed the treated lesion at T2, stable abnormal enhancement at T8 and perhaps slightly increased at T9.  There was resolution of enhancement at T10, otherwise some evidence of degenerative disease.  Cervical and lumbar spines were negative  (d) chest CT on 902 2021 shows stable small pulmonary nodules, mild bronchiectasis, and aortic atherosclerosis but no measurable disease.  (6)  adjuvant radiation 05/01/2018 -06/15/2018  (a) received capecitabine sensitization given the extent of local disease  (b) Site/dose:   The patient initially received a dose of 50.4 Gy in 28 fractions to the left chest wall and supraclavicular region. This was delivered using a 3-D conformal, 4 field technique. The patient then received a boost to the mastectomy scar. This delivered an additional 10 Gy in 5 fractions using an en face electron field. The total dose was 60.4 Gy  (c) stereotactic radiosurgery to T2 given 07/05/2018  (7) started zolendronate 07/11/2018, to be repeated every 12 weeks  (a) bone density 10/10/2019 shows a T score of -2.0  (b) bone density December 2022  (c) zolendronate changed to every 6 months after the 09/30/2020 dose   PLAN:  Stephenia is here today for f/u of her metastatic breast cancer.   She has no clinical sign of progression of her cancer.   She continues on Anastrozole with good tolerance and will continue this.   She is also receiving Zometa every 6 months and will proceed with this today.  No new dental concerns reported today. She has follow-up MRI thoracic spine April 2024. We will try to continue anastrozole indefinitely at this point.  During her last visit we have discussed about considering follow-up every 6 months but she is reluctant.  She would like to come every year.  She was however encouraged to contact us with any new or worsening bone pains or other health  concerns and she expressed understanding.  She will  follow-up in 6 months for labs and infusion with Zometa.  Otherwise she will return to clinic in 1 year.  Total encounter time 30 minutes.* in face to face visit time, lab review, chart review, order entry and documentation of the encounter.   *Total Encounter Time as defined by the Centers for Medicare and Medicaid Services includes, in addition to the face-to-face time of a patient visit (documented in the note above) non-face-to-face time: obtaining and reviewing outside history, ordering and reviewing medications, tests or procedures, care coordination (communications with other health care professionals or caregivers) and documentation in the medical record.

## 2022-12-05 NOTE — Patient Instructions (Signed)

## 2022-12-07 ENCOUNTER — Other Ambulatory Visit: Payer: Medicare Other

## 2022-12-07 ENCOUNTER — Ambulatory Visit: Payer: Medicare Other

## 2022-12-07 ENCOUNTER — Ambulatory Visit: Payer: Medicare Other | Admitting: Hematology and Oncology

## 2022-12-09 ENCOUNTER — Other Ambulatory Visit: Payer: Medicare Other

## 2022-12-09 ENCOUNTER — Ambulatory Visit: Payer: Medicare Other

## 2022-12-09 ENCOUNTER — Ambulatory Visit: Payer: Medicare Other | Admitting: Hematology and Oncology

## 2022-12-26 ENCOUNTER — Encounter: Payer: Self-pay | Admitting: Dermatology

## 2022-12-26 ENCOUNTER — Ambulatory Visit (INDEPENDENT_AMBULATORY_CARE_PROVIDER_SITE_OTHER): Payer: Medicare Other | Admitting: Dermatology

## 2022-12-26 VITALS — BP 143/85 | HR 73

## 2022-12-26 DIAGNOSIS — D229 Melanocytic nevi, unspecified: Secondary | ICD-10-CM

## 2022-12-26 DIAGNOSIS — B078 Other viral warts: Secondary | ICD-10-CM | POA: Diagnosis not present

## 2022-12-26 DIAGNOSIS — D489 Neoplasm of uncertain behavior, unspecified: Secondary | ICD-10-CM

## 2022-12-26 DIAGNOSIS — L821 Other seborrheic keratosis: Secondary | ICD-10-CM | POA: Diagnosis not present

## 2022-12-26 DIAGNOSIS — L578 Other skin changes due to chronic exposure to nonionizing radiation: Secondary | ICD-10-CM

## 2022-12-26 NOTE — Patient Instructions (Addendum)
Electrodesiccation and Curettage ("Scrape and Burn") Wound Care Instructions  Leave the original bandage on for 24 hours if possible.  If the bandage becomes soaked or soiled before that time, it is OK to remove it and examine the wound.  A small amount of post-operative bleeding is normal.  If excessive bleeding occurs, remove the bandage, place gauze over the site and apply continuous pressure (no peeking) over the area for 30 minutes. If this does not work, please call our clinic as soon as possible or page your doctor if it is after hours.   Once a day, cleanse the wound with soap and water. It is fine to shower. If a thick crust develops you may use a Q-tip dipped into dilute hydrogen peroxide (mix 1:1 with water) to dissolve it.  Hydrogen peroxide can slow the healing process, so use it only as needed.    After washing, apply petroleum jelly (Vaseline) or an antibiotic ointment if your doctor prescribed one for you, followed by a bandage.    For best healing, the wound should be covered with a layer of ointment at all times. If you are not able to keep the area covered with a bandage to hold the ointment in place, this may mean re-applying the ointment several times a day.  Continue this wound care until the wound has healed and is no longer open. It may take several weeks for the wound to heal and close.  Itching and mild discomfort is normal during the healing process.  If you have any discomfort, you can take Tylenol (acetaminophen) or ibuprofen as directed on the bottle. (Please do not take these if you have an allergy to them or cannot take them for another reason).  Some redness, tenderness and white or yellow material in the wound is normal healing.  If the area becomes very sore and red, or develops a thick yellow-green material (pus), it may be infected; please notify us.    Wound healing continues for up to one year following surgery. It is not unusual to experience pain in the scar  from time to time during the interval.  If the pain becomes severe or the scar thickens, you should notify the office.    A slight amount of redness in a scar is expected for the first six months.  After six months, the redness will fade and the scar will soften and fade.  The color difference becomes less noticeable with time.  If there are any problems, return for a post-op surgery check at your earliest convenience.  To improve the appearance of the scar, you can use silicone scar gel, cream, or sheets (such as Mederma or Serica) every night for up to one year. These are available over the counter (without a prescription).  Please call our office at [phone] for any questions or concerns.     Biopsy Wound Care Instructions  Leave the original bandage on for 24 hours if possible.  If the bandage becomes soaked or soiled before that time, it is OK to remove it and examine the wound.  A small amount of post-operative bleeding is normal.  If excessive bleeding occurs, remove the bandage, place gauze over the site and apply continuous pressure (no peeking) over the area for 30 minutes. If this does not work, please call our clinic as soon as possible or page your doctor if it is after hours.   Once a day, cleanse the wound with soap and water. It is fine  to shower. If a thick crust develops you may use a Q-tip dipped into dilute hydrogen peroxide (mix 1:1 with water) to dissolve it.  Hydrogen peroxide can slow the healing process, so use it only as needed.    After washing, apply petroleum jelly (Vaseline) or an antibiotic ointment if your doctor prescribed one for you, followed by a bandage.    For best healing, the wound should be covered with a layer of ointment at all times. If you are not able to keep the area covered with a bandage to hold the ointment in place, this may mean re-applying the ointment several times a day.  Continue this wound care until the wound has healed and is no  longer open.   Itching and mild discomfort is normal during the healing process. However, if you develop pain or severe itching, please call our office.   If you have any discomfort, you can take Tylenol (acetaminophen) or ibuprofen as directed on the bottle. (Please do not take these if you have an allergy to them or cannot take them for another reason).  Some redness, tenderness and white or yellow material in the wound is normal healing.  If the area becomes very sore and red, or develops a thick yellow-green material (pus), it may be infected; please notify us.    If you have stitches, return to clinic as directed to have the stitches removed. You will continue wound care for 2-3 days after the stitches are removed.   Wound healing continues for up to one year following surgery. It is not unusual to experience pain in the scar from time to time during the interval.  If the pain becomes severe or the scar thickens, you should notify the office.    A slight amount of redness in a scar is expected for the first six months.  After six months, the redness will fade and the scar will soften and fade.  The color difference becomes less noticeable with time.  If there are any problems, return for a post-op surgery check at your earliest convenience.  To improve the appearance of the scar, you can use silicone scar gel, cream, or sheets (such as Mederma or Serica) every night for up to one year. These are available over the counter (without a prescription).  Please call our office at [phone] for any questions or concerns.    Due to recent changes in healthcare laws, you may see results of your pathology and/or laboratory studies on MyChart before the doctors have had a chance to review them. We understand that in some cases there may be results that are confusing or concerning to you. Please understand that not all results are received at the same time and often the doctors may need to interpret  multiple results in order to provide you with the best plan of care or course of treatment. Therefore, we ask that you please give [place] 2 business days to thoroughly review all your results before contacting the office for clarification. Should we see a critical lab result, you will be contacted sooner.   If You Need Anything After Your Visit  If you have any questions or concerns for your doctor, please call our main line at [phone] and press option 4 to reach your doctor's medical assistant. If no one answers, please leave a voicemail as directed and we will return your call as soon as possible. Messages left after 4 pm will be answered the following business day.  You may also send [place] a message via [place]. We typically respond to MyChart messages within 1-2 business days.  For prescription refills, please ask your pharmacy to contact our office. Our fax number is [phone].  If you have an urgent issue when the clinic is closed that cannot wait until the next business day, you can page your doctor at the number below.    Please note that while we do our best to be available for urgent issues outside of office hours, we are not available 24/7.   If you have an urgent issue and are unable to reach [place], you may choose to seek medical care at your doctor's office, retail clinic, urgent care center, or emergency room.  If you have a medical emergency, please immediately call 911 or go to the emergency department.  Pager Numbers  - Dr. Nehemiah Massed: [phone]  - Dr. Laurence Ferrari: [phone]  - Dr. Nicole Kindred: [phone]  In the event of inclement weather, please call our main line at [phone] for an update on the status of any delays or closures.  Dermatology Medication Tips: Please keep the boxes that topical medications come in in order to help keep track of the instructions about where and how to use these. Pharmacies typically print the medication instructions only on the boxes and  not directly on the medication tubes.   If your medication is too expensive, please contact our office at [phone] option 4 or send [place] a message through [place].   We are unable to tell what your co-pay for medications will be in advance as this is different depending on your insurance coverage. However, we may be able to find a substitute medication at lower cost or fill out paperwork to get insurance to cover a needed medication.   If a prior authorization is required to get your medication covered by your insurance company, please allow [place] 1-2 business days to complete this process.  Drug prices often vary depending on where the prescription is filled and some pharmacies may offer cheaper prices.  The website www.goodrx.com contains coupons for medications through different pharmacies. The prices here do not account for what the cost may be with help from insurance (it may be cheaper with your insurance), but the website can give you the price if you did not use any insurance.  - You can print the associated coupon and take it with your prescription to the pharmacy.  - You may also stop by our office during regular business hours and pick up a GoodRx coupon card.  - If you need your prescription sent electronically to a different pharmacy, notify our office through [facility] or by phone at [phone] option 4.     Si Usted Necesita Algo Despus de Su Visita  Tambin puede enviarnos un mensaje a travs de Pharmacist, community. Por lo general respondemos a los mensajes de MyChart en el transcurso de 1 a 2 das hbiles.  Para renovar recetas, por favor pida a su farmacia que se ponga en contacto con nuestra oficina. Harland Dingwall de fax es [place] [phone].  Si tiene un asunto urgente cuando la clnica est cerrada y que no puede esperar hasta el siguiente da hbil, puede llamar/localizar a su doctor(a) al nmero que aparece a continuacin.   Por favor, tenga en cuenta que aunque  hacemos todo lo posible para estar disponibles para asuntos urgentes fuera del horario de [place], no estamos disponibles las 24 horas del da, los 7 das de la [place].  Si tiene un problema urgente y no puede comunicarse con nosotros, puede optar por buscar atencin mdica  en el consultorio de su doctor(a), en una clnica privada, en un centro de atencin urgente o en una sala de emergencias.  Si tiene Engineering geologist, por favor llame inmediatamente al 911 o vaya a la sala de emergencias.  Nmeros de bper  - Dr. Nehemiah Massed: [phone]  - Dra. Moye: [phone]  - Dra. Nicole Kindred: [phone]  En caso de inclemencias del [place], por favor llame a Johnsie Kindred principal al [phone] para una actualizacin sobre el [place] de cualquier retraso o cierre.  Consejos para la medicacin en dermatologa: Por favor, guarde las cajas en las que vienen los medicamentos de uso tpico para ayudarle a seguir las instrucciones sobre dnde y cmo usarlos. Las farmacias generalmente imprimen las instrucciones del medicamento slo en las cajas y no directamente en los tubos del [place].   Si su medicamento es muy caro, por favor, pngase en contacto con Zigmund Daniel llamando al [phone] y presione la opcin 4 o envenos un mensaje a travs de Pharmacist, community.   No podemos decirle cul ser su copago por los medicamentos por adelantado ya que esto es diferente dependiendo de la cobertura de su seguro. Sin embargo, es posible que podamos encontrar un medicamento sustituto a Electrical engineer un formulario para que el seguro cubra el medicamento que se considera necesario.   Si se requiere una autorizacin previa para que su compaa de seguros [place] su medicamento, por favor permtanos de 1 a 2 das hbiles para completar este proceso.  Los precios de los medicamentos varan con frecuencia dependiendo del Environmental consultant de dnde se surte la receta y alguna farmacias pueden ofrecer precios ms  baratos.  El sitio web www.goodrx.com tiene cupones para medicamentos de Airline pilot. Los precios aqu no tienen en cuenta lo que podra costar con la ayuda del seguro (puede ser ms barato con su seguro), pero el sitio web puede darle el precio si no utiliz Research scientist (physical sciences).  - Puede imprimir el cupn correspondiente y llevarlo con su receta a la farmacia.  - Tambin puede pasar por nuestra oficina durante el horario de atencin regular y Charity fundraiser una tarjeta de cupones de GoodRx.  - Si necesita que su receta se enve electrnicamente a una farmacia diferente, informe a nuestra oficina a travs de MyChart de [place] o por telfono llamando al [phone] y presione la opcin 4.      Due to recent changes in healthcare laws, you may see results of your pathology and/or laboratory studies on MyChart before the doctors have had a chance to review them. We understand that in some cases there may be results that are confusing or concerning to you. Please understand that not all results are received at the same time and often the doctors may need to interpret multiple results in order to provide you with the best plan of care or course of treatment. Therefore, we ask that you please give [place] 2 business days to thoroughly review all your results before contacting the office for clarification. Should we see a critical lab result, you will be contacted sooner.   If You Need Anything After Your Visit  If you have any questions or concerns for your doctor, please call our main line at [phone] and press option 4 to reach your doctor's medical assistant. If no one answers, please leave a voicemail as directed and we will return your call as soon as  possible. Messages left after 4 pm will be answered the following business day.   You may also send [place] a message via [place]. We typically respond to MyChart messages within 1-2 business days.  For prescription refills, please ask your pharmacy to  contact our office. Our fax number is [phone].  If you have an urgent issue when the clinic is closed that cannot wait until the next business day, you can page your doctor at the number below.    Please note that while we do our best to be available for urgent issues outside of office hours, we are not available 24/7.   If you have an urgent issue and are unable to reach [place], you may choose to seek medical care at your doctor's office, retail clinic, urgent care center, or emergency room.  If you have a medical emergency, please immediately call 911 or go to the emergency department.  Pager Numbers  - Dr. Nehemiah Massed: [phone]  - Dr. Laurence Ferrari: [phone]  - Dr. Nicole Kindred: [phone]  In the event of inclement weather, please call our main line at [phone] for an update on the status of any delays or closures.  Dermatology Medication Tips: Please keep the boxes that topical medications come in in order to help keep track of the instructions about where and how to use these. Pharmacies typically print the medication instructions only on the boxes and not directly on the medication tubes.   If your medication is too expensive, please contact our office at [phone] option 4 or send [place] a message through [place].   We are unable to tell what your co-pay for medications will be in advance as this is different depending on your insurance coverage. However, we may be able to find a substitute medication at lower cost or fill out paperwork to get insurance to cover a needed medication.   If a prior authorization is required to get your medication covered by your insurance company, please allow [place] 1-2 business days to complete this process.  Drug prices often vary depending on where the prescription is filled and some pharmacies may offer cheaper prices.  The website www.goodrx.com contains coupons for medications through different pharmacies. The prices here do not account for what  the cost may be with help from insurance (it may be cheaper with your insurance), but the website can give you the price if you did not use any insurance.  - You can print the associated coupon and take it with your prescription to the pharmacy.  - You may also stop by our office during regular business hours and pick up a GoodRx coupon card.  - If you need your prescription sent electronically to a different pharmacy, notify our office through [facility] or by phone at [phone] option 4.     Si Usted Necesita Algo Despus de Su Visita  Tambin puede enviarnos un mensaje a travs de Pharmacist, community. Por lo general respondemos a los mensajes de MyChart en el transcurso de 1 a 2 das hbiles.  Para renovar recetas, por favor pida a su farmacia que se ponga en contacto con nuestra oficina. Harland Dingwall de fax es [place] [phone].  Si tiene un asunto urgente cuando la clnica est cerrada y que no puede esperar hasta el siguiente da hbil, puede llamar/localizar a su doctor(a) al nmero que aparece a continuacin.   Por favor, tenga en cuenta que aunque hacemos todo lo posible para estar disponibles para asuntos urgentes fuera del horario de [place],  no estamos disponibles las 24 horas del da, los 7 das de la [place].   Si tiene un problema urgente y no puede comunicarse con nosotros, puede optar por buscar atencin mdica  en el consultorio de su doctor(a), en una clnica privada, en un centro de atencin urgente o en una sala de emergencias.  Si tiene Engineering geologist, por favor llame inmediatamente al 911 o vaya a la sala de emergencias.  Nmeros de bper  - Dr. Nehemiah Massed: [phone]  - Dra. Moye: [phone]  - Dra. Nicole Kindred: [phone]  En caso de inclemencias del [place], por favor llame a Johnsie Kindred principal al [phone] para una actualizacin sobre el [place] de cualquier retraso o cierre.  Consejos para la medicacin en dermatologa: Por favor, guarde las  cajas en las que vienen los medicamentos de uso tpico para ayudarle a seguir las instrucciones sobre dnde y cmo usarlos. Las farmacias generalmente imprimen las instrucciones del medicamento slo en las cajas y no directamente en los tubos del [place].   Si su medicamento es muy caro, por favor, pngase en contacto con Zigmund Daniel llamando al [phone] y presione la opcin 4 o envenos un mensaje a travs de Pharmacist, community.   No podemos decirle cul ser su copago por los medicamentos por adelantado ya que esto es diferente dependiendo de la cobertura de su seguro. Sin embargo, es posible que podamos encontrar un medicamento sustituto a Electrical engineer un formulario para que el seguro cubra el medicamento que se considera necesario.   Si se requiere una autorizacin previa para que su compaa de seguros [place] su medicamento, por favor permtanos de 1 a 2 das hbiles para completar este proceso.  Los precios de los medicamentos varan con frecuencia dependiendo del Environmental consultant de dnde se surte la receta y alguna farmacias pueden ofrecer precios ms baratos.  El sitio web www.goodrx.com tiene cupones para medicamentos de Airline pilot. Los precios aqu no tienen en cuenta lo que podra costar con la ayuda del seguro (puede ser ms barato con su seguro), pero el sitio web puede darle el precio si no utiliz Research scientist (physical sciences).  - Puede imprimir el cupn correspondiente y llevarlo con su receta a la farmacia.  - Tambin puede pasar por nuestra oficina durante el horario de atencin regular y Charity fundraiser una tarjeta de cupones de GoodRx.  - Si necesita que su receta se enve electrnicamente a una farmacia diferente, informe a nuestra oficina a travs de MyChart de [place] o por telfono llamando al [phone] y presione la opcin 4.

## 2022-12-26 NOTE — Progress Notes (Signed)
Follow-Up Visit   Subjective  Jessica Glenn is a 89 y.o. female who presents for the following: Skin Problem (Patient reports a spot she noticed a month ago at left neck she would like checked. ). The patient has spots, moles and lesions to be evaluated, some may be new or changing and the patient has concerns that these could be cancer.  The following portions of the chart were reviewed this encounter and updated as appropriate:  Tobacco  Allergies  Meds  Problems  Med Hx  Surg Hx  Fam Hx     Review of Systems: No other skin or systemic complaints except as noted in HPI or Assessment and Plan.  Objective  Well appearing patient in no apparent distress; mood and affect are within normal limits.  A focused examination was performed including left anterior neck. Relevant physical exam findings are noted in the Assessment and Plan.  Left Anterior Neck 1.1 cm flesh papule         Assessment & Plan  Neoplasm of uncertain behavior Left Anterior Neck Epidermal / dermal shaving  Lesion diameter (cm):  1.1 Informed consent: discussed and consent obtained   Timeout: patient name, date of birth, surgical site, and procedure verified   Procedure prep:  Patient was prepped and draped in usual sterile fashion Prep type:  Isopropyl alcohol Anesthesia: the lesion was anesthetized in a standard fashion   Anesthetic:  1% lidocaine w/ epinephrine 1-100,000 buffered w/ 8.4% NaHCO3 Instrument used: flexible razor blade   Hemostasis achieved with: pressure, aluminum chloride and electrodesiccation   Outcome: patient tolerated procedure well   Post-procedure details: sterile dressing applied and wound care instructions given   Dressing type: bandage and petrolatum    Destruction of lesion Complexity: extensive   Destruction method: electrodesiccation and curettage   Informed consent: discussed and consent obtained   Timeout:  patient name, date of birth, surgical site, and procedure  verified Procedure prep:  Patient was prepped and draped in usual sterile fashion Prep type:  Isopropyl alcohol Anesthesia: the lesion was anesthetized in a standard fashion   Anesthetic:  1% lidocaine w/ epinephrine 1-100,000 buffered w/ 8.4% NaHCO3 Curettage performed in three different directions: Yes   Electrodesiccation performed over the curetted area: Yes   Lesion length (cm):  1.1 Lesion width (cm):  1.1 Margin per side (cm):  0.2 Final wound size (cm):  1.5 Hemostasis achieved with:  pressure, aluminum chloride and electrodesiccation Outcome: patient tolerated procedure well with no complications   Post-procedure details: sterile dressing applied and wound care instructions given   Dressing type: bandage and petrolatum    Specimen 1 - Surgical pathology Differential Diagnosis: r/o isk vs scc  Check Margins: No Shv removal  Edc R/o isk vs   Seborrheic Keratoses - Stuck-on, waxy, tan-brown papules and/or plaques  - Benign-appearing - Discussed benign etiology and prognosis. - Observe - Call for any changes  Melanocytic Nevi - Tan-brown and/or pink-flesh-colored symmetric macules and papules - Benign appearing on exam today - Observation - Call clinic for new or changing moles - Recommend daily use of broad spectrum spf 30+ sunscreen to sun-exposed areas.   Actinic Damage - chronic, secondary to cumulative UV radiation exposure/sun exposure over time - diffuse scaly erythematous macules with underlying dyspigmentation - Recommend daily broad spectrum sunscreen SPF 30+ to sun-exposed areas, reapply every 2 hours as needed.  - Recommend staying in the shade or wearing long sleeves, sun glasses (UVA+UVB protection) and wide brim hats (4-inch  brim around the entire circumference of the hat). - Call for new or changing lesions.  Return for keep follow up as scheduled .  IRuthell Rummage, CMA, am acting as scribe for Sarina Ser, MD. Documentation: I have reviewed  the above documentation for accuracy and completeness, and I agree with the above.  Sarina Ser, MD

## 2023-01-02 ENCOUNTER — Telehealth: Payer: Self-pay

## 2023-01-02 NOTE — Telephone Encounter (Signed)
-----   Message from Ralene Bathe, MD sent at 12/29/2022  5:57 PM EST ----- Diagnosis Skin , left anterior neck VERRUCA VULGARIS, ENDOPHYTIC TYPE, CLOSE TO MARGIN  Benign viral wart Already treated May recur No further treatment needed unless recurs

## 2023-01-02 NOTE — Telephone Encounter (Signed)
LM on VM please return my call  

## 2023-01-03 ENCOUNTER — Encounter: Payer: Self-pay | Admitting: Dermatology

## 2023-02-02 ENCOUNTER — Ambulatory Visit (HOSPITAL_COMMUNITY)
Admission: RE | Admit: 2023-02-02 | Discharge: 2023-02-02 | Disposition: A | Discharge status: Home / Self Care | Payer: Medicare Other | Source: Ambulatory Visit | Attending: Radiation Oncology | Admitting: Radiation Oncology

## 2023-02-02 DIAGNOSIS — C801 Malignant (primary) neoplasm, unspecified: Secondary | ICD-10-CM | POA: Diagnosis not present

## 2023-02-02 DIAGNOSIS — C7949 Secondary malignant neoplasm of other parts of nervous system: Secondary | ICD-10-CM | POA: Diagnosis not present

## 2023-02-02 DIAGNOSIS — C7951 Secondary malignant neoplasm of bone: Secondary | ICD-10-CM | POA: Insufficient documentation

## 2023-02-02 DIAGNOSIS — M5134 Other intervertebral disc degeneration, thoracic region: Secondary | ICD-10-CM | POA: Diagnosis not present

## 2023-02-02 MED ORDER — GADOBUTROL 1 MMOL/ML IV SOLN
7.0000 mL | Freq: Once | INTRAVENOUS | Status: AC | PRN
  Administered 2023-02-02: 7 mL via INTRAVENOUS

## 2023-02-06 ENCOUNTER — Inpatient Hospital Stay: Payer: Medicare Other

## 2023-02-06 ENCOUNTER — Encounter: Payer: Self-pay | Admitting: Radiation Oncology

## 2023-02-06 ENCOUNTER — Ambulatory Visit
Admission: RE | Admit: 2023-02-06 | Discharge: 2023-02-06 | Disposition: A | Discharge status: Home / Self Care | Payer: Medicare Other | Source: Ambulatory Visit | Attending: Radiation Oncology | Admitting: Radiation Oncology

## 2023-02-06 DIAGNOSIS — C7951 Secondary malignant neoplasm of bone: Secondary | ICD-10-CM | POA: Diagnosis not present

## 2023-02-06 DIAGNOSIS — Z170 Estrogen receptor positive status [ER+]: Secondary | ICD-10-CM

## 2023-02-06 DIAGNOSIS — C50412 Malignant neoplasm of upper-outer quadrant of left female breast: Secondary | ICD-10-CM | POA: Diagnosis not present

## 2023-02-06 NOTE — Progress Notes (Signed)
Telephone nursing appointment for patient to review most recent scan results from 02/02/23 in reference to Progressive metastatic stage II-III, T3 N0 M1 grade 1 ER/PR Positive invasive ductal carcinoma of the left breast with a T2 lesion. I verified patient's identity and began nursing interview. Patient reports doing well. No other issues conveyed at this time.   Meaningful use complete.   Patient aware of their 2:30pm-02/06/23 telephone appointment w/ Laurence Aly PA-C. I left my extension [phone] in case patient needs anything. Patient verbalized understanding. This concludes the nursing interview.   Patient contact [phone]     Ruel Favors, LPN

## 2023-02-06 NOTE — Progress Notes (Signed)
Radiation Oncology         [phone]) [phone] ________________________________   Outpatient Follow Up - Conducted via telephone at patient request.  I spoke with the patient to conduct this  visit via telephone. The patient was notified in advance and was offered an in person or telemedicine meeting to allow for face to face communication but instead preferred to proceed with a telephone visit. ________________________________  Name: Jessica Glenn MRN: [id number]  Date of Service: 02/06/2023  DOB: 11/14/1928  Follow Up Note  CC: Elias Else, MD (Inactive)  Elias Else, MD  Diagnosis: Progressive metastatic stage II-III, T3 N0 M1 grade 1 ER/PR Positive invasive ductal carcinoma of the left breast with a T2 lesion  Interval Since Last Radiation:    07/05/2018 Stereotactic Radiosurgery: T2 Spine was treated to 18 Gy in 1 fraction  05/01/2018 - 06/15/2018: The patient initially received a dose of 50.4 Gy in 28 fractions to the left chest wall and supraclavicular region. This was delivered using a 3-D conformal, 4 field technique. The patient then received a boost to the mastectomy scar. This delivered an additional 10 Gy in 5 fractions using an en face electron field. The total dose was 60.4 Gy.  Narrative: In summary the patient was diagnosed with stage II or stage III breast disease initially, and on staging imaging was found to have a lytic appearing lesion at T2.  She went on to proceed with mastectomy of the left breast followed by postmastectomy radiation to the left chest wall and regional nodes.  She then went on to receive stereotactic radiosurgery to the T-spine and one fraction.   She has been followed in surveillance and she has been NED.  Of note some degenerative changes along T8-9 have been identified in her scans and felt to be stable over time since her treatment.  She has decided that she would prefer imaging serially at annual intervals rather than every 6 months as has been  previously offered.  Her most recent scan on 02/02/2023 showed stable posttreatment changes in the T2 vertebral body without any concerns of active disease.  She does have stable changes with a small focus of marrow abnormality in the right pedicle of T7, focus of enhancement within the posterior left T8 vertebral body and degenerative changes throughout the thoracic region worsening spondylosis at T11-12 with increased endplate edema and enhancement.  Facet arthritis at this level was also noted.  She is contacted today by phone to review these results.                On review of systems, the patient reports she is doing very well and enjoying the spring. She only has pain in her back after gardening that resolves. No other complaints are verbalized.   Past Medical History:  Past Medical History:  Diagnosis Date   Basal cell carcinoma    s/p Moh's surgery  to left side of nose   Breast cancer, left breast dx'd 05/2017   History of tobacco use    Squamous cell carcinoma of skin 11/18/2019   Left pretibial. KA-type. EDC   Varicose vein of leg    BLE    Past Surgical History: Past Surgical History:  Procedure Laterality Date   BREAST BIOPSY Left 05/2017   CATARACT EXTRACTION W/ INTRAOCULAR LENS  IMPLANT, BILATERAL Bilateral    DILATION AND CURETTAGE OF UTERUS     FRACTURE SURGERY     HARDWARE REMOVAL Right 02/14/2017   Procedure: RIGHT ANKLE  HARDWARE REMOVAL;  Surgeon: Sheral Apleyimothy D Murphy, MD;  Location: [facility];  Service: Orthopedics;  Laterality: Right;   I & D EXTREMITY Right 02/14/2017   Procedure: RIGHT IRRIGATION AND DEBRIDEMENT ANKLE;  Surgeon: Sheral Apleyimothy D Murphy, MD;  Location: [facility];  Service: Orthopedics;  Laterality: Right;   MASTECTOMY Left 02/14/2018   LEFT MASTECTOMY WITH DEEP LEFT AXILLARY SENTINEL LYMPH NODE BIOPSY    MASTECTOMY W/ SENTINEL NODE BIOPSY Left 02/14/2018   Procedure: LEFT MASTECTOMY WITH SENTINEL LYMPH NODE BIOPSY AND POSSIBLE  NODE DISSECTION;  Surgeon: Griselda Mineroth, Paul III, MD;  Location: [facility];  Service: General;  Laterality: Left;   MOHS SURGERY Left    "side of nose"   ORIF ANKLE FRACTURE Right 02/02/2016   Procedure: OPEN REDUCTION INTERNAL FIXATION (ORIF) ANKLE FRACTURE;  Surgeon: Sheral Apleyimothy D Murphy, MD;  Location: [facility];  Service: Orthopedics;  Laterality: Right;  strykermini c armreg bedbone foam    Social History:  Social History   Socioeconomic History   Marital status: Widowed    Spouse name: Not on file   Number of children: Not on file   Years of education: Not on file   Highest education level: Not on file  Occupational History   Not on file  Tobacco Use   Smoking status: Former    Packs/day: 1.00    Years: 8.00    Additional pack years: 0.00    Total pack years: 8.00    Types: Cigarettes    Quit date: 781960    Years since quitting: 64.3   Smokeless tobacco: Never  Vaping Use   Vaping Use: Never used  Substance and Sexual Activity   Alcohol use: Yes    Alcohol/week: 5.0 standard drinks of alcohol    Types: 5 Glasses of wine per week   Drug use: No   Sexual activity: Not Currently  Other Topics Concern   Not on file  Social History Narrative   Not on file   Social Determinants of Health   Financial Resource Strain: Not on file  Food Insecurity: No Food Insecurity (02/06/2023)   Hunger Vital Sign    Worried About Running Out of Food in the Last Year: Never true    Ran Out of Food in the Last Year: Never true  Transportation Needs: No Transportation Needs (02/06/2023)   PRAPARE - Administrator, Civil ServiceTransportation    Lack of Transportation (Medical): No    Lack of Transportation (Non-Medical): No  Physical Activity: Not on file  Stress: Not on file  Social Connections: Not on file  Intimate Partner Violence: Not At Risk (02/06/2023)   Humiliation, Afraid, Rape, and Kick questionnaire    Fear of Current or Ex-Partner: No    Emotionally Abused: No    Physically Abused: No    Sexually  Abused: No  The patient is widowed.  She has 4 sons.   [place]  Family History: Family History  Problem Relation Age of Onset   COPD Mother    Emphysema Mother      ALLERGIES:  has No Known Allergies.  Meds: Current Outpatient Medications  Medication Sig Dispense Refill   anastrozole (ARIMIDEX) 1 MG tablet TAKE 1 TABLET BY MOUTH DAILY 90 tablet 4   ascorbic acid (VITAMIN C) 1000 MG tablet Take 1,000 mg by mouth daily.     Calcium Carb-Cholecalciferol (CALCIUM 600 + D PO) Take 1 tablet by mouth daily.     Cholecalciferol (VITAMIN D3)  3000 units TABS Take 3,000 Units by mouth daily.     Cyanocobalamin (B-12) 5000 MCG CAPS Take 5,000 mcg by mouth daily.     diphenhydramine-acetaminophen (TYLENOL PM EXTRA STRENGTH) 25-500 MG TABS tablet 1-2 tablets     magnesium oxide (MAG-OX) 400 (241.3 Mg) MG tablet Take 400 mg by mouth daily.     Multiple Vitamin (MULTIVITAMIN WITH MINERALS) TABS tablet Take 1 tablet by mouth daily.     thiamine (VITAMIN B-1) 50 MG tablet Take 50 mg by mouth daily.     Zoledronic Acid (ZOMETA IV) Inject into the vein.     No current facility-administered medications for this encounter.    Physical Findings: Unable to assess due to nature of encounter   Lab Findings: Lab Results  Component Value Date   WBC 6.7 12/05/2022   HGB 14.5 12/05/2022   HCT 44.5 12/05/2022   MCV 91.4 12/05/2022   PLT 159 12/05/2022     Radiographic Findings: MR THORACIC SPINE W WO CONTRAST  Result Date: 02/06/2023 CLINICAL DATA:  Metastatic disease evaluation. Metastatic cancer to the spine. History of breast cancer. EXAM: MRI THORACIC WITHOUT AND WITH CONTRAST TECHNIQUE: Multiplanar and multiecho pulse sequences of the thoracic spine were obtained without and with intravenous contrast. CONTRAST:  16mL GADAVIST GADOBUTROL 1 MMOL/ML IV SOLN COMPARISON:  01/27/2022.  01/21/2021.  09/05/2019. FINDINGS: Alignment:  Normal Vertebrae: No change since the  prior examination. Post radiation fatty change of the upper thoracic spine. Treated lesion of the posterior T2 vertebral body without enlargement or change, consistent with successful post treatment residual findings. Small focus of marrow abnormality in the right pedicle of T7 is stable over time and benign. Small focus of enhancement within the posterior left T8 vertebral body is stable over time and benign. Or hypertrophic change of the posterior ligaments at the T8-9 level with low level enhancement is actually less noticeable than on the prior study. Degenerative spondylosis at T11-12 is worsened with increased degenerative endplate edema. This does not look like metastatic disease. Cord:  No cord compression or focal cord lesion. Paraspinal and other soft tissues: Negative Disc levels: Chronic disc degeneration throughout the lumbar region with desiccation of the discs and loss of disc height. Endplate Schmorl's nodes in many locations which are benign and unremarkable. As noted above, some worsening of degenerative change at the T11-12 disc level which could relate to regional pain. Disc bulges throughout the region, most pronounced at T11-12, but without visible neural compression. The T11-12 level also shows facet osteoarthritis with mild enhancement. IMPRESSION: 1. No change with respect to treated lesions since the study of 01/27/2022. 2. Treated lesion of the posterior T2 vertebral body without enlargement or change, consistent with successful post treatment residual findings. 3. Small focus of marrow abnormality in the right pedicle of T7 is stable over time and benign. 4. Small focus of enhancement within the posterior left T8 vertebral body is stable over time and benign. 5. Chronic degenerative disc disease throughout the thoracic region. Worsening of degenerative spondylosis at T11-12 with increased degenerative endplate edema and enhancement. This could relate to regional pain. Facet osteoarthritis  at this level is also associated with mild enhancement. Electronically Signed   By: Paulina Fusi M.D.   On: 02/06/2023 08:09     Impression/Plan: 1. Progressive Metastatic Stage II-III [id number], grade 1, ER/PR positive invasive ductal carcinoma of the left breast with disease to the thoracic spine.  The patient continues to clinically do very well and is quite  functional even given her age.  She continues to tolerate Arimidex, Zometa and follows with Dr. Al Pimple.  She also remains motivated to continue her surveillance at annual visit rather than 6 months though she is aware of the recommendations from [facility].  She will plan however to keep [place] informed of any symptoms or concerns that develop prior to her next visit which could prompt follow-up at a shorter interval.  This encounter was conducted via telephone.  The patient has provided two factor identification and has given verbal consent for this type of encounter and has been advised to only accept a meeting of this type in a secure network environment. The time spent during this encounter was 35 minutes including preparation, discussion, and coordination of the patient's care. The attendants for this meeting include  Ronny Bacon  and Norville Haggard.  During the encounter,  Ronny Bacon was located at [facility].  Norville Haggard was located at home.      Osker Mason, PAC

## 2023-02-07 ENCOUNTER — Telehealth: Payer: Self-pay

## 2023-02-07 NOTE — Telephone Encounter (Signed)
-----   Message from David C Kowalski, MD sent at 12/29/2022  5:57 PM EST ----- Diagnosis Skin , left anterior neck VERRUCA VULGARIS, ENDOPHYTIC TYPE, CLOSE TO MARGIN  Benign viral wart Already treated May recur No further treatment needed unless recurs 

## 2023-02-07 NOTE — Telephone Encounter (Signed)
Advised pt of bx result/sh ?

## 2023-02-10 DIAGNOSIS — C50912 Malignant neoplasm of unspecified site of left female breast: Secondary | ICD-10-CM | POA: Diagnosis not present

## 2023-02-27 ENCOUNTER — Inpatient Hospital Stay: Payer: Medicare Other | Attending: Hematology and Oncology | Admitting: Hematology and Oncology

## 2023-05-24 ENCOUNTER — Other Ambulatory Visit: Payer: Self-pay | Admitting: Radiation Therapy

## 2023-05-24 DIAGNOSIS — C7951 Secondary malignant neoplasm of bone: Secondary | ICD-10-CM

## 2023-06-02 ENCOUNTER — Other Ambulatory Visit: Payer: Self-pay | Admitting: *Deleted

## 2023-06-02 DIAGNOSIS — Z170 Estrogen receptor positive status [ER+]: Secondary | ICD-10-CM

## 2023-06-05 ENCOUNTER — Inpatient Hospital Stay: Payer: Medicare Other

## 2023-06-05 ENCOUNTER — Inpatient Hospital Stay: Payer: Medicare Other | Attending: Hematology and Oncology

## 2023-06-05 ENCOUNTER — Other Ambulatory Visit: Payer: Self-pay | Admitting: Hematology and Oncology

## 2023-06-05 ENCOUNTER — Other Ambulatory Visit: Payer: Self-pay

## 2023-06-05 VITALS — BP 172/74 | HR 66 | Temp 98.2°F | Wt 157.8 lb

## 2023-06-05 DIAGNOSIS — C7951 Secondary malignant neoplasm of bone: Secondary | ICD-10-CM | POA: Diagnosis not present

## 2023-06-05 DIAGNOSIS — C50812 Malignant neoplasm of overlapping sites of left female breast: Secondary | ICD-10-CM

## 2023-06-05 DIAGNOSIS — C50912 Malignant neoplasm of unspecified site of left female breast: Secondary | ICD-10-CM

## 2023-06-05 LAB — CBC WITH DIFFERENTIAL (CANCER CENTER ONLY)
Abs Immature Granulocytes: 0.02 10*3/uL (ref 0.00–0.07)
Basophils Absolute: 0 10*3/uL (ref 0.0–0.1)
Basophils Relative: 1 %
Eosinophils Absolute: 0.1 10*3/uL (ref 0.0–0.5)
Eosinophils Relative: 2 %
HCT: 42 % (ref 36.0–46.0)
Hemoglobin: 14.5 g/dL (ref 12.0–15.0)
Immature Granulocytes: 0 %
Lymphocytes Relative: 21 %
Lymphs Abs: 1.6 10*3/uL (ref 0.7–4.0)
MCH: 30.5 pg (ref 26.0–34.0)
MCHC: 34.5 g/dL (ref 30.0–36.0)
MCV: 88.2 fL (ref 80.0–100.0)
Monocytes Absolute: 0.6 10*3/uL (ref 0.1–1.0)
Monocytes Relative: 8 %
Neutro Abs: 5 10*3/uL (ref 1.7–7.7)
Neutrophils Relative %: 68 %
Platelet Count: 158 10*3/uL (ref 150–400)
RBC: 4.76 MIL/uL (ref 3.87–5.11)
RDW: 12.4 % (ref 11.5–15.5)
WBC Count: 7.3 10*3/uL (ref 4.0–10.5)
nRBC: 0 % (ref 0.0–0.2)

## 2023-06-05 LAB — CMP (CANCER CENTER ONLY)
ALT: 8 U/L (ref 0–44)
AST: 17 U/L (ref 15–41)
Albumin: 4.5 g/dL (ref 3.5–5.0)
Alkaline Phosphatase: 47 U/L (ref 38–126)
Anion gap: 7 (ref 5–15)
BUN: 23 mg/dL (ref 8–23)
CO2: 26 mmol/L (ref 22–32)
Calcium: 9.9 mg/dL (ref 8.9–10.3)
Chloride: 106 mmol/L (ref 98–111)
Creatinine: 0.99 mg/dL (ref 0.44–1.00)
GFR, Estimated: 53 mL/min — ABNORMAL LOW (ref >60)
Glucose, Bld: 102 mg/dL — ABNORMAL HIGH (ref 70–99)
Potassium: 4.3 mmol/L (ref 3.5–5.1)
Sodium: 139 mmol/L (ref 135–145)
Total Bilirubin: 0.7 mg/dL (ref 0.3–1.2)
Total Protein: 7.2 g/dL (ref 6.5–8.1)

## 2023-06-05 MED ORDER — ZOLEDRONIC ACID 4 MG/100ML IV SOLN
4.0000 mg | Freq: Once | INTRAVENOUS | Status: AC
  Administered 2023-06-05: 4 mg via INTRAVENOUS
  Filled 2023-06-05: qty 100

## 2023-06-05 MED ORDER — SODIUM CHLORIDE 0.9 % IV SOLN: Freq: Once | INTRAVENOUS | Status: DC

## 2023-06-05 NOTE — Patient Instructions (Signed)

## 2023-06-28 ENCOUNTER — Ambulatory Visit
Admission: RE | Admit: 2023-06-28 | Discharge: 2023-06-28 | Disposition: A | Discharge status: Home / Self Care | Payer: Medicare Other | Source: Ambulatory Visit | Attending: Hematology and Oncology | Admitting: Hematology and Oncology

## 2023-06-28 DIAGNOSIS — Z1231 Encounter for screening mammogram for malignant neoplasm of breast: Secondary | ICD-10-CM | POA: Diagnosis not present

## 2023-06-28 DIAGNOSIS — Z170 Estrogen receptor positive status [ER+]: Secondary | ICD-10-CM

## 2023-07-13 ENCOUNTER — Ambulatory Visit (INDEPENDENT_AMBULATORY_CARE_PROVIDER_SITE_OTHER): Payer: Medicare Other | Admitting: Dermatology

## 2023-07-13 VITALS — BP 151/75

## 2023-07-13 DIAGNOSIS — I8393 Asymptomatic varicose veins of bilateral lower extremities: Secondary | ICD-10-CM | POA: Diagnosis not present

## 2023-07-13 DIAGNOSIS — Z8619 Personal history of other infectious and parasitic diseases: Secondary | ICD-10-CM

## 2023-07-13 DIAGNOSIS — Z85828 Personal history of other malignant neoplasm of skin: Secondary | ICD-10-CM

## 2023-07-13 DIAGNOSIS — D1801 Hemangioma of skin and subcutaneous tissue: Secondary | ICD-10-CM | POA: Diagnosis not present

## 2023-07-13 DIAGNOSIS — I781 Nevus, non-neoplastic: Secondary | ICD-10-CM

## 2023-07-13 DIAGNOSIS — L817 Pigmented purpuric dermatosis: Secondary | ICD-10-CM | POA: Diagnosis not present

## 2023-07-13 DIAGNOSIS — D229 Melanocytic nevi, unspecified: Secondary | ICD-10-CM

## 2023-07-13 DIAGNOSIS — Z8589 Personal history of malignant neoplasm of other organs and systems: Secondary | ICD-10-CM

## 2023-07-13 DIAGNOSIS — Z7189 Other specified counseling: Secondary | ICD-10-CM

## 2023-07-13 DIAGNOSIS — L814 Other melanin hyperpigmentation: Secondary | ICD-10-CM | POA: Diagnosis not present

## 2023-07-13 DIAGNOSIS — D492 Neoplasm of unspecified behavior of bone, soft tissue, and skin: Secondary | ICD-10-CM

## 2023-07-13 DIAGNOSIS — W908XXA Exposure to other nonionizing radiation, initial encounter: Secondary | ICD-10-CM | POA: Diagnosis not present

## 2023-07-13 DIAGNOSIS — Z853 Personal history of malignant neoplasm of breast: Secondary | ICD-10-CM

## 2023-07-13 DIAGNOSIS — Z1283 Encounter for screening for malignant neoplasm of skin: Secondary | ICD-10-CM | POA: Diagnosis not present

## 2023-07-13 DIAGNOSIS — L578 Other skin changes due to chronic exposure to nonionizing radiation: Secondary | ICD-10-CM

## 2023-07-13 DIAGNOSIS — L821 Other seborrheic keratosis: Secondary | ICD-10-CM | POA: Diagnosis not present

## 2023-07-13 NOTE — Progress Notes (Signed)
Follow-Up Visit   Subjective  Jessica Glenn is a 89 y.o. female who presents for the following: Skin Cancer Screening and Full Body Skin Exam, hx of BCC, hx of SCC, check spot upper lip, has been there for yrs  The patient presents for Total-Body Skin Exam (TBSE) for skin cancer screening and mole check. The patient has spots, moles and lesions to be evaluated, some may be new or changing and the patient may have concern these could be cancer.    The following portions of the chart were reviewed this encounter and updated as appropriate: medications, allergies, medical history  Review of Systems:  No other skin or systemic complaints except as noted in HPI or Assessment and Plan.  Objective  Well appearing patient in no apparent distress; mood and affect are within normal limits.  A full examination was performed including scalp, head, eyes, ears, nose, lips, neck, chest, axillae, abdomen, back, buttocks, bilateral upper extremities, bilateral lower extremities, hands, feet, fingers, toes, fingernails, and toenails. All findings within normal limits unless otherwise noted below.   Relevant physical exam findings are noted in the Assessment and Plan.  R upper lip Flesh colored pap, dermoscopy suspicious for [facility]    Assessment & Plan   SKIN CANCER SCREENING PERFORMED TODAY.  ACTINIC DAMAGE - Chronic condition, secondary to cumulative UV/sun exposure - diffuse scaly erythematous macules with underlying dyspigmentation - Recommend daily broad spectrum sunscreen SPF 30+ to sun-exposed areas, reapply every 2 hours as needed.  - Staying in the shade or wearing long sleeves, sun glasses (UVA+UVB protection) and wide brim hats (4-inch brim around the entire circumference of the hat) are also recommended for sun protection.  - Call for new or changing lesions.  LENTIGINES, SEBORRHEIC KERATOSES, HEMANGIOMAS - Benign normal skin lesions - Benign-appearing - Call for any  changes  MELANOCYTIC NEVI - Tan-brown and/or pink-flesh-colored symmetric macules and papules - Benign appearing on exam today - Observation - Call clinic for new or changing moles - Recommend daily use of broad spectrum spf 30+ sunscreen to sun-exposed areas.   HISTORY OF BASAL CELL CARCINOMA OF THE SKIN - No evidence of recurrence today - Recommend regular full body skin exams - Recommend daily broad spectrum sunscreen SPF 30+ to sun-exposed areas, reapply every 2 hours as needed.  - Call if any new or changing lesions are noted between office visits  - L side of nose  HISTORY OF SQUAMOUS CELL CARCINOMA OF THE SKIN - No evidence of recurrence today - No lymphadenopathy - Recommend regular full body skin exams - Recommend daily broad spectrum sunscreen SPF 30+ to sun-exposed areas, reapply every 2 hours as needed.  - Call if any new or changing lesions are noted between office visits -L pretibial, KA type    Neoplasm of skin R upper lip R/O BCC, will plan bx on f/u = patient is a wedding this weekend and does not want to have a procedure today. Dermoscopy suspicious for BCC.  Dr. Katrinka Blazing evaluated and agrees.  HISTORY of BREAST CANCER L breast Exam: L breast clear to visual exam, no lymphadenopathy No lymphadenopathy. Treatment Plan: Observe for changes/recurrence  Varicose Veins/Spider Veins - lower legs - Dilated blue, purple or red veins at the lower extremities - Reassured - Smaller vessels can be treated by sclerotherapy (a procedure to inject a medicine into the veins to make them disappear) if desired, but the treatment is not covered by insurance. Larger vessels may be covered if symptomatic and  we would refer to vascular surgeon if treatment desired.   SCHAMBERG'S PURPURA Lower legs Exam: stasis changes lower legs Treatment Plan: Benign, observe.    HISTORY OF VIRAL WART L ant neck, bx proven Exam: L ant neck clear Treatment Plan: Clear, observe for  recurrence   Return in about 6 weeks (around 08/24/2023) for bx of R upper lip, 1 yr TBSE, Hx of BCC, Hx of SCC.  I, Ardis Rowan, RMA, am acting as scribe for Armida Sans, MD .  Documentation: I have reviewed the above documentation for accuracy and completeness, and I agree with the above.  Armida Sans, MD

## 2023-07-13 NOTE — Patient Instructions (Signed)

## 2023-07-15 ENCOUNTER — Encounter: Payer: Self-pay | Admitting: Dermatology

## 2023-07-20 ENCOUNTER — Telehealth: Payer: Self-pay | Admitting: Radiation Therapy

## 2023-07-20 NOTE — Telephone Encounter (Signed)
I spoke with Jessica Glenn about her upcoming spine MRI and telephone follow-up with [name] in April 2025. She has this information and was thankful for the call.   Jalene Mullet R.T.(R)(T) Radiation Special Procedures Navigator

## 2023-08-25 DIAGNOSIS — E7800 Pure hypercholesterolemia, unspecified: Secondary | ICD-10-CM | POA: Diagnosis not present

## 2023-08-25 DIAGNOSIS — H02402 Unspecified ptosis of left eyelid: Secondary | ICD-10-CM | POA: Diagnosis not present

## 2023-08-25 DIAGNOSIS — N1831 Chronic kidney disease, stage 3a: Secondary | ICD-10-CM | POA: Diagnosis not present

## 2023-08-25 DIAGNOSIS — F5101 Primary insomnia: Secondary | ICD-10-CM | POA: Diagnosis not present

## 2023-08-25 DIAGNOSIS — C50912 Malignant neoplasm of unspecified site of left female breast: Secondary | ICD-10-CM | POA: Diagnosis not present

## 2023-08-25 DIAGNOSIS — Z0000 Encounter for general adult medical examination without abnormal findings: Secondary | ICD-10-CM | POA: Diagnosis not present

## 2023-08-25 DIAGNOSIS — I700 Atherosclerosis of aorta: Secondary | ICD-10-CM | POA: Diagnosis not present

## 2023-08-25 DIAGNOSIS — Z79899 Other long term (current) drug therapy: Secondary | ICD-10-CM | POA: Diagnosis not present

## 2023-08-25 DIAGNOSIS — C7951 Secondary malignant neoplasm of bone: Secondary | ICD-10-CM | POA: Diagnosis not present

## 2023-08-30 ENCOUNTER — Ambulatory Visit: Payer: Medicare Other | Admitting: Dermatology

## 2023-08-30 DIAGNOSIS — W908XXA Exposure to other nonionizing radiation, initial encounter: Secondary | ICD-10-CM | POA: Diagnosis not present

## 2023-08-30 DIAGNOSIS — C4401 Basal cell carcinoma of skin of lip: Secondary | ICD-10-CM

## 2023-08-30 DIAGNOSIS — L578 Other skin changes due to chronic exposure to nonionizing radiation: Secondary | ICD-10-CM | POA: Diagnosis not present

## 2023-08-30 DIAGNOSIS — D492 Neoplasm of unspecified behavior of bone, soft tissue, and skin: Secondary | ICD-10-CM | POA: Diagnosis not present

## 2023-08-30 DIAGNOSIS — C4491 Basal cell carcinoma of skin, unspecified: Secondary | ICD-10-CM

## 2023-08-30 HISTORY — DX: Basal cell carcinoma of skin, unspecified: C44.91

## 2023-08-30 NOTE — Progress Notes (Signed)
   Follow-Up Visit   Subjective  Jessica Glenn is a 89 y.o. female who presents for the following: Irregular skin lesion on the R upper lip, patient here today for bx.   The patient has spots, moles and lesions to be evaluated, some may be new or changing and the patient may have concern these could be cancer.   The following portions of the chart were reviewed this encounter and updated as appropriate: medications, allergies, medical history  Review of Systems:  No other skin or systemic complaints except as noted in HPI or Assessment and Plan.  Objective  Well appearing patient in no apparent distress; mood and affect are within normal limits.  within normal limits unless otherwise noted below.  A focused examination was performed of the following areas: the face   Relevant exam findings are noted in the Assessment and Plan.  R upper lip 0.8 cm pink papule.       Assessment & Plan   ACTINIC DAMAGE - chronic, secondary to cumulative UV radiation exposure/sun exposure over time - diffuse scaly erythematous macules with underlying dyspigmentation - Recommend daily broad spectrum sunscreen SPF 30+ to sun-exposed areas, reapply every 2 hours as needed.  - Recommend staying in the shade or wearing long sleeves, sun glasses (UVA+UVB protection) and wide brim hats (4-inch brim around the entire circumference of the hat). - Call for new or changing lesions.   Neoplasm of skin R upper lip  Epidermal / dermal shaving  Lesion diameter (cm):  0.8 Informed consent: discussed and consent obtained   Timeout: patient name, date of birth, surgical site, and procedure verified   Procedure prep:  Patient was prepped and draped in usual sterile fashion Prep type:  Isopropyl alcohol Anesthesia: the lesion was anesthetized in a standard fashion   Anesthetic:  1% lidocaine w/ epinephrine 1-100,000 buffered w/ 8.4% NaHCO3 Instrument used: flexible razor blade   Hemostasis achieved with:  pressure, aluminum chloride and electrodesiccation   Outcome: patient tolerated procedure well   Post-procedure details: sterile dressing applied and wound care instructions given   Dressing type: bandage and petrolatum    Destruction of lesion Complexity: extensive   Destruction method: electrodesiccation and curettage   Informed consent: discussed and consent obtained   Timeout:  patient name, date of birth, surgical site, and procedure verified Procedure prep:  Patient was prepped and draped in usual sterile fashion Prep type:  Isopropyl alcohol Anesthesia: the lesion was anesthetized in a standard fashion   Anesthetic:  1% lidocaine w/ epinephrine 1-100,000 buffered w/ 8.4% NaHCO3 Curettage performed in three different directions: Yes   Electrodesiccation performed over the curetted area: Yes   Lesion length (cm):  0.8 Lesion width (cm):  0.8 Margin per side (cm):  0.2 Final wound size (cm):  1.2 Hemostasis achieved with:  pressure, aluminum chloride and electrodesiccation Outcome: patient tolerated procedure well with no complications   Post-procedure details: sterile dressing applied and wound care instructions given   Dressing type: bandage and petrolatum    Specimen 1 - Surgical pathology Differential Diagnosis: D48.5 r/o [facility] ED&C Check Margins: No   Return for appointment as scheduled.  Maylene Roes, CMA, am acting as scribe for Armida Sans, MD .   Documentation: I have reviewed the above documentation for accuracy and completeness, and I agree with the above.  Armida Sans, MD

## 2023-08-30 NOTE — Patient Instructions (Signed)

## 2023-09-05 ENCOUNTER — Encounter: Payer: Self-pay | Admitting: Dermatology

## 2023-09-05 LAB — SURGICAL PATHOLOGY

## 2023-09-06 ENCOUNTER — Telehealth: Payer: Self-pay

## 2023-09-06 NOTE — Telephone Encounter (Signed)
Patient informed of pathology results 

## 2023-09-06 NOTE — Telephone Encounter (Signed)
-----   Message from Jessica Glenn sent at 09/05/2023  6:21 PM EST ----- FINAL DIAGNOSIS        1. Skin, right upper lip :       BASAL CELL CARCINOMA, INFUNDIBULOCYSTIC TYPE   Cancer = BCC Already treated Recheck next visit

## 2023-10-04 ENCOUNTER — Other Ambulatory Visit: Payer: Self-pay | Admitting: Adult Health

## 2023-12-05 DIAGNOSIS — H02423 Myogenic ptosis of bilateral eyelids: Secondary | ICD-10-CM | POA: Diagnosis not present

## 2023-12-05 DIAGNOSIS — H02411 Mechanical ptosis of right eyelid: Secondary | ICD-10-CM | POA: Diagnosis not present

## 2023-12-05 DIAGNOSIS — H02422 Myogenic ptosis of left eyelid: Secondary | ICD-10-CM | POA: Diagnosis not present

## 2023-12-05 DIAGNOSIS — H57813 Brow ptosis, bilateral: Secondary | ICD-10-CM | POA: Diagnosis not present

## 2023-12-05 DIAGNOSIS — H0279 Other degenerative disorders of eyelid and periocular area: Secondary | ICD-10-CM | POA: Diagnosis not present

## 2023-12-05 DIAGNOSIS — H02412 Mechanical ptosis of left eyelid: Secondary | ICD-10-CM | POA: Diagnosis not present

## 2023-12-05 DIAGNOSIS — H02831 Dermatochalasis of right upper eyelid: Secondary | ICD-10-CM | POA: Diagnosis not present

## 2023-12-05 DIAGNOSIS — H02413 Mechanical ptosis of bilateral eyelids: Secondary | ICD-10-CM | POA: Diagnosis not present

## 2023-12-05 DIAGNOSIS — H53483 Generalized contraction of visual field, bilateral: Secondary | ICD-10-CM | POA: Diagnosis not present

## 2023-12-05 DIAGNOSIS — H02834 Dermatochalasis of left upper eyelid: Secondary | ICD-10-CM | POA: Diagnosis not present

## 2023-12-05 DIAGNOSIS — H02421 Myogenic ptosis of right eyelid: Secondary | ICD-10-CM | POA: Diagnosis not present

## 2023-12-05 DIAGNOSIS — Z01818 Encounter for other preprocedural examination: Secondary | ICD-10-CM | POA: Diagnosis not present

## 2023-12-06 ENCOUNTER — Other Ambulatory Visit: Payer: Self-pay | Admitting: *Deleted

## 2023-12-06 DIAGNOSIS — Z170 Estrogen receptor positive status [ER+]: Secondary | ICD-10-CM

## 2023-12-06 DIAGNOSIS — C7951 Secondary malignant neoplasm of bone: Secondary | ICD-10-CM

## 2023-12-06 DIAGNOSIS — C50912 Malignant neoplasm of unspecified site of left female breast: Secondary | ICD-10-CM

## 2023-12-07 ENCOUNTER — Inpatient Hospital Stay: Payer: Medicare Other

## 2023-12-07 ENCOUNTER — Encounter: Payer: Self-pay | Admitting: Hematology and Oncology

## 2023-12-07 ENCOUNTER — Inpatient Hospital Stay: Payer: Medicare Other | Attending: Hematology and Oncology | Admitting: Hematology and Oncology

## 2023-12-07 ENCOUNTER — Other Ambulatory Visit: Payer: Self-pay | Admitting: Pharmacist

## 2023-12-07 VITALS — BP 181/72 | HR 72 | Temp 98.0°F | Resp 16 | Wt 159.3 lb

## 2023-12-07 VITALS — BP 163/70 | HR 63 | Temp 97.8°F | Resp 18

## 2023-12-07 DIAGNOSIS — C7951 Secondary malignant neoplasm of bone: Secondary | ICD-10-CM

## 2023-12-07 DIAGNOSIS — C50912 Malignant neoplasm of unspecified site of left female breast: Secondary | ICD-10-CM

## 2023-12-07 DIAGNOSIS — C50812 Malignant neoplasm of overlapping sites of left female breast: Secondary | ICD-10-CM

## 2023-12-07 DIAGNOSIS — C50112 Malignant neoplasm of central portion of left female breast: Secondary | ICD-10-CM | POA: Diagnosis not present

## 2023-12-07 DIAGNOSIS — Z170 Estrogen receptor positive status [ER+]: Secondary | ICD-10-CM

## 2023-12-07 DIAGNOSIS — M8580 Other specified disorders of bone density and structure, unspecified site: Secondary | ICD-10-CM | POA: Insufficient documentation

## 2023-12-07 LAB — CBC WITH DIFFERENTIAL (CANCER CENTER ONLY)
Abs Immature Granulocytes: 0.01 10*3/uL (ref 0.00–0.07)
Basophils Absolute: 0 10*3/uL (ref 0.0–0.1)
Basophils Relative: 1 %
Eosinophils Absolute: 0.2 10*3/uL (ref 0.0–0.5)
Eosinophils Relative: 3 %
HCT: 43.4 % (ref 36.0–46.0)
Hemoglobin: 14.1 g/dL (ref 12.0–15.0)
Immature Granulocytes: 0 %
Lymphocytes Relative: 26 %
Lymphs Abs: 1.6 10*3/uL (ref 0.7–4.0)
MCH: 29.7 pg (ref 26.0–34.0)
MCHC: 32.5 g/dL (ref 30.0–36.0)
MCV: 91.4 fL (ref 80.0–100.0)
Monocytes Absolute: 0.6 10*3/uL (ref 0.1–1.0)
Monocytes Relative: 9 %
Neutro Abs: 3.6 10*3/uL (ref 1.7–7.7)
Neutrophils Relative %: 61 %
Platelet Count: 150 10*3/uL (ref 150–400)
RBC: 4.75 MIL/uL (ref 3.87–5.11)
RDW: 12.5 % (ref 11.5–15.5)
WBC Count: 6 10*3/uL (ref 4.0–10.5)
nRBC: 0 % (ref 0.0–0.2)

## 2023-12-07 LAB — CMP (CANCER CENTER ONLY)
ALT: 8 U/L (ref 0–44)
AST: 16 U/L (ref 15–41)
Albumin: 4.5 g/dL (ref 3.5–5.0)
Alkaline Phosphatase: 48 U/L (ref 38–126)
Anion gap: 5 (ref 5–15)
BUN: 23 mg/dL (ref 8–23)
CO2: 27 mmol/L (ref 22–32)
Calcium: 9.8 mg/dL (ref 8.9–10.3)
Chloride: 107 mmol/L (ref 98–111)
Creatinine: 0.97 mg/dL (ref 0.44–1.00)
GFR, Estimated: 55 mL/min — ABNORMAL LOW (ref >60)
Glucose, Bld: 94 mg/dL (ref 70–99)
Potassium: 4.7 mmol/L (ref 3.5–5.1)
Sodium: 139 mmol/L (ref 135–145)
Total Bilirubin: 0.6 mg/dL (ref 0.0–1.2)
Total Protein: 7.3 g/dL (ref 6.5–8.1)

## 2023-12-07 MED ORDER — SODIUM CHLORIDE 0.9 % IV SOLN: INTRAVENOUS | Status: DC

## 2023-12-07 MED ORDER — ZOLEDRONIC ACID 4 MG/100ML IV SOLN
4.0000 mg | Freq: Once | INTRAVENOUS | Status: AC
  Administered 2023-12-07: 4 mg via INTRAVENOUS
  Filled 2023-12-07: qty 100

## 2023-12-07 NOTE — Progress Notes (Signed)
 [facility]  [phone] Fax:([phone]) [phone]     ID: Jessica Glenn DOB: 13-Mar-1929  MR#: [id number]  RDW#:[id number]  Patient Care Team: Gib Charleston, MD as PCP - General (Family Medicine) Curvin Deward MOULD, MD as Consulting Physician (General Surgery) Dewey Rush, MD as Consulting Physician (Radiation Oncology) OTHER MD:  CHIEF COMPLAINT: Locally advanced/metastatic estrogen receptor positive breast cancer (s/p left mastectomy)  CURRENT TREATMENT: Anastrozole ; zoledronate  INTERVAL HISTORY:  Discussed the use of AI scribe software for clinical note transcription with the patient, who gave verbal consent to proceed.  History of Present Illness    Jessica Glenn is a 89 year old female with a history of breast cancer and bone lesions who presents for a routine follow-up visit.  She has a history of breast cancer, treated with a left mastectomy. In 2019, bone lesions were identified and treated with laser therapy as they could not be biopsied. The lesions were suspected to be either arthritis or cancer. She has been on a bone strengthener, Zometa , since 2019, taking it every 6 months without issues. She is considering reducing the frequency of this treatment.  She is planning to undergo surgery for ptosis, which affects her vision. Lifting her eyelids with her hand improves her vision.  She uses hearing aids and reports no significant issues with driving. She notes a little swelling in her legs. She has regular blood work, which she states looks great. She is otherwise very compliant with anastrozole .  HISTORY OF CURRENT ILLNESS: From the original intake note:  Jessica Glenn tells me she first noted a changes in her left breast about a year and a half ago. She had had some dental work around that time and thought possibly that could be related. She said her breast felt like steel. She did not bring it to medical attention until her recent appointment with Dr. Gib and he  set her up for bilateral diagnostic mammography with tomography and [facility]. This found the breast density to be category C. In the upper outer retroareolar left breast there was an irregular mass estimated to be at least 6 cm by mammography. There were heterogeneous calcifications within the mass. There was skin thickening of the areola and an enlarged inferior left axillary lymph node was noted. On exam there was a large fixed hard mass in the upper outer quadrant of the left breast causing protrusion of the areola. It measured approximately 7 cm by palpation and there was visible skin thickening. In the inferior left axilla there was a firm palpable mass measuring 2 cm.  Ultrasound confirmed an irregular hypoechoic vascular mass in the left breast centered on the 1:00 position 4 cm from the nipple measuring 6.7 cm. This was abutting the overlying skin. It was a 2.3 cm hypoechoic vascular mass in the left axilla. The right breast was unremarkable.  On 06/22/2017 the patient had left breast and left axillary node biopsy, showing ([id number]) both sites to be involved by invasive ductal carcinoma, grade 2, with prognostic panels on both biopsies showing estrogen receptor 100% positivity, progesterone receptor 40-50% positivity, all with strong staining intensity, MIB-1:30 percent in the breast and 15% in the lymph nodes, and both HER-2 negative, with ratios of 0.76-0.85 and number per cell 1.10-1.30.  The patient's subsequent history is as detailed below.   PAST MEDICAL HISTORY: Past Medical History:  Diagnosis Date   Basal cell carcinoma    s/p Moh's surgery  to left side of nose   Basal cell carcinoma 08/30/2023   R upper lip, ED&C   Breast cancer, left breast (HCC) dx'd 05/2017   History of tobacco use    Squamous cell carcinoma of skin 11/18/2019   Left pretibial. KA-type. EDC   Varicose vein of leg    BLE    PAST SURGICAL HISTORY: Past  Surgical History:  Procedure Laterality Date   BREAST BIOPSY Left 05/2017   CATARACT EXTRACTION W/ INTRAOCULAR LENS  IMPLANT, BILATERAL Bilateral    DILATION AND CURETTAGE OF UTERUS     FRACTURE SURGERY     HARDWARE REMOVAL Right 02/14/2017   Procedure: RIGHT ANKLE HARDWARE REMOVAL;  Surgeon: Evalene JONETTA Chancy, MD;  Location: [facility];  Service: Orthopedics;  Laterality: Right;   I & D EXTREMITY Right 02/14/2017   Procedure: RIGHT IRRIGATION AND DEBRIDEMENT ANKLE;  Surgeon: Evalene JONETTA Chancy, MD;  Location: [facility];  Service: Orthopedics;  Laterality: Right;   MASTECTOMY Left 02/14/2018   LEFT MASTECTOMY WITH DEEP LEFT AXILLARY SENTINEL LYMPH NODE BIOPSY    MASTECTOMY W/ SENTINEL NODE BIOPSY Left 02/14/2018   Procedure: LEFT MASTECTOMY WITH SENTINEL LYMPH NODE BIOPSY AND POSSIBLE NODE DISSECTION;  Surgeon: Curvin Deward MOULD, MD;  Location: [facility];  Service: General;  Laterality: Left;   MOHS SURGERY Left    side of nose   ORIF ANKLE FRACTURE Right 02/02/2016   Procedure: OPEN REDUCTION INTERNAL FIXATION (ORIF) ANKLE FRACTURE;  Surgeon: Evalene JONETTA Chancy, MD;  Location: [facility];  Service: Orthopedics;  Laterality: Right;  strykermini c armreg bedbone foam    FAMILY HISTORY Family History  Problem Relation Age of Onset   COPD Mother    Emphysema Mother   The patient's father had a history of alcoholism and a band in the family. The patient has no information on the paternal side. The patient's mother died from emphysema at the age of 68. The patient has one sister, 2 brothers. There is no history of breast or ovarian cancer in the family to her knowledge.   GYNECOLOGIC HISTORY:  No LMP recorded. Patient is postmenopausal. Menarche age 28, first live birth age 59, the patient is GX P4. She went through the change of life in her 4s. She took hormone replacement for a few years.    SOCIAL HISTORY:  The patient is originally from [place] . Her  husband Jeralyn is a traveling education officer, environmental, nondenominational. Son Jeralyn lives in [place] where he works as a education officer, environmental; son Garrel lives in [place] where he works as an airline pilot; son Garnette lives in [place] where he works as a surveyor, minerals; son Donnice also lives in [place] and works as a surveyor, minerals. The patient has 14 grandchildren and 17 great-grandchildren    ADVANCED DIRECTIVES: In place   HEALTH MAINTENANCE: Social History   Tobacco Use   Smoking status: Former    Current packs/day: 0.00    Average packs/day: 1 pack/day for 8.0 years (8.0 ttl pk-yrs)    Types: Cigarettes    Start date: 76    Quit date: 12    Years since quitting: 65.1   Smokeless tobacco: Never  Vaping Use   Vaping status: Never Used  Substance Use Topics   Alcohol use: Yes    Alcohol/week: 5.0 standard drinks of alcohol    Types: 5 Glasses of wine per week   Drug use: No     Colonoscopy: Never  PAP:  Bone density: Osteopenia   No  Known Allergies  Current Outpatient Medications  Medication Sig Dispense Refill   anastrozole  (ARIMIDEX ) 1 MG tablet TAKE 1 TABLET BY MOUTH DAILY 90 tablet 4   ascorbic acid (VITAMIN C ) 1000 MG tablet Take 1,000 mg by mouth daily.     Calcium  Carb-Cholecalciferol  (CALCIUM  600 + D PO) Take 1 tablet by mouth daily.     Cholecalciferol  (VITAMIN D3) 3000 units TABS Take 3,000 Units by mouth daily.     Cyanocobalamin (B-12) 5000 MCG CAPS Take 5,000 mcg by mouth daily.     diphenhydramine-acetaminophen  (TYLENOL  PM EXTRA STRENGTH) 25-500 MG TABS tablet 1-2 tablets     magnesium oxide (MAG-OX) 400 (241.3 Mg) MG tablet Take 400 mg by mouth daily.     Multiple Vitamin (MULTIVITAMIN WITH MINERALS) TABS tablet Take 1 tablet by mouth daily.     thiamine (VITAMIN B-1) 50 MG tablet Take 50 mg by mouth daily.     Zoledronic  Acid (ZOMETA  IV) Inject into the vein.     No current facility-administered medications for this visit.    OBJECTIVE: White woman who appears well  Vitals:    12/07/23 1320  BP: (!) 181/72  Pulse: 72  Resp: 16  Temp: 98 F (36.7 C)  SpO2: 97%    Wt Readings from Last 3 Encounters:  12/07/23 159 lb 4.8 oz (72.3 kg)  06/05/23 157 lb 12.8 oz (71.6 kg)  12/05/22 156 lb 3.2 oz (70.9 kg)   Body mass index is 26.51 kg/m.    ECOG FS:1 - Symptomatic but completely ambulatory GENERAL: Patient is a well appearing female in no acute distress HEENT:  Sclerae anicteric.  Oropharynx clear and moist. No ulcerations or evidence of oropharyngeal candidiasis. Neck is supple.  NODES:  No cervical, supraclavicular, or axillary lymphadenopathy palpated.  BREAST EXAM:  left breast s/p mastectomy, no sign of local recurrence right breast benign.  No change No change in exam from last time. No LE edema.  LAB RESULTS  CMP     Component Value Date/Time   NA 139 06/05/2023 1341   NA 140 10/27/2017 0908   K 4.3 06/05/2023 1341   K 4.8 10/27/2017 0908   CL 106 06/05/2023 1341   CO2 26 06/05/2023 1341   CO2 27 10/27/2017 0908   GLUCOSE 102 (H) 06/05/2023 1341   GLUCOSE 96 10/27/2017 0908   BUN 23 06/05/2023 1341   BUN 20.5 10/27/2017 0908   CREATININE 0.99 06/05/2023 1341   CREATININE 0.9 10/27/2017 0908   CALCIUM  9.9 06/05/2023 1341   CALCIUM  9.7 10/27/2017 0908   PROT 7.2 06/05/2023 1341   PROT 7.2 10/27/2017 0908   ALBUMIN 4.5 06/05/2023 1341   ALBUMIN 4.2 10/27/2017 0908   AST 17 06/05/2023 1341   AST 19 10/27/2017 0908   ALT 8 06/05/2023 1341   ALT 8 10/27/2017 0908   ALKPHOS 47 06/05/2023 1341   ALKPHOS 58 10/27/2017 0908   BILITOT 0.7 06/05/2023 1341   BILITOT 0.62 10/27/2017 0908   GFRNONAA 53 (L) 06/05/2023 1341   GFRAA >60 07/15/2020 0847    No results found for: TOTALPROTELP, ALBUMINELP, A1GS, A2GS, BETS, BETA2SER, GAMS, MSPIKE, SPEI  No results found for: KPAFRELGTCHN, LAMBDASER, KAPLAMBRATIO  Lab Results  Component Value Date   WBC 6.0 12/07/2023   NEUTROABS 3.6 12/07/2023   HGB 14.1 12/07/2023    HCT 43.4 12/07/2023   MCV 91.4 12/07/2023   PLT 150 12/07/2023   Lab Results  Component Value Date   CA2729 61.6 (H) 07/05/2018  Urinalysis No results found for: COLORURINE, APPEARANCEUR, LABSPEC, PHURINE, GLUCOSEU, HGBUR, BILIRUBINUR, KETONESUR, PROTEINUR, UROBILINOGEN, NITRITE, LEUKOCYTESUR   STUDIES: No results found.   ELIGIBLE FOR AVAILABLE RESEARCH PROTOCOL: no  ASSESSMENT: 89 y.o. [place] woman status post left breast overlapping sites biopsy 06/22/2017 for a clinical T3 N1-2, stage II-III invasive ductal carcinoma, grade 2, estrogen and progesterone receptor positive, HER-2 nonamplified, with an MIB-1 of 30%  (a) staging CT of the chest and bone survey and bone scan July 11, 2017 showed multiple indeterminant subcentimeter pulmonary nodules which will require follow-up, as well as an indeterminate right hepatic lobe lesion, but no definite evidence of malignancy.  (b) CA-27-29 is informative  (1) neo-adjuvant fulvestrant  started on 07/05/2017, discontinued 12/20/2017 with possible progression  (2) status post left mastectomy and sentinel lymph node sampling 02/14/2018 for a T3 N1, stage IIA invasive ductal carcinoma, grade 1, with negative margins.  Repeat prognostic panel again estrogen and progesterone receptor positive, HER-2 negative  (a) 2 of 7 removed lymph nodes were positive  (3) adjuvant radiation to follow surgery  (4) anastrozole  started 01/17/2018  (a) start palbociclib once disease progression is documented  (5) METASTATIC DISEASE: April 2019  (a) CT scan of the chest 02/05/2018 shows a new lesion at T2  (b) total spinal MRI 03/14/2018 confirms lesion at T2 and possible minimal lesion at T8, neither amenable to biopsy; there are no other suspicious lesions  (c) total spinal MRI 07/02/2020 showed the treated lesion at T2, stable abnormal enhancement at T8 and perhaps slightly increased at T9.  There was resolution of  enhancement at T10, otherwise some evidence of degenerative disease.  Cervical and lumbar spines were negative  (d) chest CT on 902 2021 shows stable small pulmonary nodules, mild bronchiectasis, and aortic atherosclerosis but no measurable disease.  (6)  adjuvant radiation 05/01/2018 -06/15/2018  (a) received capecitabine  sensitization given the extent of local disease  (b) Site/dose:   The patient initially received a dose of 50.4 Gy in 28 fractions to the left chest wall and supraclavicular region. This was delivered using a 3-D conformal, 4 field technique. The patient then received a boost to the mastectomy scar. This delivered an additional 10 Gy in 5 fractions using an en face electron field. The total dose was 60.4 Gy  (c) stereotactic radiosurgery to T2 given 07/05/2018  (7) started zolendronate 07/11/2018, to be repeated every 12 weeks  (a) bone density 10/10/2019 shows a T score of -2.0  (b) bone density December 2022  (c) zolendronate changed to every 6 months after the 09/30/2020 dose   PLAN:  Biviana is here today for f/u of her metastatic breast cancer.    Breast Cancer History of left mastectomy with subsequent bone lesions in 2019, treated with radiation. No current evidence of active cancer. On long-term hormonal therapy with no reported issues. -Continue current hormonal therapy. -Plan to see patient annually, next apt in Feb 2026  Bone Health Long-term use of Zometa  for bone strengthening since 2019. -Reduce zometa  infusions to once a year due to lack of data for use beyond  2 yrs but given her history of osteopenia noted on bone density scan many years which is likely to progress while on anastrozole , it makes sense to continue Zometa  unless new dental problems arise.  Droopy Eyelids Patient reports droopy eyelids affecting vision, planning for surgical correction. -Continue with planned surgical correction as discussed with primary care physician and surgical  team.  General Health Maintenance -Continue annual mammogram. -Continue  MRI spine, next scheduled for April 2025. -Annual blood work, recent results satisfactory.  Total encounter time 30 minutes.* in face to face visit time, lab review, chart review, order entry and documentation of the encounter.   *Total Encounter Time as defined by the Centers for Medicare and Medicaid Services includes, in addition to the face-to-face time of a patient visit (documented in the note above) non-face-to-face time: obtaining and reviewing outside history, ordering and reviewing medications, tests or procedures, care coordination (communications with other health care professionals or caregivers) and documentation in the medical record.

## 2023-12-11 DIAGNOSIS — H53483 Generalized contraction of visual field, bilateral: Secondary | ICD-10-CM | POA: Diagnosis not present

## 2024-02-06 ENCOUNTER — Ambulatory Visit (HOSPITAL_COMMUNITY)
Admission: RE | Admit: 2024-02-06 | Discharge: 2024-02-06 | Disposition: A | Discharge status: Home / Self Care | Payer: Medicare Other | Source: Ambulatory Visit | Attending: Radiation Oncology | Admitting: Radiation Oncology

## 2024-02-06 DIAGNOSIS — C7951 Secondary malignant neoplasm of bone: Secondary | ICD-10-CM | POA: Insufficient documentation

## 2024-02-06 DIAGNOSIS — M5134 Other intervertebral disc degeneration, thoracic region: Secondary | ICD-10-CM | POA: Diagnosis not present

## 2024-02-06 DIAGNOSIS — M47814 Spondylosis without myelopathy or radiculopathy, thoracic region: Secondary | ICD-10-CM | POA: Diagnosis not present

## 2024-02-06 MED ORDER — GADOBUTROL 1 MMOL/ML IV SOLN
7.0000 mL | Freq: Once | INTRAVENOUS | Status: AC | PRN
  Administered 2024-02-06: 7 mL via INTRAVENOUS

## 2024-02-09 ENCOUNTER — Encounter: Payer: Self-pay | Admitting: Radiation Oncology

## 2024-02-09 NOTE — Progress Notes (Signed)
 Telephone nursing appointment for review of most recent MRI T-Spine results. I verified patient's identity x2 and began nursing interview.   Diagnosis:  Progressive metastatic stage II-III, T3 N0 M1 grade 1 ER/PR Positive invasive ductal carcinoma of the left breast with a T2 lesion(as documented in provider EOT note)  Patient reports doing well.  Patient denies chest pains, and any other related issues at this time.   Fatigue: No Skin changes to LT breast: No Pain improvement  LT breast due to TX: Yes  Dexamethasone: No  Weakness or loss of control of the extremities: No Headaches: No Symptoms of seizure or uncontrolled movement: No Vision: No Speech: No Confusion: No  All related body systems reviewed with patient.   Meaningful use complete.   Patient aware of their telephone appointment w/ Laurence Aly PA-C. I left my extension [phone] in case patient needs anything. Patient verbalized understanding. This concludes the nursing interview.   Patient preferred phone # [phone]   Ruel Favors, LPN

## 2024-02-12 ENCOUNTER — Inpatient Hospital Stay: Payer: Medicare Other

## 2024-02-12 ENCOUNTER — Ambulatory Visit
Admission: RE | Admit: 2024-02-12 | Discharge: 2024-02-12 | Disposition: A | Discharge status: Home / Self Care | Payer: Medicare Other | Source: Ambulatory Visit | Attending: Radiation Oncology | Admitting: Radiation Oncology

## 2024-02-12 VITALS — Ht 65.0 in | Wt 150.0 lb

## 2024-02-12 DIAGNOSIS — C50412 Malignant neoplasm of upper-outer quadrant of left female breast: Secondary | ICD-10-CM | POA: Diagnosis not present

## 2024-02-12 DIAGNOSIS — Z170 Estrogen receptor positive status [ER+]: Secondary | ICD-10-CM | POA: Diagnosis not present

## 2024-02-12 DIAGNOSIS — C7951 Secondary malignant neoplasm of bone: Secondary | ICD-10-CM

## 2024-02-12 NOTE — Progress Notes (Signed)
 Radiation Oncology         [phone] ________________________________   Outpatient Follow Up - Conducted via telephone at patient request.  I spoke with the patient to conduct this  visit via telephone. The patient was notified in advance and was offered an in person or telemedicine meeting to allow for face to face communication but instead preferred to proceed with a telephone visit. ________________________________  Name: Jessica Glenn MRN: [id number]  Date of Service: 02/12/2024  DOB: [birth year]  Follow Up Note  CC: Elias Else, MD (Inactive)  Elias Else, MD  Diagnosis: Progressive metastatic stage II-III, T3 N0 M1 grade 1 ER/PR Positive invasive ductal carcinoma of the left breast with a T2 lesion  Interval Since Last Radiation:    07/05/2018 Stereotactic Radiosurgery: T2 Spine was treated to 18 Gy in 1 fraction  05/01/2018 - 06/15/2018: The patient initially received a dose of 50.4 Gy in 28 fractions to the left chest wall and supraclavicular region. This was delivered using a 3-D conformal, 4 field technique. The patient then received a boost to the mastectomy scar. This delivered an additional 10 Gy in 5 fractions using an en face electron field. The total dose was 60.4 Gy.  Narrative: In summary the patient was diagnosed with stage II or stage III breast disease initially, and on staging imaging was found to have a lytic appearing lesion at T2.  She went on to proceed with mastectomy of the left breast followed by postmastectomy radiation to the left chest wall and regional nodes.  She then went on to receive stereotactic radiosurgery to the T-spine and one fraction.   She has been followed in surveillance and she has been NED.  Of note some degenerative changes along T8-9 have been identified in her scans and felt to be stable over time since her treatment.  She decided that she would prefer imaging serially at annual intervals rather than every 6 months as has been  previously offered.  Her most recent scan on 02/06/24 that showed stable changes in the T2 region as well as stable marrow signal at T7 and T8 and chronic degenerative and disc disease. She's contacted today to review these results by phone.                 On review of systems, the patient reports she is doing very well and has been having fun in her garden planting perennial plants. She denies any changes in sensation of her extremities, loss of control with movement, or with back pain. No other complaints are verbalized.   Past Medical History:  Past Medical History:  Diagnosis Date   Basal cell carcinoma    s/p Moh's surgery  to left side of nose   Basal cell carcinoma 08/30/2023   R upper lip, ED&C   Breast cancer, left breast (HCC) dx'd 05/2017   History of tobacco use    Squamous cell carcinoma of skin 11/18/2019   Left pretibial. KA-type. EDC   Varicose vein of leg    BLE    Past Surgical History: Past Surgical History:  Procedure Laterality Date   BREAST BIOPSY Left 05/2017   CATARACT EXTRACTION W/ INTRAOCULAR LENS  IMPLANT, BILATERAL Bilateral    DILATION AND CURETTAGE OF UTERUS     FRACTURE SURGERY     HARDWARE REMOVAL Right 02/14/2017   Procedure: RIGHT ANKLE HARDWARE REMOVAL;  Surgeon: Sheral Apley, MD;  Location: [facility];  Service: Orthopedics;  Laterality: Right;  I & D EXTREMITY Right 02/14/2017   Procedure: RIGHT IRRIGATION AND DEBRIDEMENT ANKLE;  Surgeon: Saundra Curl, MD;  Location: [facility];  Service: Orthopedics;  Laterality: Right;   MASTECTOMY Left 02/14/2018   LEFT MASTECTOMY WITH DEEP LEFT AXILLARY SENTINEL LYMPH NODE BIOPSY    MASTECTOMY W/ SENTINEL NODE BIOPSY Left 02/14/2018   Procedure: LEFT MASTECTOMY WITH SENTINEL LYMPH NODE BIOPSY AND POSSIBLE NODE DISSECTION;  Surgeon: Caralyn Chandler, MD;  Location: [facility];  Service: General;  Laterality: Left;   MOHS SURGERY Left    "side of nose"   ORIF ANKLE FRACTURE Right  02/02/2016   Procedure: OPEN REDUCTION INTERNAL FIXATION (ORIF) ANKLE FRACTURE;  Surgeon: Saundra Curl, MD;  Location: [facility];  Service: Orthopedics;  Laterality: Right;  strykermini c armreg bedbone foam    Social History:  Social History   Socioeconomic History   Marital status: Widowed    Spouse name: Not on file   Number of children: Not on file   Years of education: Not on file   Highest education level: Not on file  Occupational History   Not on file  Tobacco Use   Smoking status: Former    Current packs/day: 0.00    Average packs/day: 1 pack/day for 8.0 years (8.0 ttl pk-yrs)    Types: Cigarettes    Start date: 28    Quit date: 72    Years since quitting: 65.3   Smokeless tobacco: Never  Vaping Use   Vaping status: Never Used  Substance and Sexual Activity   Alcohol use: Yes    Alcohol/week: 5.0 standard drinks of alcohol    Types: 5 Glasses of wine per week   Drug use: No   Sexual activity: Not Currently  Other Topics Concern   Not on file  Social History Narrative   Not on file   Social Drivers of Health   Financial Resource Strain: Not on file  Food Insecurity: No Food Insecurity (02/09/2024)   Hunger Vital Sign    Worried About Running Out of Food in the Last Year: Never true    Ran Out of Food in the Last Year: Never true  Transportation Needs: No Transportation Needs (02/09/2024)   PRAPARE - Administrator, Civil Service (Medical): No    Lack of Transportation (Non-Medical): No  Physical Activity: Not on file  Stress: Not on file  Social Connections: Not on file  Intimate Partner Violence: Not At Risk (02/09/2024)   Humiliation, Afraid, Rape, and Kick questionnaire    Fear of Current or Ex-Partner: No    Emotionally Abused: No    Physically Abused: No    Sexually Abused: No  The patient is widowed.  She has 4 sons.   The patient lives in [place].  Family History: Family History  Problem Relation Age of  Onset   COPD Mother    Emphysema Mother      ALLERGIES:  has no known allergies.  Meds: Current Outpatient Medications  Medication Sig Dispense Refill   anastrozole (ARIMIDEX) 1 MG tablet TAKE 1 TABLET BY MOUTH DAILY 90 tablet 4   ascorbic acid (VITAMIN C) 1000 MG tablet Take 1,000 mg by mouth daily.     Calcium Carb-Cholecalciferol (CALCIUM 600 + D PO) Take 1 tablet by mouth daily.     Cholecalciferol (VITAMIN D3) 3000 units TABS Take 3,000 Units by mouth daily.     Cyanocobalamin (B-12) 5000 MCG CAPS Take 5,000 mcg  by mouth daily.     diphenhydramine-acetaminophen (TYLENOL PM EXTRA STRENGTH) 25-500 MG TABS tablet 1-2 tablets     magnesium oxide (MAG-OX) 400 (241.3 Mg) MG tablet Take 400 mg by mouth daily.     Multiple Vitamin (MULTIVITAMIN WITH MINERALS) TABS tablet Take 1 tablet by mouth daily.     thiamine (VITAMIN B-1) 50 MG tablet Take 50 mg by mouth daily.     Zoledronic Acid (ZOMETA IV) Inject into the vein.     No current facility-administered medications for this encounter.    Physical Findings: Unable to assess due to nature of encounter    Radiographic Findings: MR THORACIC SPINE W WO CONTRAST Result Date: 02/07/2024 CLINICAL DATA:  Follow-up spinal metastatic disease. History of breast cancer. EXAM: MRI THORACIC WITHOUT AND WITH CONTRAST TECHNIQUE: Multiplanar and multiecho pulse sequences of the thoracic spine were obtained without and with intravenous contrast. CONTRAST:  7mL GADAVIST GADOBUTROL 1 MMOL/ML IV SOLN COMPARISON:  02/02/2023 FINDINGS: Alignment:  No significant malalignment. Vertebrae: Stable appearance since the examination of last year. Post radiation fatty marrow changes in the upper thoracic region. Previously seen treated lesion of the posterior T2 vertebral body is stable without any finding to suggest residual viable tumor. Primarily this is T2 bright and well-circumscribed. Small focus of marrow signal abnormality in the right pedicle of T7 and within  the posterior left T8 vertebral body are stable. These are presumably benign. Mild degenerative enhancement of the endplates at T11-12 is noted, which could relate to regional pain but is not suspicious for metastatic disease. Cord:  No cord compression or focal cord lesion. Paraspinal and other soft tissues: Negative Disc levels: Chronic disc degeneration and Schmorl's node deformities as seen previously. Degenerative changes most notable at the T11-12 level where there is some endplate edema which could be associated with regional pain. Regional disc bulges are present but there is no sign of compressive narrowing of the canal or foramina. There is mild facet osteoarthritis also present at the T10-11 and T11-12 levels. IMPRESSION: 1. Stable appearance since the examination of last year. Treated lesion of the posterior T2 vertebral body is stable without any finding to suggest residual viable tumor. Small foci of marrow signal abnormality in the right pedicle of T7 and within the posterior left T8 vertebral body are stable. These are presumably benign. 2. Chronic disc degeneration and Schmorl's node deformities as seen previously. Degenerative changes most notable at the T11-12 level where there is some endplate edema which could be associated with regional pain. Regional disc bulges are present but there is no sign of compressive narrowing of the canal or foramina. There is mild facet osteoarthritis also present at the T10-11 and T11-12 levels. Electronically Signed   By: Paulina Fusi M.D.   On: 02/07/2024 15:44     Impression/Plan: 1. Progressive Metastatic Stage II-III [id number], grade 1, ER/PR positive invasive ductal carcinoma of the left breast with disease to the thoracic spine.  The patient is doing well clinically and plans to continue with Arimidex under the care of Dr. Al Pimple. She is contemplating eliminating the Zometa since she has done well and has been stable with her disease for years. As such,  the patient requests forgoing additional surveillance MRI scans of the spine. We discussed the pros and cons of this, and at the conclusion she is in agreement to keep [place] informed of any symptoms of pain or changes in physical function of the back or extremities and we will only resume surveillance  if she has symptoms or desires resuming her surveillance. She will continue to follow up with Dr. Arno Bibles annually.  This encounter was conducted via telephone.  The patient has provided two factor identification and has given verbal consent for this type of encounter and has been advised to only accept a meeting of this type in a secure network environment. The time spent during this encounter was 35 minutes including preparation, discussion, and coordination of the patient's care. The attendants for this meeting include  Bettejane Brownie  and Hulan Maffucci.  During the encounter,  Bettejane Brownie was located remotely at home. Hulan Maffucci was located at home.      Shelvia Dick, PAC

## 2024-02-23 DIAGNOSIS — F5101 Primary insomnia: Secondary | ICD-10-CM | POA: Diagnosis not present

## 2024-02-23 DIAGNOSIS — R030 Elevated blood-pressure reading, without diagnosis of hypertension: Secondary | ICD-10-CM | POA: Diagnosis not present

## 2024-02-23 DIAGNOSIS — I700 Atherosclerosis of aorta: Secondary | ICD-10-CM | POA: Diagnosis not present

## 2024-02-23 DIAGNOSIS — E7800 Pure hypercholesterolemia, unspecified: Secondary | ICD-10-CM | POA: Diagnosis not present

## 2024-02-23 DIAGNOSIS — C773 Secondary and unspecified malignant neoplasm of axilla and upper limb lymph nodes: Secondary | ICD-10-CM | POA: Diagnosis not present

## 2024-02-23 DIAGNOSIS — N1831 Chronic kidney disease, stage 3a: Secondary | ICD-10-CM | POA: Diagnosis not present

## 2024-07-18 ENCOUNTER — Ambulatory Visit: Payer: Medicare Other | Admitting: Dermatology

## 2024-07-30 ENCOUNTER — Telehealth: Payer: Self-pay | Admitting: *Deleted

## 2024-07-30 ENCOUNTER — Other Ambulatory Visit: Payer: Self-pay | Admitting: Hematology and Oncology

## 2024-07-30 NOTE — Telephone Encounter (Signed)
 Opened in error

## 2024-07-30 NOTE — Telephone Encounter (Signed)
 Confirmed with Dr. Loretha that patient will have her IV Zometa  from now on every February and will continue her daily oral hormonal therapy. Dr. Loretha requested scheduling make appt for the IV and visit with MD at the same time for February. Called patient and made her aware of above and she verbalized understanding.

## 2024-08-05 ENCOUNTER — Encounter: Payer: Self-pay | Admitting: Dermatology

## 2024-08-05 ENCOUNTER — Ambulatory Visit: Admitting: Dermatology

## 2024-08-05 DIAGNOSIS — Z85828 Personal history of other malignant neoplasm of skin: Secondary | ICD-10-CM

## 2024-08-05 DIAGNOSIS — L821 Other seborrheic keratosis: Secondary | ICD-10-CM

## 2024-08-05 DIAGNOSIS — D229 Melanocytic nevi, unspecified: Secondary | ICD-10-CM

## 2024-08-05 DIAGNOSIS — Z7189 Other specified counseling: Secondary | ICD-10-CM

## 2024-08-05 DIAGNOSIS — Z1283 Encounter for screening for malignant neoplasm of skin: Secondary | ICD-10-CM | POA: Diagnosis not present

## 2024-08-05 DIAGNOSIS — L578 Other skin changes due to chronic exposure to nonionizing radiation: Secondary | ICD-10-CM

## 2024-08-05 DIAGNOSIS — Z853 Personal history of malignant neoplasm of breast: Secondary | ICD-10-CM

## 2024-08-05 DIAGNOSIS — D692 Other nonthrombocytopenic purpura: Secondary | ICD-10-CM | POA: Diagnosis not present

## 2024-08-05 DIAGNOSIS — Z8589 Personal history of malignant neoplasm of other organs and systems: Secondary | ICD-10-CM

## 2024-08-05 DIAGNOSIS — L814 Other melanin hyperpigmentation: Secondary | ICD-10-CM

## 2024-08-05 DIAGNOSIS — W908XXA Exposure to other nonionizing radiation, initial encounter: Secondary | ICD-10-CM

## 2024-08-05 DIAGNOSIS — L810 Postinflammatory hyperpigmentation: Secondary | ICD-10-CM

## 2024-08-05 NOTE — Patient Instructions (Addendum)
 Purpura - Chronic; persistent and recurrent.  Treatable, but not curable. - Violaceous macules and patches - Benign - Related to trauma, age, sun damage and/or use of blood thinners, chronic use of topical and/or oral steroids - Observe - Can use OTC arnica containing moisturizer such as Dermend Bruise Formula if desired - Call for worsening or other concerns  Due to recent changes in healthcare laws, you may see results of your pathology and/or laboratory studies on MyChart before the doctors have had a chance to review them. We understand that in some cases there may be results that are confusing or concerning to you. Please understand that not all results are received at the same time and often the doctors may need to interpret multiple results in order to provide you with the best plan of care or course of treatment. Therefore, we ask that you please give us  2 business days to thoroughly review all your results before contacting the office for clarification. Should we see a critical lab result, you will be contacted sooner.   If You Need Anything After Your Visit  If you have any questions or concerns for your doctor, please call our main line at [phone] and press option 4 to reach your doctor's medical assistant. If no one answers, please leave a voicemail as directed and we will return your call as soon as possible. Messages left after 4 pm will be answered the following business day.   You may also send us  a message via MyChart. We typically respond to MyChart messages within 1-2 business days.  For prescription refills, please ask your pharmacy to contact our office. Our fax number is [phone].  If you have an urgent issue when the clinic is closed that cannot wait until the next business day, you can page your doctor at the number below.    Please note that while we do our best to be available for urgent issues outside of office hours, we are not available 24/7.   If you  have an urgent issue and are unable to reach us , you may choose to seek medical care at your doctor's office, retail clinic, urgent care center, or emergency room.  If you have a medical emergency, please immediately call 911 or go to the emergency department.  Pager Numbers  - Dr. Hester: [phone]  - Dr. Jackquline: [phone]  - Dr. Claudene: [phone]   - Dr. Raymund: [phone]  In the event of inclement weather, please call our main line at [phone] for an update on the status of any delays or closures.  Dermatology Medication Tips: Please keep the boxes that topical medications come in in order to help keep track of the instructions about where and how to use these. Pharmacies typically print the medication instructions only on the boxes and not directly on the medication tubes.   If your medication is too expensive, please contact our office at [phone] option 4 or send us  a message through MyChart.   We are unable to tell what your co-pay for medications will be in advance as this is different depending on your insurance coverage. However, we may be able to find a substitute medication at lower cost or fill out paperwork to get insurance to cover a needed medication.   If a prior authorization is required to get your medication covered by your insurance company, please allow us  1-2 business days to complete this process.  Drug prices often vary depending on where the prescription is filled and  some pharmacies may offer cheaper prices.  The website www.goodrx.com contains coupons for medications through different pharmacies. The prices here do not account for what the cost may be with help from insurance (it may be cheaper with your insurance), but the website can give you the price if you did not use any insurance.  - You can print the associated coupon and take it with your prescription to the pharmacy.  - You may also stop by our office during regular business  hours and pick up a GoodRx coupon card.  - If you need your prescription sent electronically to a different pharmacy, notify our office through [facility] or by phone at [phone] option 4.     Si Usted Necesita Algo Despus de Su Visita  Tambin puede enviarnos un mensaje a travs de Clinical cytogeneticist. Por lo general respondemos a los mensajes de MyChart en el transcurso de 1 a 2 das hbiles.  Para renovar recetas, por favor pida a su farmacia que se ponga en contacto con nuestra oficina. Randi lakes de fax es [place] [phone].  Si tiene un asunto urgente cuando la clnica est cerrada y que no puede esperar hasta el siguiente da hbil, puede llamar/localizar a su doctor(a) al nmero que aparece a continuacin.   Por favor, tenga en cuenta que aunque hacemos todo lo posible para estar disponibles para asuntos urgentes fuera del horario de [place], no estamos disponibles las 24 horas del da, los 7 [address]  [address] de la [place].   Si tiene un problema urgente y no puede comunicarse con nosotros, puede optar por buscar atencin mdica  en el consultorio de su doctor(a), en una clnica privada, en un centro de atencin urgente o en una sala de emergencias.  Si tiene Engineer, drilling, por favor llame inmediatamente al 911 o vaya a la sala de emergencias.  Nmeros de bper  - Dr. Hester: [phone]  - Dra. Jackquline: [phone]  - Dr. Claudene: [phone]  - Dra. Kitts: [phone]  En caso de inclemencias del [place], por favor llame a nuestra lnea principal al [phone] para una actualizacin sobre el estado de cualquier retraso o cierre.  Consejos para la medicacin en dermatologa: Por favor, guarde las cajas en las que vienen los medicamentos de uso tpico para ayudarle a seguir las instrucciones sobre dnde y cmo usarlos. Las farmacias generalmente imprimen las instrucciones del medicamento slo en las cajas y no directamente en los tubos del [place].   Si su  medicamento es muy caro, por favor, pngase en contacto con landry rieger llamando al [phone] y presione la opcin 4 o envenos un mensaje a travs de Clinical cytogeneticist.   No podemos decirle cul ser su copago por los medicamentos por adelantado ya que esto es diferente dependiendo de la cobertura de su seguro. Sin embargo, es posible que podamos encontrar un medicamento sustituto a Audiological scientist un formulario para que el seguro cubra el medicamento que se considera necesario.   Si se requiere una autorizacin previa para que su compaa de seguros malta su medicamento, por favor permtanos de 1 a 2 das hbiles para completar este proceso.  Los precios de los medicamentos varan con frecuencia dependiendo del Environmental consultant de dnde se surte la receta y alguna farmacias pueden ofrecer precios ms baratos.  El sitio web www.goodrx.com tiene cupones para medicamentos de Health and safety inspector. Los precios aqu no tienen en cuenta lo que podra costar con la ayuda del seguro (puede ser ms barato con su seguro), pero el sitio  web puede darle el precio si no Visual merchandiser.  - Puede imprimir el cupn correspondiente y llevarlo con su receta a la farmacia.  - Tambin puede pasar por nuestra oficina durante el horario de atencin regular y Education officer, museum una tarjeta de cupones de GoodRx.  - Si necesita que su receta se enve electrnicamente a una farmacia diferente, informe a nuestra oficina a travs de MyChart de [place] o por telfono llamando al [phone] y presione la opcin 4.

## 2024-08-05 NOTE — Progress Notes (Signed)
 Follow-Up Visit   Subjective  Jessica Glenn is a 89 y.o. female who presents for the following: Skin Cancer Screening and Full Body Skin Exam  The patient presents for Total-Body Skin Exam (TBSE) for skin cancer screening and mole check. The patient has spots, moles and lesions to be evaluated, some may be new or changing and the patient may have concern these could be cancer.  The following portions of the chart were reviewed this encounter and updated as appropriate: medications, allergies, medical history  Review of Systems:  No other skin or systemic complaints except as noted in HPI or Assessment and Plan.  Objective  Well appearing patient in no apparent distress; mood and affect are within normal limits.  A full examination was performed including scalp, head, eyes, ears, nose, lips, neck, chest, axillae, abdomen, back, buttocks, bilateral upper extremities, bilateral lower extremities, hands, feet, fingers, toes, fingernails, and toenails. All findings within normal limits unless otherwise noted below.   Relevant physical exam findings are noted in the Assessment and Plan.    Assessment & Plan   SKIN CANCER SCREENING PERFORMED TODAY.  ACTINIC DAMAGE - Chronic condition, secondary to cumulative UV/sun exposure - diffuse scaly erythematous macules with underlying dyspigmentation - Recommend daily broad spectrum sunscreen SPF 30+ to sun-exposed areas, reapply every 2 hours as needed.  - Staying in the shade or wearing long sleeves, sun glasses (UVA+UVB protection) and wide brim hats (4-inch brim around the entire circumference of the hat) are also recommended for sun protection.  - Call for new or changing lesions.  LENTIGINES, SEBORRHEIC KERATOSES, HEMANGIOMAS - Benign normal skin lesions - Benign-appearing - Call for any changes  MELANOCYTIC NEVI - Tan-brown and/or pink-flesh-colored symmetric macules and papules - Benign appearing on exam today - Observation - Call  clinic for new or changing moles - Recommend daily use of broad spectrum spf 30+ sunscreen to sun-exposed areas.   HISTORY OF BASAL CELL CARCINOMA OF THE SKIN - No evidence of recurrence today - Recommend regular full body skin exams - Recommend daily broad spectrum sunscreen SPF 30+ to sun-exposed areas, reapply every 2 hours as needed.  - Call if any new or changing lesions are noted between office visits  HISTORY OF SQUAMOUS CELL CARCINOMA OF THE SKIN - No evidence of recurrence today - No lymphadenopathy - Recommend regular full body skin exams - Recommend daily broad spectrum sunscreen SPF 30+ to sun-exposed areas, reapply every 2 hours as needed.  - Call if any new or changing lesions are noted between office visits  Purpura - Chronic; persistent and recurrent.  Treatable, but not curable. - Violaceous macules and patches - Benign - Related to trauma, age, sun damage and/or use of blood thinners, chronic use of topical and/or oral steroids - Observe - Can use OTC arnica containing moisturizer such as Dermend Bruise Formula if desired - Call for worsening or other concerns  L breast mastectomy - surgery and radiation, currently on anastrozole , no LAD  POST-INFLAMMATORY HYPERPIGMENTATION (PIH) Exam: hyperpigmented macules and/or patches at lower legs Post-inflammatory hyperpimentation (PIH)  is a benign condition that comes from having previous inflammation in the skin and will fade with time over months to sometimes years. Recommend daily sun protection including sunscreen SPF 30+ to sun-exposed areas. - Recommend treating any itchy or red areas on the skin quickly to prevent new areas of PIH. Once rash has cleared, treating with prescription medicines such as hydroquinone may help fade dark spots faster.  Treatment Plan:  None needed, may fade over time.  Return in about 1 year (around 08/05/2025) for TBSE - hx BCC, SCC.  I, Rosina Mayans, CMA, am acting as scribe for Alm Rhyme, MD .   Documentation: I have reviewed the above documentation for accuracy and completeness, and I agree with the above.  Alm Rhyme, MD

## 2024-10-29 ENCOUNTER — Ambulatory Visit

## 2024-10-29 DIAGNOSIS — L853 Xerosis cutis: Secondary | ICD-10-CM

## 2024-10-29 DIAGNOSIS — W908XXA Exposure to other nonionizing radiation, initial encounter: Secondary | ICD-10-CM

## 2024-10-29 DIAGNOSIS — Z85828 Personal history of other malignant neoplasm of skin: Secondary | ICD-10-CM | POA: Diagnosis not present

## 2024-10-29 DIAGNOSIS — L578 Other skin changes due to chronic exposure to nonionizing radiation: Secondary | ICD-10-CM

## 2024-10-29 DIAGNOSIS — T148XXA Other injury of unspecified body region, initial encounter: Secondary | ICD-10-CM

## 2024-10-29 DIAGNOSIS — S81801A Unspecified open wound, right lower leg, initial encounter: Secondary | ICD-10-CM

## 2024-10-29 DIAGNOSIS — L810 Postinflammatory hyperpigmentation: Secondary | ICD-10-CM | POA: Diagnosis not present

## 2024-10-29 DIAGNOSIS — L570 Actinic keratosis: Secondary | ICD-10-CM

## 2024-10-29 DIAGNOSIS — Z85820 Personal history of malignant melanoma of skin: Secondary | ICD-10-CM

## 2024-10-29 NOTE — Progress Notes (Signed)
 "   Subjective   Jessica Glenn is a 89 y.o. female who presents for the following: Lesion(s) of concern . Patient is established patient   Today patient reports: Area of concern on the right lower extremity. States hit on box recently and wants to make sure healing appropriately.  Also w/ spot on nose.  History of skin cancer s/p mohs.   Review of Systems:    No other skin or systemic complaints except as noted in HPI or Assessment and Plan.  The following portions of the chart were reviewed this encounter and updated as appropriate: medications, allergies, medical history  Relevant Medical History:  Personal history of non melanoma skin cancer - see medical history for full details   Objective  (SKPE) Well appearing patient in no apparent distress; mood and affect are within normal limits. Examination was performed of the: Focused Exam of: Right lower extremity   Examination notable for: PIH and xerosis s/p skin tear on the right lower extremity   Scaly papule R nasal bridge  Examination limited by: Clothing and Patient deferred removal     Right Nasal Bridge Pink scaly macules  Assessment & Plan  (SKAP)   S/p traumatic wound - anterior right lower extremity - healing well today with xerosis, pih  - Recommend elevation and compression stockings - Recommend Vaseline for moisture   ACTINIC DAMAGE - Chronic condition, secondary to cumulative UV/sun exposure - Recommend daily broad spectrum sunscreen SPF 30+ to sun-exposed areas, reapply every 2 hours as needed.  - Staying in the shade or wearing long sleeves, sun glasses (UVA+UVB protection) and wide brim hats (4-inch brim around the entire circumference of the hat) are also recommended for sun protection.  - Call for new or changing lesions.  Personal history of non melanoma skin cancer  - Reviewed medical history for full details  - Reviewed sun protective measures as above - Encouraged full body skin exams   Was sun  protection counseling provided?: Yes   Level of service outlined above   Patient instructions (SKPI)   Procedures, orders, diagnosis for this visit:  ACTINIC KERATOSIS Right Nasal Bridge Actinic keratoses are precancerous spots that appear secondary to cumulative UV radiation exposure/sun exposure over time. They are chronic with expected duration over 1 year. A portion of actinic keratoses will progress to squamous cell carcinoma of the skin. It is not possible to reliably predict which spots will progress to skin cancer and so treatment is recommended to prevent development of skin cancer.  Recommend daily broad spectrum sunscreen SPF 30+ to sun-exposed areas, reapply every 2 hours as needed.  Recommend staying in the shade or wearing long sleeves, sun glasses (UVA+UVB protection) and wide brim hats (4-inch brim around the entire circumference of the hat). Call for new or changing lesions. - Destruction of lesion - Right Nasal Bridge Complexity: simple   Destruction method: cryotherapy   Informed consent: discussed and consent obtained   Timeout:  patient name, date of birth, surgical site, and procedure verified Lesion destroyed using liquid nitrogen: Yes   Region frozen until ice ball extended beyond lesion: Yes   Cryo cycles: 1 or 2. Outcome: patient tolerated procedure well with no complications   Post-procedure details: wound care instructions given     Actinic keratosis -     Destruction of lesion    Return to clinic: Return if symptoms worsen or fail to improve.  I, Emerick Ege, CMA am acting as neurosurgeon for [employer],  MD.   Documentation: I have reviewed the above documentation for accuracy and completeness, and I agree with the above.  Lauraine JAYSON Kanaris, MD  "

## 2024-10-29 NOTE — Patient Instructions (Signed)

## 2024-11-11 ENCOUNTER — Other Ambulatory Visit: Payer: Self-pay | Admitting: Hematology and Oncology

## 2024-12-05 ENCOUNTER — Telehealth: Payer: Self-pay | Admitting: Hematology and Oncology

## 2024-12-05 ENCOUNTER — Other Ambulatory Visit: Payer: Self-pay

## 2024-12-05 DIAGNOSIS — C7951 Secondary malignant neoplasm of bone: Secondary | ICD-10-CM

## 2024-12-05 NOTE — Telephone Encounter (Signed)
 I returned patient's voicemail requesting to reschedule lab, MD, and [facility]  from 12/06/2024 to 12/26/2024.

## 2024-12-06 ENCOUNTER — Inpatient Hospital Stay

## 2024-12-06 ENCOUNTER — Inpatient Hospital Stay: Admitting: Hematology and Oncology

## 2024-12-26 ENCOUNTER — Inpatient Hospital Stay

## 2024-12-26 ENCOUNTER — Inpatient Hospital Stay: Admitting: Hematology and Oncology

## 2025-08-06 ENCOUNTER — Ambulatory Visit: Admitting: Dermatology
